# Patient Record
Sex: Female | Born: 1950 | Race: White | Hispanic: No | Smoking: Former smoker
Health system: Southern US, Community
[De-identification: ages and names within clinical notes are randomized; demographics above are authoritative.]

## PROBLEM LIST (undated history)

## (undated) DIAGNOSIS — E785 Hyperlipidemia, unspecified: Secondary | ICD-10-CM

## (undated) DIAGNOSIS — R569 Unspecified convulsions: Secondary | ICD-10-CM

## (undated) DIAGNOSIS — I619 Nontraumatic intracerebral hemorrhage, unspecified: Secondary | ICD-10-CM

## (undated) DIAGNOSIS — J45909 Unspecified asthma, uncomplicated: Secondary | ICD-10-CM

## (undated) DIAGNOSIS — E119 Type 2 diabetes mellitus without complications: Secondary | ICD-10-CM

## (undated) DIAGNOSIS — I639 Cerebral infarction, unspecified: Secondary | ICD-10-CM

## (undated) DIAGNOSIS — I10 Essential (primary) hypertension: Secondary | ICD-10-CM

## (undated) HISTORY — PX: BRAIN SURGERY: SHX531

## (undated) HISTORY — PX: CERVICAL FUSION: SHX112

---

## 2003-11-25 ENCOUNTER — Emergency Department: Payer: Self-pay | Admitting: Emergency Medicine

## 2003-11-25 ENCOUNTER — Ambulatory Visit: Payer: Self-pay | Admitting: Physician Assistant

## 2003-12-18 ENCOUNTER — Ambulatory Visit: Payer: Self-pay | Admitting: Pain Medicine

## 2003-12-26 ENCOUNTER — Ambulatory Visit: Payer: Self-pay | Admitting: Physician Assistant

## 2003-12-30 ENCOUNTER — Ambulatory Visit: Payer: Self-pay | Admitting: Pain Medicine

## 2004-02-18 ENCOUNTER — Ambulatory Visit: Payer: Self-pay | Admitting: Physician Assistant

## 2004-03-19 ENCOUNTER — Ambulatory Visit: Payer: Self-pay | Admitting: Pain Medicine

## 2004-04-02 ENCOUNTER — Ambulatory Visit: Payer: Self-pay | Admitting: Pain Medicine

## 2004-04-14 ENCOUNTER — Ambulatory Visit: Payer: Self-pay | Admitting: Pain Medicine

## 2004-04-27 ENCOUNTER — Ambulatory Visit: Payer: Self-pay | Admitting: Pain Medicine

## 2004-04-30 ENCOUNTER — Ambulatory Visit: Payer: Self-pay | Admitting: Pain Medicine

## 2004-05-04 ENCOUNTER — Ambulatory Visit: Payer: Self-pay | Admitting: Pain Medicine

## 2004-05-07 ENCOUNTER — Ambulatory Visit: Payer: Self-pay | Admitting: Pain Medicine

## 2004-05-25 ENCOUNTER — Ambulatory Visit: Payer: Self-pay | Admitting: Pain Medicine

## 2004-06-29 ENCOUNTER — Ambulatory Visit: Payer: Self-pay | Admitting: Pain Medicine

## 2004-08-05 ENCOUNTER — Ambulatory Visit: Payer: Self-pay | Admitting: Physician Assistant

## 2004-08-28 ENCOUNTER — Ambulatory Visit: Payer: Self-pay | Admitting: Physician Assistant

## 2004-10-01 ENCOUNTER — Ambulatory Visit: Payer: Self-pay | Admitting: Physician Assistant

## 2004-11-02 ENCOUNTER — Ambulatory Visit: Payer: Self-pay | Admitting: Physician Assistant

## 2004-11-30 ENCOUNTER — Ambulatory Visit: Payer: Self-pay | Admitting: Physician Assistant

## 2004-12-29 ENCOUNTER — Ambulatory Visit: Payer: Self-pay | Admitting: Physician Assistant

## 2005-01-27 ENCOUNTER — Ambulatory Visit: Payer: Self-pay | Admitting: Physician Assistant

## 2005-03-02 ENCOUNTER — Ambulatory Visit: Payer: Self-pay | Admitting: Physician Assistant

## 2005-04-06 ENCOUNTER — Ambulatory Visit: Payer: Self-pay | Admitting: Physician Assistant

## 2005-05-04 ENCOUNTER — Ambulatory Visit: Payer: Self-pay | Admitting: Physician Assistant

## 2005-06-02 ENCOUNTER — Ambulatory Visit: Payer: Self-pay | Admitting: Physician Assistant

## 2005-06-10 ENCOUNTER — Ambulatory Visit: Payer: Self-pay | Admitting: Pain Medicine

## 2005-06-17 ENCOUNTER — Ambulatory Visit: Payer: Self-pay | Admitting: Pain Medicine

## 2005-06-24 ENCOUNTER — Ambulatory Visit: Payer: Self-pay | Admitting: Pain Medicine

## 2005-07-27 ENCOUNTER — Ambulatory Visit: Payer: Self-pay | Admitting: Physician Assistant

## 2005-08-10 ENCOUNTER — Other Ambulatory Visit: Payer: Self-pay

## 2005-08-10 ENCOUNTER — Ambulatory Visit: Payer: Self-pay | Admitting: General Surgery

## 2005-08-13 ENCOUNTER — Ambulatory Visit: Payer: Self-pay | Admitting: General Surgery

## 2005-08-19 ENCOUNTER — Ambulatory Visit: Payer: Self-pay | Admitting: Pain Medicine

## 2005-08-30 ENCOUNTER — Ambulatory Visit: Payer: Self-pay | Admitting: Physician Assistant

## 2005-09-21 ENCOUNTER — Ambulatory Visit: Payer: Self-pay | Admitting: Pain Medicine

## 2005-10-07 ENCOUNTER — Ambulatory Visit: Payer: Self-pay | Admitting: Pain Medicine

## 2005-10-21 ENCOUNTER — Ambulatory Visit: Payer: Self-pay | Admitting: Pain Medicine

## 2005-11-16 ENCOUNTER — Ambulatory Visit: Payer: Self-pay | Admitting: Pain Medicine

## 2005-11-23 ENCOUNTER — Ambulatory Visit: Payer: Self-pay | Admitting: Pain Medicine

## 2005-12-02 ENCOUNTER — Ambulatory Visit: Payer: Self-pay | Admitting: Pain Medicine

## 2005-12-14 ENCOUNTER — Ambulatory Visit: Payer: Self-pay | Admitting: Pain Medicine

## 2005-12-23 ENCOUNTER — Ambulatory Visit: Payer: Self-pay | Admitting: Pain Medicine

## 2006-01-12 ENCOUNTER — Ambulatory Visit: Payer: Self-pay | Admitting: Physician Assistant

## 2006-01-18 ENCOUNTER — Ambulatory Visit: Payer: Self-pay | Admitting: Pain Medicine

## 2006-02-28 ENCOUNTER — Ambulatory Visit: Payer: Self-pay | Admitting: Pain Medicine

## 2006-03-28 ENCOUNTER — Ambulatory Visit: Payer: Self-pay | Admitting: Physician Assistant

## 2006-03-29 ENCOUNTER — Ambulatory Visit: Payer: Self-pay | Admitting: Physician Assistant

## 2006-04-07 ENCOUNTER — Ambulatory Visit: Payer: Self-pay | Admitting: Pain Medicine

## 2006-04-27 ENCOUNTER — Ambulatory Visit: Payer: Self-pay | Admitting: Physician Assistant

## 2006-05-26 ENCOUNTER — Ambulatory Visit: Payer: Self-pay | Admitting: Physician Assistant

## 2006-06-02 ENCOUNTER — Ambulatory Visit: Payer: Self-pay | Admitting: Pain Medicine

## 2006-06-09 ENCOUNTER — Ambulatory Visit: Payer: Self-pay | Admitting: Pain Medicine

## 2006-06-23 ENCOUNTER — Ambulatory Visit: Payer: Self-pay | Admitting: Pain Medicine

## 2006-06-30 ENCOUNTER — Ambulatory Visit: Payer: Self-pay | Admitting: Pain Medicine

## 2006-07-07 ENCOUNTER — Ambulatory Visit: Payer: Self-pay | Admitting: Pain Medicine

## 2006-07-26 ENCOUNTER — Ambulatory Visit: Payer: Self-pay | Admitting: Physician Assistant

## 2006-08-17 ENCOUNTER — Ambulatory Visit: Payer: Self-pay | Admitting: Physician Assistant

## 2006-08-17 ENCOUNTER — Ambulatory Visit: Payer: Self-pay | Admitting: Orthopedic Surgery

## 2006-09-26 ENCOUNTER — Ambulatory Visit: Payer: Self-pay | Admitting: Physician Assistant

## 2006-10-27 ENCOUNTER — Ambulatory Visit: Payer: Self-pay | Admitting: Physician Assistant

## 2006-11-10 ENCOUNTER — Ambulatory Visit: Payer: Self-pay | Admitting: Pain Medicine

## 2006-11-28 ENCOUNTER — Ambulatory Visit: Payer: Self-pay | Admitting: Physician Assistant

## 2006-12-01 ENCOUNTER — Ambulatory Visit: Payer: Self-pay | Admitting: Pain Medicine

## 2006-12-20 ENCOUNTER — Ambulatory Visit: Payer: Self-pay | Admitting: Physician Assistant

## 2007-01-03 ENCOUNTER — Ambulatory Visit: Payer: Self-pay | Admitting: Pain Medicine

## 2007-01-19 ENCOUNTER — Ambulatory Visit: Payer: Self-pay | Admitting: Pain Medicine

## 2007-01-26 ENCOUNTER — Ambulatory Visit: Payer: Self-pay | Admitting: Pain Medicine

## 2007-02-16 ENCOUNTER — Ambulatory Visit: Payer: Self-pay | Admitting: Pain Medicine

## 2007-03-16 ENCOUNTER — Ambulatory Visit: Payer: Self-pay | Admitting: Pain Medicine

## 2007-04-13 ENCOUNTER — Ambulatory Visit: Payer: Self-pay | Admitting: Physician Assistant

## 2007-05-16 ENCOUNTER — Ambulatory Visit: Payer: Self-pay | Admitting: Physician Assistant

## 2007-06-14 ENCOUNTER — Ambulatory Visit: Payer: Self-pay | Admitting: Physician Assistant

## 2007-07-12 ENCOUNTER — Ambulatory Visit: Payer: Self-pay | Admitting: Physician Assistant

## 2007-08-09 ENCOUNTER — Ambulatory Visit: Payer: Self-pay | Admitting: Physician Assistant

## 2007-08-17 ENCOUNTER — Ambulatory Visit: Payer: Self-pay | Admitting: Pain Medicine

## 2007-08-29 ENCOUNTER — Ambulatory Visit: Payer: Self-pay | Admitting: Pain Medicine

## 2007-09-05 ENCOUNTER — Ambulatory Visit: Payer: Self-pay | Admitting: Pain Medicine

## 2007-09-14 ENCOUNTER — Ambulatory Visit: Payer: Self-pay | Admitting: Pain Medicine

## 2007-09-28 ENCOUNTER — Ambulatory Visit: Payer: Self-pay | Admitting: Pain Medicine

## 2007-10-18 ENCOUNTER — Ambulatory Visit: Payer: Self-pay | Admitting: Pain Medicine

## 2007-11-06 ENCOUNTER — Ambulatory Visit: Payer: Self-pay | Admitting: Pain Medicine

## 2007-11-08 ENCOUNTER — Ambulatory Visit: Payer: Self-pay | Admitting: Pain Medicine

## 2007-11-16 ENCOUNTER — Ambulatory Visit: Payer: Self-pay | Admitting: Pain Medicine

## 2007-11-28 ENCOUNTER — Ambulatory Visit: Payer: Self-pay | Admitting: Pain Medicine

## 2007-12-13 ENCOUNTER — Ambulatory Visit: Payer: Self-pay | Admitting: Pain Medicine

## 2008-01-11 ENCOUNTER — Ambulatory Visit: Payer: Self-pay | Admitting: Physician Assistant

## 2008-01-15 ENCOUNTER — Ambulatory Visit: Payer: Self-pay | Admitting: Pain Medicine

## 2008-03-06 ENCOUNTER — Ambulatory Visit: Payer: Self-pay | Admitting: Pain Medicine

## 2008-04-02 ENCOUNTER — Ambulatory Visit: Payer: Self-pay | Admitting: Physician Assistant

## 2008-04-25 ENCOUNTER — Ambulatory Visit: Payer: Self-pay | Admitting: Physician Assistant

## 2008-06-03 ENCOUNTER — Ambulatory Visit: Payer: Self-pay | Admitting: Physician Assistant

## 2008-07-30 ENCOUNTER — Ambulatory Visit: Payer: Self-pay | Admitting: Pain Medicine

## 2008-08-13 ENCOUNTER — Ambulatory Visit: Payer: Self-pay | Admitting: Pain Medicine

## 2008-08-21 ENCOUNTER — Ambulatory Visit: Payer: Self-pay | Admitting: Pain Medicine

## 2008-08-27 ENCOUNTER — Ambulatory Visit: Payer: Self-pay | Admitting: Pain Medicine

## 2008-09-03 ENCOUNTER — Ambulatory Visit: Payer: Self-pay | Admitting: Pain Medicine

## 2008-09-10 ENCOUNTER — Ambulatory Visit: Payer: Self-pay | Admitting: Pain Medicine

## 2008-10-02 ENCOUNTER — Ambulatory Visit: Payer: Self-pay | Admitting: Physician Assistant

## 2008-11-04 ENCOUNTER — Ambulatory Visit: Payer: Self-pay | Admitting: Pain Medicine

## 2008-11-12 ENCOUNTER — Ambulatory Visit: Payer: Self-pay | Admitting: Pain Medicine

## 2008-12-04 ENCOUNTER — Ambulatory Visit: Payer: Self-pay | Admitting: Physician Assistant

## 2008-12-17 ENCOUNTER — Ambulatory Visit: Payer: Self-pay | Admitting: Pain Medicine

## 2009-01-07 ENCOUNTER — Ambulatory Visit: Payer: Self-pay | Admitting: Physician Assistant

## 2009-03-10 ENCOUNTER — Ambulatory Visit: Payer: Self-pay | Admitting: Pain Medicine

## 2009-04-01 ENCOUNTER — Ambulatory Visit: Payer: Self-pay | Admitting: Pain Medicine

## 2009-04-28 ENCOUNTER — Ambulatory Visit: Payer: Self-pay | Admitting: Pain Medicine

## 2009-08-07 ENCOUNTER — Ambulatory Visit: Payer: Self-pay | Admitting: Pain Medicine

## 2009-08-14 ENCOUNTER — Ambulatory Visit: Payer: Self-pay | Admitting: Pain Medicine

## 2010-04-08 ENCOUNTER — Emergency Department: Payer: Self-pay | Admitting: Emergency Medicine

## 2010-04-09 ENCOUNTER — Emergency Department: Payer: Self-pay | Admitting: Emergency Medicine

## 2010-04-10 ENCOUNTER — Emergency Department: Payer: Self-pay | Admitting: Emergency Medicine

## 2010-04-17 ENCOUNTER — Inpatient Hospital Stay: Payer: Self-pay | Admitting: Internal Medicine

## 2010-04-18 DIAGNOSIS — R4182 Altered mental status, unspecified: Secondary | ICD-10-CM

## 2010-04-18 DIAGNOSIS — R7989 Other specified abnormal findings of blood chemistry: Secondary | ICD-10-CM

## 2010-04-18 DIAGNOSIS — I10 Essential (primary) hypertension: Secondary | ICD-10-CM

## 2010-04-21 DIAGNOSIS — J9600 Acute respiratory failure, unspecified whether with hypoxia or hypercapnia: Secondary | ICD-10-CM

## 2010-11-09 ENCOUNTER — Ambulatory Visit: Payer: Self-pay | Admitting: Pain Medicine

## 2011-05-30 ENCOUNTER — Ambulatory Visit: Payer: Self-pay

## 2011-08-03 ENCOUNTER — Emergency Department: Payer: Self-pay | Admitting: *Deleted

## 2011-08-03 LAB — BASIC METABOLIC PANEL
Creatinine: 0.97 mg/dL (ref 0.60–1.30)
EGFR (African American): >60
Osmolality: 291 (ref 275–301)
Sodium: 143 mmol/L (ref 136–145)

## 2011-08-03 LAB — CBC
MCH: 32.8 pg (ref 26.0–34.0)
MCHC: 33.7 g/dL (ref 32.0–36.0)
RBC: 4.43 10*6/uL (ref 3.80–5.20)
RDW: 13 % (ref 11.5–14.5)
WBC: 9.2 10*3/uL (ref 3.6–11.0)

## 2011-08-03 LAB — TROPONIN I: Troponin-I: <0.02 ng/mL

## 2011-08-03 LAB — CK TOTAL AND CKMB (NOT AT ARMC): CK, Total: 49 U/L (ref 21–215)

## 2011-08-04 LAB — CK TOTAL AND CKMB (NOT AT ARMC): CK, Total: 53 U/L (ref 21–215)

## 2011-08-04 LAB — ETHANOL
Ethanol %: <0.003 % (ref 0.000–0.080)
Ethanol: <3 mg/dL

## 2011-08-04 LAB — TROPONIN I: Troponin-I: <0.02 ng/mL

## 2011-08-05 LAB — ACETAMINOPHEN LEVEL: Acetaminophen: <2 ug/mL

## 2011-08-05 LAB — DRUG SCREEN, URINE
Barbiturates, Ur Screen: NEGATIVE (ref ?–200)
Benzodiazepine, Ur Scrn: NEGATIVE (ref ?–200)
Cannabinoid 50 Ng, Ur ~~LOC~~: NEGATIVE (ref ?–50)
Methadone, Ur Screen: NEGATIVE (ref ?–300)
Opiate, Ur Screen: NEGATIVE (ref ?–300)
Phencyclidine (PCP) Ur S: NEGATIVE (ref ?–25)
Tricyclic, Ur Screen: NEGATIVE (ref ?–1000)

## 2011-08-05 LAB — URINALYSIS, COMPLETE
Ketone: NEGATIVE
Protein: NEGATIVE
Specific Gravity: 1.008 (ref 1.003–1.030)
Squamous Epithelial: 7
WBC UR: 15 /HPF (ref 0–5)

## 2011-08-05 LAB — COMPREHENSIVE METABOLIC PANEL
Albumin: 4 g/dL (ref 3.4–5.0)
Alkaline Phosphatase: 116 U/L (ref 50–136)
Anion Gap: 9 (ref 7–16)
BUN: 11 mg/dL (ref 7–18)
Bilirubin,Total: 0.3 mg/dL (ref 0.2–1.0)
Chloride: 108 mmol/L — ABNORMAL HIGH (ref 98–107)
Co2: 25 mmol/L (ref 21–32)
EGFR (African American): >60
EGFR (Non-African Amer.): 59 — ABNORMAL LOW
Glucose: 111 mg/dL — ABNORMAL HIGH (ref 65–99)
Sodium: 142 mmol/L (ref 136–145)
Total Protein: 7.6 g/dL (ref 6.4–8.2)

## 2011-08-05 LAB — CBC
MCH: 32.2 pg (ref 26.0–34.0)
MCHC: 33 g/dL (ref 32.0–36.0)
MCV: 98 fL (ref 80–100)
Platelet: 222 10*3/uL (ref 150–440)
RBC: 4.34 10*6/uL (ref 3.80–5.20)
WBC: 8.3 10*3/uL (ref 3.6–11.0)

## 2011-08-05 LAB — ETHANOL: Ethanol: <3 mg/dL

## 2011-08-05 LAB — TSH: Thyroid Stimulating Horm: 6.26 u[IU]/mL — ABNORMAL HIGH

## 2011-08-06 ENCOUNTER — Inpatient Hospital Stay: Payer: Self-pay | Admitting: Psychiatry

## 2011-08-07 LAB — BASIC METABOLIC PANEL
Anion Gap: 8 (ref 7–16)
Calcium, Total: 7.8 mg/dL — ABNORMAL LOW (ref 8.5–10.1)
Co2: 24 mmol/L (ref 21–32)
Creatinine: 1.67 mg/dL — ABNORMAL HIGH (ref 0.60–1.30)
EGFR (African American): 38 — ABNORMAL LOW
EGFR (Non-African Amer.): 33 — ABNORMAL LOW
Osmolality: 282 (ref 275–301)
Potassium: 3.7 mmol/L (ref 3.5–5.1)
Sodium: 139 mmol/L (ref 136–145)

## 2011-08-08 LAB — CBC WITH DIFFERENTIAL/PLATELET
Basophil #: 0 10*3/uL (ref 0.0–0.1)
Eosinophil %: 3.9 %
HCT: 38.6 % (ref 35.0–47.0)
Lymphocyte #: 2.5 10*3/uL (ref 1.0–3.6)
MCH: 32.6 pg (ref 26.0–34.0)
MCHC: 33.4 g/dL (ref 32.0–36.0)
MCV: 98 fL (ref 80–100)
Monocyte #: 0.7 x10 3/mm (ref 0.2–0.9)
Monocyte %: 9.8 %
Neutrophil #: 3.3 10*3/uL (ref 1.4–6.5)
Platelet: 194 10*3/uL (ref 150–440)
RDW: 12.7 % (ref 11.5–14.5)
WBC: 6.7 10*3/uL (ref 3.6–11.0)

## 2011-08-08 LAB — URINALYSIS, COMPLETE
Blood: NEGATIVE
Hyaline Cast: 1
Ketone: NEGATIVE
Nitrite: NEGATIVE
RBC,UR: 4 /HPF (ref 0–5)
WBC UR: 11 /HPF (ref 0–5)

## 2011-08-08 LAB — BASIC METABOLIC PANEL
Anion Gap: 9 (ref 7–16)
BUN: 15 mg/dL (ref 7–18)
Calcium, Total: 8.1 mg/dL — ABNORMAL LOW (ref 8.5–10.1)
Co2: 23 mmol/L (ref 21–32)
Creatinine: 0.97 mg/dL (ref 0.60–1.30)
EGFR (African American): >60
Glucose: 111 mg/dL — ABNORMAL HIGH (ref 65–99)
Sodium: 142 mmol/L (ref 136–145)

## 2011-08-08 LAB — HEMOGLOBIN A1C: Hemoglobin A1C: 6.1 % (ref 4.2–6.3)

## 2011-08-09 LAB — LIPID PANEL
Cholesterol: 102 mg/dL (ref 0–200)
Ldl Cholesterol, Calc: 49 mg/dL (ref 0–100)

## 2011-08-09 LAB — FOLATE: Folic Acid: 16 ng/mL (ref 3.1–100.0)

## 2011-08-18 LAB — URINALYSIS, COMPLETE
Bacteria: NONE SEEN
Bilirubin,UR: NEGATIVE
Blood: NEGATIVE
Glucose,UR: NEGATIVE mg/dL (ref 0–75)
Ketone: NEGATIVE
Ph: 5 (ref 4.5–8.0)
RBC,UR: 2 /HPF (ref 0–5)
Specific Gravity: 1.017 (ref 1.003–1.030)
Squamous Epithelial: 3

## 2012-11-28 DIAGNOSIS — E039 Hypothyroidism, unspecified: Secondary | ICD-10-CM | POA: Diagnosis present
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
## 2014-06-02 NOTE — Consult Note (Signed)
Psychological Assessment  Tammy Leighton60of Evaluation: 7-3-13Administered: Hessie DienerBender Gestalt  [employer] for Referral: Tammy Shepard was referred for a psychological assessment by her physician, Caryn SectionAarti Kapur, MD.  She was admitted to Behavioral Medicine for the treatment increasing depression and suicidal ideation. She experienced a stroke about a year ago and has little use of her left extremities.  Please see the history and physical and psychosocial history for further background information. A neuropsychological assessment with a focus on memory was requested. A discussion with the physician regarding the available testing instruments resulted in a decision to attempt a global neuropsychological screening.  During the testing process Tammy Shepard reported her difficulties with vision, having "cuts" in her left visual field in both eyes. As the testing materials require adequate vision for valid results testing was discontinued.    Electronic Signatures: Carola Frostoush, Lee Ann (PsyD, HSP-P)  (Signed on 03-Jul-13 09:46)  Authored  Last Updated: 03-Jul-13 09:46 by Carola Frostoush, Lee Ann (PsyD, HSP-P)

## 2014-06-02 NOTE — Consult Note (Signed)
PATIENT NAME:  Tammy Shepard, Tammy Shepard MR#:  [id number] DATE OF BIRTH:  1950-05-24  DATE OF CONSULTATION:  08/07/2011  REFERRING PHYSICIAN:  Antonietta Breach, MD CONSULTING PHYSICIAN:  Shaune Pollack, MD  PRIMARY CARE PHYSICIAN: None local.  REASON FOR CONSULTATION: Hypotension.  HISTORY OF PRESENT ILLNESS: The patient is a 64 year old Caucasian female with a history of hypertension, diabetes, depression, CVA last year, hypothyroidism, and seizure disorder who was admitted for major depression. The patient has been on four hypertension medications. She feels dizzy, weak, and has a headache today, and was noted to have a low blood pressure, 84/60, after the patient took hypertension medication, at 9:00 a.m. today. However, the patient denies any chest pain, palpitations, orthopnea, or nocturnal dyspnea. No abdominal pain, nausea, vomiting, or diarrhea. No melena or bloody stool. The patient has urinary frequency, but no dysuria. She was found to have a urinary tract infection yesterday and was treated with Cipro, however, she developed a rash and itching so Cipro was stopped due to allergic reaction.   SOCIAL HISTORY: The patient quit smoking two years ago. No alcohol drinking or illicit drugs.   PAST SURGICAL HISTORY: Back, neck, and knee surgery.   FAMILY HISTORY: Hypertension, diabetes, and stroke.   REVIEW OF SYSTEMS: CONSTITUTIONAL: The patient denies any fever or chills, but has headache, dizziness, and weakness. EYES: No double vision or blurred vision. ENT: No epistaxis, dysphagia, or slurred speech. No postnasal drip. CARDIOVASCULAR: No chest pain, palpitations, orthopnea, or nocturnal dyspnea. GI: No abdominal pain, nausea, vomiting, or diarrhea. No melena or bloody stool. PULMONARY: No cough, sputum, shortness of breath, or hemoptysis. MUSCULOSKELETAL: No musculoskeletal pain or joint pain. HEMATOLOGY: No easy bruising or bleeding. SKIN: Rash mostly in the abdomen. No jaundice. NEUROLOGY: No syncope,  loss of consciousness, or seizure. GU: Urinary frequency, but no dysuria or hematuria or incontinence.   PHYSICAL EXAMINATION:   VITAL SIGNS: Temperature 98.2, blood pressure 84/60, and heart rate 112.   GENERAL: The patient is alert, awake and oriented, in no acute distress.   HEENT: Pupils are equal, round and reactive to light and accommodation. Moist oral mucosa. Clear oropharynx.   NECK: Supple. No JVD or carotid bruit. No lymphadenopathy. No thyromegaly.   CARDIOVASCULAR: S1 and S2 regular rate and rhythm. No murmurs or gallops.   PULMONARY: Bilateral air entry. No wheezing or rales.   ABDOMEN: Obese. Diffuse rash on the abdomen. No tenderness. No distention. Bowel sounds present. No organomegaly.   EXTREMITIES: No edema, clubbing, or cyanosis. No calf tenderness.   SKIN: There is a rash over the abdomen mostly, but there is a scattered rash on the legs.   NEURO: Alert and oriented x3. No focal deficits. Power - left side 3/5, right side 5/5. Sensation intact.   LABORATORY DATA: Labs two days ago with WBC 8.3, hemoglobin 14.0, and platelet 222. Glucose 111, BUN 11, creatinine 1.04, sodium 142, potassium 3.6, chloride 108, and bicarbonate 25.   Urinalysis two days ago showed WBC 15 and RBC 2.  IMPRESSION:  1. Hypotension, possibly due to hypertension medication.  2. Tachycardia, possibly due to hypotension.  3. Allergic reaction to Cipro.  4. Urinary tract infection.  5. Diabetes. 6.   CVA   RECOMMENDATIONS:  1. Hold hypertension medication including Norvasc, Cozaar, Altace, and clonidine.  In addition to holding hypertension medication, pain medication should be given cautiously. 2. Give normal saline 500 mL bolus then change to 100 mL/h, if blood pressure is stable, and followup BMP in the morning.  3. Hold Cipro and give Benadryl p.r.n. for itch.  4. We will repeat urinalysis, but hold antibiotics at this time due to multiple allergic reaction to antibiotics.    I  discussed situation and the recommendations with the patient and the nurse.   TIME SPENT: About 55 minutes.  ____________________________ Shaune PollackQing Andrus Sharp, MD qc:slb D: 08/07/2011 13:59:08 ET T: 08/07/2011 14:51:24 ET JOB#: [id number]  cc: Shaune PollackQing Ambri Miltner, MD, <Dictator> Adelene AmasJames S. Williford, MD Shaune PollackQING Donella Pascarella MD ELECTRONICALLY SIGNED 08/08/2011 15:41

## 2014-06-02 NOTE — Consult Note (Signed)
PATIENT NAME:  Tammy Shepard, Serenity S MR#:  [id number] DATE OF BIRTH:  04-Oct-1950  DATE OF CONSULTATION:  08/07/2011  REFERRING PHYSICIAN:   CONSULTING PHYSICIAN:  Shaune PollackQing Koriana Stepien, MD  ADDENDUM   ALLERGIES: Baclofen, Betadine, Cipro, clindamycin, codeine, contrast iodine, erythromycin, penicillin, sulfa drugs, latex, tape.   CURRENT MEDICATIONS: 1. Acetaminophen 650 mg p.o. q.4 hours p.r.n.  2. Antacid double strength suspension 30 mL p.o. q.4 hours p.r.n. for indigestion.  3. Norvasc 10 mg p.o. daily.  4. Zocor 40 mg p.o. daily.  5. Clonidine 0.1 mg p.o. b.i.d.  6. Depakote 250 mg p.o. at bedtime.  7. Lexapro 10 mg p.o. daily.  8. Synthroid 0.025 mg p.o. at 6 a.m.  9. Ativan 0.5 mg p.o. at bedtime.  10. Cozaar 100 mg p.o. daily.  11. Milk of Magnesia 30 mL p.o. at bedtime p.r.n. for constipation.  12. Metformin 500 mg p.o. b.i.d.  13. OxyContin ER 10 mg every 12 hours.  14. Oxycodone 5 mg p.o. q.6h. p.r.n.  15. Altace 10 mg p.o. daily.  16. Trazodone 150 mg p.o. at bedtime.  17. Hydrocortisone 2.5% cream apply to affected area t.i.d.  18. Seroquel 25 to 50 milligrams p.o. b.i.d. p.r.n. for anxiety.  ____________________________ Shaune PollackQing Adib Wahba, MD qc:ap D: 08/07/2011 14:05:34 ET                 T: 08/07/2011 14:47:29 ET JOB#: [id number] cc: Shaune PollackQing Dao Mearns, MD, <Dictator> Shaune PollackQING Salima Rumer MD ELECTRONICALLY SIGNED 08/08/2011 15:35

## 2014-06-02 NOTE — Consult Note (Signed)
Brief Consult Note: Diagnosis: major depression.   Patient was seen by consultant.   Recommend further assessment or treatment.   Orders entered.   Comments: Psychiatry: Patient seen. Admit to psychiatry. See full H&P. Orders done.  Electronic Signatures: Audery Amellapacs, Katrin Grabel T (MD)  (Signed 28-Jun-13 16:44)  Authored: Brief Consult Note   Last Updated: 28-Jun-13 16:44 by Audery Amellapacs, Gregg Holster T (MD)

## 2014-06-29 DIAGNOSIS — L98429 Non-pressure chronic ulcer of back with unspecified severity: Secondary | ICD-10-CM | POA: Diagnosis present

## 2015-08-31 ENCOUNTER — Encounter: Payer: Self-pay | Admitting: *Deleted

## 2015-08-31 ENCOUNTER — Emergency Department
Admission: EM | Admit: 2015-08-31 | Discharge: 2015-08-31 | Disposition: A | Discharge status: Home / Self Care | Payer: Medicare Other | Attending: Emergency Medicine | Admitting: Emergency Medicine

## 2015-08-31 DIAGNOSIS — J45909 Unspecified asthma, uncomplicated: Secondary | ICD-10-CM | POA: Insufficient documentation

## 2015-08-31 DIAGNOSIS — N390 Urinary tract infection, site not specified: Secondary | ICD-10-CM | POA: Insufficient documentation

## 2015-08-31 DIAGNOSIS — I509 Heart failure, unspecified: Secondary | ICD-10-CM | POA: Insufficient documentation

## 2015-08-31 DIAGNOSIS — E119 Type 2 diabetes mellitus without complications: Secondary | ICD-10-CM | POA: Insufficient documentation

## 2015-08-31 DIAGNOSIS — I110 Hypertensive heart disease with heart failure: Secondary | ICD-10-CM | POA: Insufficient documentation

## 2015-08-31 DIAGNOSIS — Z87891 Personal history of nicotine dependence: Secondary | ICD-10-CM | POA: Diagnosis not present

## 2015-08-31 DIAGNOSIS — R109 Unspecified abdominal pain: Secondary | ICD-10-CM | POA: Diagnosis present

## 2015-08-31 HISTORY — DX: Hyperlipidemia, unspecified: E78.5

## 2015-08-31 HISTORY — DX: Type 2 diabetes mellitus without complications: E11.9

## 2015-08-31 HISTORY — DX: Essential (primary) hypertension: I10

## 2015-08-31 HISTORY — DX: Unspecified asthma, uncomplicated: J45.909

## 2015-08-31 HISTORY — DX: Cerebral infarction, unspecified: I63.9

## 2015-08-31 HISTORY — DX: Nontraumatic intracerebral hemorrhage, unspecified: I61.9

## 2015-08-31 LAB — COMPREHENSIVE METABOLIC PANEL
ALBUMIN: 4.3 g/dL (ref 3.5–5.0)
ALT: 19 U/L (ref 14–54)
ANION GAP: 7 (ref 5–15)
AST: 16 U/L (ref 15–41)
Alkaline Phosphatase: 109 U/L (ref 38–126)
BUN: 21 mg/dL — ABNORMAL HIGH (ref 6–20)
CO2: 28 mmol/L (ref 22–32)
Calcium: 9.3 mg/dL (ref 8.9–10.3)
Chloride: 104 mmol/L (ref 101–111)
Creatinine, Ser: 1.22 mg/dL — ABNORMAL HIGH (ref 0.44–1.00)
GFR calc Af Amer: 53 mL/min — ABNORMAL LOW (ref >60)
GFR calc non Af Amer: 46 mL/min — ABNORMAL LOW (ref >60)
GLUCOSE: 137 mg/dL — AB (ref 65–99)
POTASSIUM: 4 mmol/L (ref 3.5–5.1)
SODIUM: 139 mmol/L (ref 135–145)
Total Bilirubin: 0.5 mg/dL (ref 0.3–1.2)
Total Protein: 8.1 g/dL (ref 6.5–8.1)

## 2015-08-31 LAB — URINALYSIS COMPLETE WITH MICROSCOPIC (ARMC ONLY)
Bilirubin Urine: NEGATIVE
Glucose, UA: 150 mg/dL — AB
Ketones, ur: NEGATIVE mg/dL
Nitrite: POSITIVE — AB
PH: 6 (ref 5.0–8.0)
Protein, ur: 100 mg/dL — AB
Specific Gravity, Urine: 1.018 (ref 1.005–1.030)

## 2015-08-31 LAB — CBC
HEMATOCRIT: 44 % (ref 35.0–47.0)
HEMOGLOBIN: 14.7 g/dL (ref 12.0–16.0)
MCH: 31.7 pg (ref 26.0–34.0)
MCHC: 33.5 g/dL (ref 32.0–36.0)
MCV: 94.5 fL (ref 80.0–100.0)
Platelets: 238 10*3/uL (ref 150–440)
RBC: 4.66 MIL/uL (ref 3.80–5.20)
RDW: 13.5 % (ref 11.5–14.5)
WBC: 11.3 10*3/uL — ABNORMAL HIGH (ref 3.6–11.0)

## 2015-08-31 MED ORDER — OXYCODONE-ACETAMINOPHEN 5-325 MG PO TABS
1.0000 | ORAL_TABLET | ORAL | Status: AC
Start: 2015-08-31 — End: 2015-08-31
  Administered 2015-08-31: 1 via ORAL
  Filled 2015-08-31: qty 1

## 2015-08-31 MED ORDER — CIPROFLOXACIN HCL 500 MG PO TABS: 500.0000 mg | ORAL_TABLET | Freq: Two times a day (BID) | ORAL | 0 refills | Status: AC

## 2015-08-31 MED ORDER — DIPHENHYDRAMINE HCL 25 MG PO CAPS
50.0000 mg | ORAL_CAPSULE | Freq: Once | ORAL | Status: AC
  Administered 2015-08-31: 50 mg via ORAL

## 2015-08-31 MED ORDER — NITROFURANTOIN MONOHYD MACRO 100 MG PO CAPS
100.0000 mg | ORAL_CAPSULE | Freq: Two times a day (BID) | ORAL | 0 refills | Status: AC
Start: 2015-08-31 — End: 2015-09-05

## 2015-08-31 MED ORDER — CIPROFLOXACIN IN D5W 400 MG/200ML IV SOLN
400.0000 mg | Freq: Once | INTRAVENOUS | Status: AC
  Administered 2015-08-31: 400 mg via INTRAVENOUS
  Filled 2015-08-31: qty 200

## 2015-08-31 MED ORDER — DIPHENHYDRAMINE HCL 25 MG PO CAPS
ORAL_CAPSULE | ORAL | Status: AC
  Administered 2015-08-31: 50 mg via ORAL
  Filled 2015-08-31: qty 2

## 2015-08-31 NOTE — ED Notes (Signed)
Removed previous 82Fr foley cathter and leg bag. New 82F/78mL Foley placed. Hazy yellow urine returned. Pt states she feels better after cath change.

## 2015-08-31 NOTE — ED Notes (Addendum)
Called to room by family who states pt has urticaria and a rash after starting Cipro. Pt noted to have slight pink rash to abdomen. Pt states she is allergic to multiple antibiotics and this is how the reactions usually start out. MD notified. IV stopped and disconnected. No difficulty breathing, no swelling noted.

## 2015-08-31 NOTE — ED Provider Notes (Signed)
[facility] Emergency Department Provider Npresents to the emergency department today because of concerns for flank pain.ote    ____________________________________________   I have reviewed the triage vital signs and the nursing notes.   HISTORY  Chief Complaint Flank Pain   History limited by: Not Limited   HPI Tammy Shepard is a 65 y.o. female presents to the emergency department today because of concerns for flank pain. It is worse in the right flank. It started this morning. It has been constant since then. She describes it as sharp. She has noticed that her urine has gotten darker. She does have a catheter in place secondary to urinary retention. This was placed 11 days ago at [facility]. Patient has had frequent UTIs in the past. She denies any fevers. Denies any associated nausea or vomiting.     Past Medical History:  Diagnosis Date  . Asthma   . CHF (congestive heart failure) (HCC)   . CVA (cerebrovascular accident due to intracerebral hemorrhage) (HCC)   . Diabetes mellitus without complication (HCC)   . Hyperlipemia   . Hypertension   . Stroke [facility])     There are no active problems to display for this patient.   Past Surgical History:  Procedure Laterality Date  . CERVICAL FUSION        Allergies Penicillins and Codeine  No family history on file.  Social History Social History  Substance Use Topics  . Smoking status: Former Games developer  . Smokeless tobacco: Never Used  . Alcohol use No    Review of Systems  Constitutional: Negative for fever. Cardiovascular: Negative for chest pain. Respiratory: Negative for shortness of breath. Gastrointestinal: Positive for bilateral flank pain, right greater than left Genitourinary: Positive for darkening urine Neurological: Negative for headaches, focal weakness or numbness.  10-point ROS otherwise negative.  ____________________________________________   PHYSICAL  EXAM:  VITAL SIGNS: ED Triage Vitals  Enc Vitals Group     BP 08/31/15 1303 (!) 100/54     Pulse Rate 08/31/15 1303 (!) 56     Resp 08/31/15 1303 18     Temp 08/31/15 1303 98.2 F (36.8 C)     Temp Source 08/31/15 1303 Axillary     SpO2 08/31/15 1303 97 %     Weight 08/31/15 1303 175 lb (79.4 kg)     Height 08/31/15 1303 5\' 4"  (1.626 m)     Head Circumference --      Peak Flow --      Pain Score 08/31/15 1307 8   Constitutional: Alert and oriented. Well appearing and in no distress. Eyes: Conjunctivae are normal. PERRL. Normal extraocular movements. ENT   Head: Normocephalic and atraumatic.   Nose: No congestion/rhinnorhea.   Mouth/Throat: Mucous membranes are moist.   Neck: No stridor. Hematological/Lymphatic/Immunilogical: No cervical lymphadenopathy. Cardiovascular: Normal rate, regular rhythm.  No murmurs, rubs, or gallops. Respiratory: Normal respiratory effort without tachypnea nor retractions. Breath sounds are clear and equal bilaterally. No wheezes/rales/rhonchi. Gastrointestinal: Soft and nontender. No distention. There is no CVA tenderness. Genitourinary: Catheter in place. Musculoskeletal: Normal range of motion in all extremities. No joint effusions.  No lower extremity tenderness nor edema. Neurologic:  Normal speech and language. No gross focal neurologic deficits are appreciated.  Skin:  Skin is warm, dry and intact. No rash noted. Psychiatric: Mood and affect are normal. Speech and behavior are normal. Patient exhibits appropriate insight and judgment.  ____________________________________________    LABS (pertinent positives/negatives)  Labs Reviewed  COMPREHENSIVE  METABOLIC PANEL - Abnormal; Notable for the following:       Result Value   Glucose, Bld 137 (*)    BUN 21 (*)    Creatinine, Ser 1.22 (*)    GFR calc non Af Amer 46 (*)    GFR calc Af Amer 53 (*)    All other components within normal limits  CBC - Abnormal; Notable for the  following:    WBC 11.3 (*)    All other components within normal limits  URINALYSIS COMPLETEWITH MICROSCOPIC ([facility] ONLY) - Abnormal; Notable for the following:    Color, Urine AMBER (*)    APPearance CLOUDY (*)    Glucose, UA 150 (*)    Hgb urine dipstick 2+ (*)    Protein, ur 100 (*)    Nitrite POSITIVE (*)    Leukocytes, UA 2+ (*)    Bacteria, UA RARE (*)    Squamous Epithelial / LPF 0-5 (*)    All other components within normal limits  URINE CULTURE     ____________________________________________   EKG  None  ____________________________________________    RADIOLOGY  None  ____________________________________________   PROCEDURES  Procedures  ____________________________________________   INITIAL IMPRESSION / ASSESSMENT AND PLAN / ED COURSE  Pertinent labs & imaging results that were available during my care of the patient were reviewed by me and considered in my medical decision making (see chart for details).  Patient presented to the emergency department today because of concerns for flank pain in the setting of a catheter placed 11 days ago. Due to concern for infection. Will switch catheters and send urine for urinalysis and culture.  Clinical Course  Comment By Time  Patient's urine is consistent with urinary tract infection. Phineas Semen, MD 07/23 1715   Will give dose of abx here and prescription for antibiotics. Will have patient follow up with her urologist. ____________________________________________   FINAL CLINICAL IMPRESSION(S) / ED DIAGNOSES  Final diagnoses:  UTI (lower urinary tract infection)     Note: This dictation was prepared with Dragon dictation. Any transcriptional errors that result from this process are unintentional    Phineas Semen, MD 08/31/15 1737

## 2015-08-31 NOTE — ED Triage Notes (Signed)
Per patient's daughter, patient c/o right flank pain since last night. Patient has an indwelling catheter that placed approximately 10 days ago for retention. Patient is with one daughter during the week and the other daughter on the weekends.

## 2015-08-31 NOTE — Discharge Instructions (Signed)
Please seek medical attention for any high fevers, chest pain, shortness of breath, change in behavior, persistent vomiting, bloody stool or any other new or concerning symptoms.  

## 2015-08-31 NOTE — ED Notes (Signed)

## 2015-09-03 LAB — URINE CULTURE

## 2015-09-04 NOTE — Progress Notes (Signed)
Patient visited ED 08/31/15 with a final diagnosis of UTI. Patient was discharged on nitrofurantoin 100 mg capsule bid x 5 days. Urine culture report on 07/26 reveals >100,000 colonies Ecoli sensitive to nitrofurantoin. Spoke with patient's daughter and she is feeling much better. No medication changes needed at this time.  Horris Latino, PharmD Pharmacy Resident 09/04/2015 3:31 PM

## 2015-10-31 ENCOUNTER — Emergency Department (HOSPITAL_COMMUNITY): Payer: Medicare Other

## 2015-10-31 ENCOUNTER — Inpatient Hospital Stay (HOSPITAL_COMMUNITY)
Admission: EM | Admit: 2015-10-31 | Discharge: 2015-11-07 | DRG: 690 | Disposition: A | Discharge status: Skilled Nursing Facility | Payer: Medicare Other | Attending: Internal Medicine | Admitting: Internal Medicine

## 2015-10-31 ENCOUNTER — Encounter (HOSPITAL_COMMUNITY): Payer: Self-pay | Admitting: *Deleted

## 2015-10-31 DIAGNOSIS — I959 Hypotension, unspecified: Secondary | ICD-10-CM | POA: Diagnosis present

## 2015-10-31 DIAGNOSIS — N390 Urinary tract infection, site not specified: Secondary | ICD-10-CM | POA: Diagnosis not present

## 2015-10-31 DIAGNOSIS — Z993 Dependence on wheelchair: Secondary | ICD-10-CM

## 2015-10-31 DIAGNOSIS — I10 Essential (primary) hypertension: Secondary | ICD-10-CM | POA: Diagnosis present

## 2015-10-31 DIAGNOSIS — R509 Fever, unspecified: Secondary | ICD-10-CM

## 2015-10-31 DIAGNOSIS — Z791 Long term (current) use of non-steroidal anti-inflammatories (NSAID): Secondary | ICD-10-CM

## 2015-10-31 DIAGNOSIS — Z7984 Long term (current) use of oral hypoglycemic drugs: Secondary | ICD-10-CM

## 2015-10-31 DIAGNOSIS — E039 Hypothyroidism, unspecified: Secondary | ICD-10-CM | POA: Diagnosis present

## 2015-10-31 DIAGNOSIS — Z981 Arthrodesis status: Secondary | ICD-10-CM

## 2015-10-31 DIAGNOSIS — R531 Weakness: Secondary | ICD-10-CM

## 2015-10-31 DIAGNOSIS — Z87440 Personal history of urinary (tract) infections: Secondary | ICD-10-CM

## 2015-10-31 DIAGNOSIS — E119 Type 2 diabetes mellitus without complications: Secondary | ICD-10-CM | POA: Diagnosis present

## 2015-10-31 DIAGNOSIS — Z79891 Long term (current) use of opiate analgesic: Secondary | ICD-10-CM

## 2015-10-31 DIAGNOSIS — Z79899 Other long term (current) drug therapy: Secondary | ICD-10-CM

## 2015-10-31 DIAGNOSIS — Z880 Allergy status to penicillin: Secondary | ICD-10-CM

## 2015-10-31 DIAGNOSIS — I69354 Hemiplegia and hemiparesis following cerebral infarction affecting left non-dominant side: Secondary | ICD-10-CM

## 2015-10-31 DIAGNOSIS — E785 Hyperlipidemia, unspecified: Secondary | ICD-10-CM | POA: Diagnosis present

## 2015-10-31 DIAGNOSIS — Z87891 Personal history of nicotine dependence: Secondary | ICD-10-CM

## 2015-10-31 DIAGNOSIS — Z8673 Personal history of transient ischemic attack (TIA), and cerebral infarction without residual deficits: Secondary | ICD-10-CM

## 2015-10-31 DIAGNOSIS — Z7982 Long term (current) use of aspirin: Secondary | ICD-10-CM

## 2015-10-31 DIAGNOSIS — B9620 Unspecified Escherichia coli [E. coli] as the cause of diseases classified elsewhere: Secondary | ICD-10-CM | POA: Diagnosis present

## 2015-10-31 LAB — URINALYSIS, ROUTINE W REFLEX MICROSCOPIC
Bilirubin Urine: NEGATIVE
Glucose, UA: NEGATIVE mg/dL
Ketones, ur: 15 mg/dL — AB
Nitrite: POSITIVE — AB
PROTEIN: 30 mg/dL — AB
Specific Gravity, Urine: 1.021 (ref 1.005–1.030)
pH: 7 (ref 5.0–8.0)

## 2015-10-31 LAB — COMPREHENSIVE METABOLIC PANEL
ALBUMIN: 3 g/dL — AB (ref 3.5–5.0)
ALK PHOS: 70 U/L (ref 38–126)
ALT: 17 U/L (ref 14–54)
AST: 14 U/L — AB (ref 15–41)
Anion gap: 5 (ref 5–15)
BILIRUBIN TOTAL: 0.7 mg/dL (ref 0.3–1.2)
BUN: 21 mg/dL — AB (ref 6–20)
CO2: 25 mmol/L (ref 22–32)
CREATININE: 1.19 mg/dL — AB (ref 0.44–1.00)
Calcium: 8.7 mg/dL — ABNORMAL LOW (ref 8.9–10.3)
Chloride: 106 mmol/L (ref 101–111)
GFR calc Af Amer: 55 mL/min — ABNORMAL LOW (ref >60)
GFR, EST NON AFRICAN AMERICAN: 48 mL/min — AB (ref >60)
GLUCOSE: 159 mg/dL — AB (ref 65–99)
POTASSIUM: 4.2 mmol/L (ref 3.5–5.1)
Sodium: 136 mmol/L (ref 135–145)
TOTAL PROTEIN: 6.1 g/dL — AB (ref 6.5–8.1)

## 2015-10-31 LAB — CBC WITH DIFFERENTIAL/PLATELET
Basophils Absolute: 0 10*3/uL (ref 0.0–0.1)
Basophils Relative: 0 %
EOS PCT: 0 %
Eosinophils Absolute: 0 10*3/uL (ref 0.0–0.7)
HCT: 37.6 % (ref 36.0–46.0)
HEMOGLOBIN: 12.2 g/dL (ref 12.0–15.0)
LYMPHS PCT: 10 %
Lymphs Abs: 1.2 10*3/uL (ref 0.7–4.0)
MCH: 31.4 pg (ref 26.0–34.0)
MCHC: 32.4 g/dL (ref 30.0–36.0)
MCV: 96.7 fL (ref 78.0–100.0)
MONOS PCT: 12 %
Monocytes Absolute: 1.4 10*3/uL — ABNORMAL HIGH (ref 0.1–1.0)
NEUTROS PCT: 78 %
Neutro Abs: 8.9 10*3/uL — ABNORMAL HIGH (ref 1.7–7.7)
Platelets: 165 10*3/uL (ref 150–400)
RBC: 3.89 MIL/uL (ref 3.87–5.11)
RDW: 12.7 % (ref 11.5–15.5)
WBC: 11.5 10*3/uL — AB (ref 4.0–10.5)

## 2015-10-31 LAB — I-STAT BETA HCG BLOOD, ED (MC, WL, AP ONLY): I-stat hCG, quantitative: <5 m[IU]/mL (ref <5)

## 2015-10-31 LAB — URINE MICROSCOPIC-ADD ON

## 2015-10-31 LAB — I-STAT CG4 LACTIC ACID, ED: Lactic Acid, Venous: 1.2 mmol/L (ref 0.5–1.9)

## 2015-10-31 LAB — CBG MONITORING, ED: Glucose-Capillary: 158 mg/dL — ABNORMAL HIGH (ref 65–99)

## 2015-10-31 MED ORDER — CLONAZEPAM 0.5 MG PO TABS
0.5000 mg | ORAL_TABLET | Freq: Two times a day (BID) | ORAL | Status: DC | PRN
  Administered 2015-11-01 – 2015-11-02 (×2): 0.5 mg via ORAL
  Filled 2015-10-31 (×2): qty 1

## 2015-10-31 MED ORDER — TRAZODONE HCL 100 MG PO TABS
200.0000 mg | ORAL_TABLET | Freq: Every day | ORAL | Status: DC
  Administered 2015-11-01 – 2015-11-06 (×7): 200 mg via ORAL
  Filled 2015-10-31 (×7): qty 2

## 2015-10-31 MED ORDER — MELOXICAM 7.5 MG PO TABS
15.0000 mg | ORAL_TABLET | Freq: Every day | ORAL | Status: DC
  Administered 2015-11-01 – 2015-11-07 (×7): 15 mg via ORAL
  Filled 2015-10-31 (×7): qty 2

## 2015-10-31 MED ORDER — METFORMIN HCL 500 MG PO TABS
500.0000 mg | ORAL_TABLET | Freq: Two times a day (BID) | ORAL | Status: DC
  Administered 2015-11-01 – 2015-11-07 (×13): 500 mg via ORAL
  Filled 2015-10-31 (×13): qty 1

## 2015-10-31 MED ORDER — ATORVASTATIN CALCIUM 40 MG PO TABS
40.0000 mg | ORAL_TABLET | Freq: Every day | ORAL | Status: DC
  Administered 2015-11-01 – 2015-11-07 (×7): 40 mg via ORAL
  Filled 2015-10-31 (×7): qty 1

## 2015-10-31 MED ORDER — OXYBUTYNIN CHLORIDE 5 MG PO TABS
10.0000 mg | ORAL_TABLET | Freq: Two times a day (BID) | ORAL | Status: DC
Start: 2015-11-01 — End: 2015-11-07
  Administered 2015-11-01 – 2015-11-07 (×14): 10 mg via ORAL
  Filled 2015-10-31 (×14): qty 2

## 2015-10-31 MED ORDER — SODIUM CHLORIDE 0.9 % IV SOLN
INTRAVENOUS | Status: DC
  Administered 2015-11-01 – 2015-11-02 (×5): via INTRAVENOUS

## 2015-10-31 MED ORDER — DEXTROSE 5 % IV SOLN
1.0000 g | INTRAVENOUS | Status: DC
  Administered 2015-11-01 – 2015-11-04 (×4): 1 g via INTRAVENOUS
  Filled 2015-10-31 (×5): qty 10

## 2015-10-31 MED ORDER — LEVOTHYROXINE SODIUM 75 MCG PO TABS
75.0000 ug | ORAL_TABLET | Freq: Every day | ORAL | Status: DC
  Administered 2015-11-01 – 2015-11-07 (×7): 75 ug via ORAL
  Filled 2015-10-31 (×7): qty 1

## 2015-10-31 MED ORDER — OXYCODONE HCL 5 MG PO TABS
5.0000 mg | ORAL_TABLET | Freq: Three times a day (TID) | ORAL | Status: DC | PRN
  Administered 2015-11-01 – 2015-11-04 (×3): 5 mg via ORAL
  Filled 2015-10-31 (×3): qty 1

## 2015-10-31 MED ORDER — AMLODIPINE BESYLATE 5 MG PO TABS: 10.0000 mg | ORAL_TABLET | Freq: Every day | ORAL | Status: DC

## 2015-10-31 MED ORDER — FLUOXETINE HCL 20 MG PO CAPS
60.0000 mg | ORAL_CAPSULE | Freq: Every day | ORAL | Status: DC
  Administered 2015-11-01 – 2015-11-07 (×7): 60 mg via ORAL
  Filled 2015-10-31 (×7): qty 3

## 2015-10-31 MED ORDER — ENOXAPARIN SODIUM 40 MG/0.4ML ~~LOC~~ SOLN
40.0000 mg | SUBCUTANEOUS | Status: DC
  Administered 2015-11-01 – 2015-11-06 (×6): 40 mg via SUBCUTANEOUS
  Filled 2015-10-31 (×6): qty 0.4

## 2015-10-31 MED ORDER — OXYCODONE HCL ER 10 MG PO T12A
10.0000 mg | EXTENDED_RELEASE_TABLET | Freq: Two times a day (BID) | ORAL | Status: DC
  Administered 2015-11-01 – 2015-11-07 (×13): 10 mg via ORAL
  Filled 2015-10-31 (×13): qty 1

## 2015-10-31 MED ORDER — DEXTROSE 5 % IV SOLN: 1.0000 g | Freq: Once | INTRAVENOUS | Status: DC

## 2015-10-31 MED ORDER — DEXTROSE 5 % IV SOLN
1.0000 g | Freq: Once | INTRAVENOUS | Status: AC
  Administered 2015-10-31: 1 g via INTRAVENOUS
  Filled 2015-10-31: qty 10

## 2015-10-31 MED ORDER — ASPIRIN EC 81 MG PO TBEC
81.0000 mg | DELAYED_RELEASE_TABLET | Freq: Every day | ORAL | Status: DC
  Administered 2015-11-01 – 2015-11-07 (×7): 81 mg via ORAL
  Filled 2015-10-31 (×7): qty 1

## 2015-10-31 MED ORDER — SODIUM CHLORIDE 0.9 % IV BOLUS (SEPSIS)
500.0000 mL | Freq: Once | INTRAVENOUS | Status: AC
  Administered 2015-10-31: 500 mL via INTRAVENOUS

## 2015-10-31 MED ORDER — LOSARTAN POTASSIUM 50 MG PO TABS: 50.0000 mg | ORAL_TABLET | Freq: Two times a day (BID) | ORAL | Status: DC

## 2015-10-31 MED ORDER — ALBUTEROL SULFATE (2.5 MG/3ML) 0.083% IN NEBU: 2.5000 mg | INHALATION_SOLUTION | Freq: Three times a day (TID) | RESPIRATORY_TRACT | Status: DC | PRN

## 2015-10-31 MED ORDER — ACETAMINOPHEN 325 MG PO TABS
650.0000 mg | ORAL_TABLET | Freq: Four times a day (QID) | ORAL | Status: DC | PRN
  Administered 2015-11-01 (×2): 650 mg via ORAL
  Filled 2015-10-31 (×3): qty 2

## 2015-10-31 MED ORDER — ALBUTEROL SULFATE HFA 108 (90 BASE) MCG/ACT IN AERS: 2.0000 | INHALATION_SPRAY | Freq: Every day | RESPIRATORY_TRACT | Status: DC | PRN

## 2015-10-31 MED ORDER — SENNA 8.6 MG PO TABS
2.0000 | ORAL_TABLET | Freq: Two times a day (BID) | ORAL | Status: DC | PRN
  Administered 2015-11-02: 17.2 mg via ORAL
  Filled 2015-10-31: qty 2

## 2015-10-31 MED ORDER — POLYETHYLENE GLYCOL 3350 17 G PO PACK
17.0000 g | PACK | Freq: Every day | ORAL | Status: DC
  Administered 2015-11-01 – 2015-11-07 (×7): 17 g via ORAL
  Filled 2015-10-31 (×7): qty 1

## 2015-10-31 MED ORDER — PANTOPRAZOLE SODIUM 40 MG PO TBEC
40.0000 mg | DELAYED_RELEASE_TABLET | Freq: Every day | ORAL | Status: DC
  Administered 2015-11-01 – 2015-11-07 (×7): 40 mg via ORAL
  Filled 2015-10-31 (×7): qty 1

## 2015-10-31 MED ORDER — CYCLOBENZAPRINE HCL 5 MG PO TABS
5.0000 mg | ORAL_TABLET | Freq: Two times a day (BID) | ORAL | Status: DC | PRN
  Administered 2015-11-01 – 2015-11-06 (×2): 5 mg via ORAL
  Filled 2015-10-31 (×3): qty 1

## 2015-10-31 MED ORDER — GABAPENTIN 300 MG PO CAPS
600.0000 mg | ORAL_CAPSULE | Freq: Two times a day (BID) | ORAL | Status: DC
  Administered 2015-11-01 – 2015-11-07 (×14): 600 mg via ORAL
  Filled 2015-10-31 (×14): qty 2

## 2015-10-31 NOTE — ED Triage Notes (Signed)
EMS reports patient has not been feeling well for a couple of days but last night started feeling weak and with fever.  Patient has freq  UTI's   His of stroke on the left side but states she is feeling weak all over.  EMS adm Tylenol 1000mg   Patient states she is confused - cannot remember the day.

## 2015-10-31 NOTE — ED Notes (Signed)
Tammy PollenAudrey Williams, MD (resident) stated we were not doing the fluids for the sepsis protocol.  Only wanted 500ml bolus

## 2015-10-31 NOTE — ED Provider Notes (Signed)
[facility] DEPT Provider Note   CSN: [id number] Arrival date & time: 10/31/15  2059     History   Chief Complaint Chief Complaint  Patient presents with  . Fever  . Weakness    HPI Tammy Shepard is a 65 y.o. female.  Tammy Shepard is a 65 y.o. 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 Pt c/o dysuria and suprapubic abdominal pain, malodorous urine, concern for recurrent UTI. Pt also c/o headache, denies new weakness/numbness. Pt reports cough and rhinorrhea, URI sx, states family is ill with similar.       Past Medical History:  Diagnosis Date  . Asthma   . CHF (congestive heart failure) ([place])   . CVA (cerebrovascular accident due to intracerebral hemorrhage) ([place])   . Diabetes mellitus without complication ([place])   . Hyperlipemia   . Hypertension   . Stroke [facility])     Patient Active Problem List   Diagnosis Date Noted  . HTN (hypertension) 10/31/2015  . 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 Fever 10/31/2015  . UTI (lower urinary tract infection) 10/31/2015    Past Surgical History:  Procedure Laterality Date  . CERVICAL FUSION      OB History    No data available       Home Medications    Prior to Admission medications   Medication Sig Start Date End Date Taking? Authorizing Provider  albuterol (PROVENTIL HFA;VENTOLIN HFA) 108 (90 Base) MCG/ACT inhaler Inhale 2 puffs into the lungs daily as needed for shortness of breath. 11/04/12  Yes Historical Provider, MD  albuterol (PROVENTIL) (2.5 MG/3ML) 0.083% nebulizer solution Take 2.5 mg by nebulization every 8 (eight) hours as needed for shortness of breath. 11/09/13  Yes Historical Provider, MD  amLODipine (NORVASC) 10 MG tablet Take 10 mg by mouth daily. 07/06/13  Yes Historical Provider, MD  aspirin EC 81 MG tablet  Take 81 mg by mouth daily.   Yes Historical Provider, MD  atorvastatin (LIPITOR) 40 MG tablet Take 40 mg by mouth every morning.   Yes Historical Provider, MD  clonazePAM (KLONOPIN) 0.5 MG tablet Take 0.5 mg by mouth 2 (two) times daily as needed for anxiety.   Yes Historical Provider, MD  cloNIDine (CATAPRES) 0.3 MG tablet Take 0.3 mg by mouth 2 (two) times daily.   Yes Historical Provider, MD  Cyanocobalamin (RA VITAMIN B-12 TR) 1000 MCG TBCR Take 1,000 mcg by mouth daily.   Yes Historical Provider, MD  cyclobenzaprine (FLEXERIL) 5 MG tablet Take 5 mg by mouth 2 (two) times daily as needed for muscle spasms. 10/02/15  Yes Historical Provider, MD  FLUoxetine (PROZAC) 20 MG capsule Take 60 mg by mouth daily. 01/10/15 01/10/16 Yes Historical Provider, MD  gabapentin (NEURONTIN) 300 MG capsule Take 600 mg by mouth 2 (two) times daily. 10/27/15  Yes Historical Provider, MD  levothyroxine (SYNTHROID, LEVOTHROID) 75 MCG tablet Take 75 mcg by mouth daily before breakfast. 09/03/13  Yes Historical Provider, MD  lidocaine (XYLOCAINE) 5 % ointment Apply 1 application topically 3 (three) times daily as needed for pain. 10/02/15 10/01/16 Yes Historical Provider, MD  losartan (COZAAR) 50 MG tablet Take 50 mg by mouth 2 (two) times daily. 10/27/15  Yes Historical Provider, MD  meloxicam (MOBIC) 15 MG tablet Take 15 mg by mouth daily. 08/28/15  Yes Historical Provider, MD  metFORMIN (GLUCOPHAGE) 500 MG tablet Take 500 mg by mouth 2 (two) times daily. 10/16/15  Yes Historical Provider, MD  omeprazole (PRILOSEC) 20 MG capsule Take 20 mg by mouth daily. 09/18/15  Yes Historical Provider, MD  oxybutynin (DITROPAN) 5 MG tablet Take 10 mg by mouth 2 (two) times daily. 01/10/14  Yes Historical Provider, MD  oxyCODONE (OXY IR/ROXICODONE) 5 MG immediate release tablet Take 5 mg by mouth daily as needed. 10/02/15  Yes Historical Provider, MD  oxyCODONE (OXYCONTIN) 10 mg 12 hr tablet Take 10 mg by mouth every 12 (twelve) hours. 10/02/15   Yes Historical Provider, MD  polyethylene glycol (MIRALAX / GLYCOLAX) packet Take 17 g by mouth daily.   Yes Historical Provider, MD  senna (SENOKOT) 8.6 MG tablet Take 2 tablets by mouth 2 (two) times daily as needed for constipation. 06/05/13  Yes Historical Provider, MD  traZODone (DESYREL) 100 MG tablet Take 200 mg by mouth at bedtime. 08/27/14  Yes Historical Provider, MD  trimethoprim (TRIMPEX) 100 MG tablet Take 100 mg by mouth daily. 08/01/15  Yes Historical Provider, MD    Family History No family history on file.  Social History Social History  Substance Use Topics  . Smoking status: Former Research scientist (life sciences)  . Smokeless tobacco: Never Used  . Alcohol use No     Allergies   Amoxicillin; Ciprofloxacin; Clindamycin hcl; Penicillins; Codeine; Baclofen; Contrast media [iodinated diagnostic agents]; Erythromycin; Iodides; Latex; Nickel; Soap; Sulfa antibiotics; Tape; Tolterodine; and Vancomycin   Review of Systems Review of Systems  Constitutional: Positive for chills, fatigue and fever.  HENT: Positive for congestion, postnasal drip and rhinorrhea. Negative for trouble swallowing and voice change.   Eyes: Negative for visual disturbance.  Respiratory: Negative for shortness of breath.   Cardiovascular: Negative for chest pain and leg swelling.  Gastrointestinal: Positive for abdominal pain. Negative for diarrhea, nausea and vomiting.       Suprapubic abdominal discomfort  Genitourinary: Positive for dysuria. Negative for flank pain.  Musculoskeletal: Negative for arthralgias, back pain, myalgias and neck pain.  Skin: Negative for rash.  Neurological: Positive for headaches. Negative for speech difficulty.     Physical Exam Updated Vital Signs BP (!) 108/50   Pulse 79   Temp 102.7 F (39.3 C) (Rectal)   Resp 10   Ht 5' 4" (1.626 m)   Wt 78 kg   SpO2 96%   BMI 29.52 kg/m   Physical Exam  Constitutional: She is oriented to person, place, and time. She appears well-developed  and well-nourished. No distress.  Pleasant, cooperative, chronically ill-appearing  HENT:  Head: Normocephalic and atraumatic.  Right Ear: External ear normal.  Left Ear: External ear normal.  Nose: Nose normal.  Mildly dry mucous membranes. Clear oropharynx  Eyes: Conjunctivae and EOM are normal. Pupils are equal, round, and reactive to light. No scleral icterus.  Neck: Normal range of motion. Neck supple. No JVD present.  No C-spine TTP  Cardiovascular: Normal rate, regular rhythm and intact distal pulses.   No murmur heard. Pulmonary/Chest: Effort normal and breath sounds normal. No respiratory distress.  Abdominal: Soft. She exhibits no distension. There is no tenderness.  Musculoskeletal: She exhibits no edema, tenderness or deformity.  Contractures of LUE and LLE c/w h/o remote CVA with residual left-sided weakness  Neurological: She is alert and oriented to person, place, and time.  1/5 strength LUE and LLE. 5/5 strength RUE and RLE. Normal speech, normal mentation.  Skin: Skin is warm and dry. Capillary refill takes less  than 2 seconds. No rash noted. She is not diaphoretic.  Psychiatric: She has a normal mood and affect.  Nursing note and vitals reviewed.    ED Treatments / Results  Labs (all labs ordered are listed, but only abnormal results are displayed) Labs Reviewed  COMPREHENSIVE METABOLIC PANEL - Abnormal; Notable for the following:       Result Value   Glucose, Bld 159 (*)    BUN 21 (*)    Creatinine, Ser 1.19 (*)    Calcium 8.7 (*)    Total Protein 6.1 (*)    Albumin 3.0 (*)    AST 14 (*)    GFR calc non Af Amer 48 (*)    GFR calc Af Amer 55 (*)    All other components within normal limits  CBC WITH DIFFERENTIAL/PLATELET - Abnormal; Notable for the following:    WBC 11.5 (*)    Neutro Abs 8.9 (*)    Monocytes Absolute 1.4 (*)    All other components within normal limits  URINALYSIS, ROUTINE W REFLEX MICROSCOPIC (NOT AT [facility]) - Abnormal; Notable for the  following:    APPearance TURBID (*)    Hgb urine dipstick MODERATE (*)    Ketones, ur 15 (*)    Protein, ur 30 (*)    Nitrite POSITIVE (*)    Leukocytes, UA LARGE (*)    All other components within normal limits  URINE MICROSCOPIC-ADD ON - Abnormal; Notable for the following:    Squamous Epithelial / LPF 0-5 (*)    Bacteria, UA MANY (*)    All other components within normal limits  CBG MONITORING, ED - Abnormal; Notable for the following:    Glucose-Capillary 158 (*)    All other components within normal limits  URINE CULTURE  CULTURE, BLOOD (ROUTINE X 2)  CULTURE, BLOOD (ROUTINE X 2)  BASIC METABOLIC PANEL  CBC  I-STAT BETA HCG BLOOD, ED ([facility], [facility], [facility] ONLY)  I-STAT CG4 LACTIC ACID, ED  I-STAT CG4 LACTIC ACID, ED    EKG  EKG Interpretation  Date/Time:  Friday October 31 2015 21:12:26 EDT Ventricular Rate:  96 PR Interval:    QRS Duration: 80 QT Interval:  344 QTC Calculation: 435 R Axis:   42 Text Interpretation:  Sinus rhythm Minimal ST depression, diffuse leads , new since last tracing Confirmed by KNAPP  MD-J, JON ([id number]) on 10/31/2015 9:21:49 PM       Radiology Ct Head Wo Contrast  Result Date: 10/31/2015 CLINICAL DATA:  Acute onset of right-sided weakness and headache. Blurred vision and fever. Initial encounter. EXAM: CT HEAD WITHOUT CONTRAST TECHNIQUE: Contiguous axial images were obtained from the base of the skull through the vertex without intravenous contrast. COMPARISON:  None. FINDINGS: Brain: No evidence of acute infarction, hemorrhage, hydrocephalus, extra-axial collection or mass lesion/mass effect. A large chronic infarct is noted at the right occipital lobe. Additional chronic infarcts are seen at the high parietal lobes bilaterally, with associated encephalomalacia. Prominence of the ventricles and sulci reflects mild cortical volume loss. The brainstem and fourth ventricle are within normal limits. The basal ganglia are unremarkable in appearance. No mass  effect or midline shift is seen. Vascular: No hyperdense vessel or unexpected calcification. Skull: There is no evidence of fracture; visualized osseous structures are unremarkable in appearance. Sinuses/Orbits: The orbits are within normal limits. The paranasal sinuses and mastoid air cells are well-aerated. Other: No significant soft tissue abnormalities are seen. IMPRESSION: 1. No acute intracranial pathology seen on CT. 2. Large  chronic infarct at the right occipital lobe. Additional chronic infarct at the high parietal lobes bilaterally, with associated encephalomalacia. 3. Mild cortical volume loss noted. Electronically Signed   By: Garald Balding M.D.   On: 10/31/2015 23:44   Dg Chest Port 1 View  Result Date: 10/31/2015 CLINICAL DATA:  Acute onset of fever and cough.  Initial encounter. EXAM: PORTABLE CHEST 1 VIEW COMPARISON:  None. FINDINGS: The lungs are well-aerated. Minimal bibasilar atelectasis is noted. There is no evidence of pleural effusion or pneumothorax. The cardiomediastinal silhouette is borderline enlarged. No acute osseous abnormalities are seen. IMPRESSION: Minimal bibasilar atelectasis noted. Lungs otherwise clear. Borderline cardiomegaly. Electronically Signed   By: Garald Balding M.D.   On: 10/31/2015 22:31    Procedures Procedures (including critical care time)  Medications Ordered in ED Medications  oxyCODONE (OXYCONTIN) 12 hr tablet 10 mg (not administered)  oxyCODONE (Oxy IR/ROXICODONE) immediate release tablet 5 mg (not administered)  albuterol (PROVENTIL) (2.5 MG/3ML) 0.083% nebulizer solution 2.5 mg (not administered)  aspirin EC tablet 81 mg (not administered)  atorvastatin (LIPITOR) tablet 40 mg (not administered)  clonazePAM (KLONOPIN) tablet 0.5 mg (not administered)  gabapentin (NEURONTIN) capsule 600 mg (not administered)  FLUoxetine (PROZAC) capsule 60 mg (not administered)  cyclobenzaprine (FLEXERIL) tablet 5 mg (not administered)  levothyroxine  (SYNTHROID, LEVOTHROID) tablet 75 mcg (not administered)  meloxicam (MOBIC) tablet 15 mg (not administered)  pantoprazole (PROTONIX) EC tablet 40 mg (not administered)  oxybutynin (DITROPAN) tablet 10 mg (not administered)  traZODone (DESYREL) tablet 200 mg (not administered)  polyethylene glycol (MIRALAX / GLYCOLAX) packet 17 g (not administered)  senna (SENOKOT) tablet 17.2 mg (not administered)  metFORMIN (GLUCOPHAGE) tablet 500 mg (not administered)  acetaminophen (TYLENOL) tablet 650 mg (not administered)  0.9 %  sodium chloride infusion (not administered)  enoxaparin (LOVENOX) injection 40 mg (not administered)  cefTRIAXone (ROCEPHIN) 1 g in dextrose 5 % 50 mL IVPB (not administered)  cefTRIAXone (ROCEPHIN) 1 g in dextrose 5 % 50 mL IVPB (0 g Intravenous Stopped 10/31/15 2312)  sodium chloride 0.9 % bolus 500 mL (0 mLs Intravenous Stopped 10/31/15 2314)     Initial Impression / Assessment and Plan / ED Course  I have reviewed the triage vital signs and the nursing notes.  Pertinent labs & imaging results that were available during my care of the patient were reviewed by me and considered in my medical decision making (see chart for details).  Clinical Course  Comment By Time  Pt presented to the ED with fever and history of utis.  Lactic acid level not elevated.  Discussed allergies with patient.  She has tolerated rocephin in the past.  Will start with IV rocephin empiric abx.  Plan on admission Dorie Rank, MD 09/22 2216    DAINELLE HUN is a 65 y.o. female with h/o recurrent UTIs and chronic left-sided weakness 2/2 remote hemorrhagic CVA who presents to ED via EMS from home, where daughter found her to be covered in malodorous urine, mild confusion, though alert and oriented here. Pt c/o dysuria and suprapubic pain, concern for recurrent UTI. Noted at time of arrival to have rectal temp 102.7, given empiric tylenol and 500cc fluids. No additional fluids given 2/2 normal LA and h/o  HF. No tachycardia, normal Bp. Gathered blood cx, as met SIRS criteria with HR in 90's. UA with UTI, tx with Rocephin in Ed. CT head ordered 2/2 pt complaint of headache and daughter concern for stroke though pt has no new deficits. Doubt  acute stroke as etiology of sx. Mild confusion likely 2/2 UTI with fever. Will admit for further management, as may need a couple days of IV abx to prevent sepsis.  Pt condition, course, and admission were discussed with attending physician Dr. Dorie Rank.  Final Clinical Impressions(s) / ED Diagnoses   Final diagnoses:  UTI (lower urinary tract infection)  Generalized weakness  Fever in adult    New Prescriptions New Prescriptions   No medications on file     Paralee Cancel, MD 11/01/15 0006    Dorie Rank, MD 11/01/15 [phone]

## 2015-11-01 DIAGNOSIS — R509 Fever, unspecified: Secondary | ICD-10-CM

## 2015-11-01 DIAGNOSIS — I10 Essential (primary) hypertension: Secondary | ICD-10-CM

## 2015-11-01 DIAGNOSIS — N390 Urinary tract infection, site not specified: Principal | ICD-10-CM

## 2015-11-01 LAB — BASIC METABOLIC PANEL
ANION GAP: 8 (ref 5–15)
BUN: 20 mg/dL (ref 6–20)
CO2: 21 mmol/L — ABNORMAL LOW (ref 22–32)
Calcium: 8.2 mg/dL — ABNORMAL LOW (ref 8.9–10.3)
Chloride: 109 mmol/L (ref 101–111)
Creatinine, Ser: 1.07 mg/dL — ABNORMAL HIGH (ref 0.44–1.00)
GFR calc Af Amer: >60 mL/min (ref >60)
GFR, EST NON AFRICAN AMERICAN: 54 mL/min — AB (ref >60)
Glucose, Bld: 115 mg/dL — ABNORMAL HIGH (ref 65–99)
POTASSIUM: 3.7 mmol/L (ref 3.5–5.1)
SODIUM: 138 mmol/L (ref 135–145)

## 2015-11-01 LAB — CBC
HEMATOCRIT: 35.9 % — AB (ref 36.0–46.0)
HEMOGLOBIN: 11.3 g/dL — AB (ref 12.0–15.0)
MCH: 30.9 pg (ref 26.0–34.0)
MCHC: 31.5 g/dL (ref 30.0–36.0)
MCV: 98.1 fL (ref 78.0–100.0)
Platelets: 137 10*3/uL — ABNORMAL LOW (ref 150–400)
RBC: 3.66 MIL/uL — ABNORMAL LOW (ref 3.87–5.11)
RDW: 12.8 % (ref 11.5–15.5)
WBC: 9.5 10*3/uL (ref 4.0–10.5)

## 2015-11-01 NOTE — Progress Notes (Signed)
Received patient from ED accompanied by [facility] [employer] [employer] [facility]  Patient AOx4, VS stable, left sided weakness d/t hx of stroke 5 years ago, pain at 10/10 due to headache.  CT of head showed no acute intracranial abnormality and showed large chronic infarct at right occipital lobe.  Administered PRN medications as ordered.  Patient hooked to telemetry box#7, CCMD made aware and had sandwich.  Patient oriented to room, bed controls and call light.  Patient resting on bed comfortably watching TV.

## 2015-11-01 NOTE — H&P (Signed)
History and Physical    Tammy Shepard:[id number] DOB: [birth year] DOA: 10/31/2015   PCP: Laurence Aly, MD, MD Chief Complaint:  Chief Complaint  Patient presents with  . Fever  . Weakness    HPI: Tammy Shepard is a 65 y.o. female with medical history significant of recurrent UTIs, HTN, CVA.  Patient presents to the ED with c/o fever, new generalized weakness that is worse than her baseline LUE / LLE weakness that she has at baseline and renders her wheelchair bound at baseline.  Has associated foul smelling urine, dysuria, suprapubic pain, headache.  Concerned she may have a UTI.  Symptoms ongoing for last couple of days but fever onset last night.  Also has URI symptoms, family member is ill with similar.  ED Course: Has UTI, Tm 102.7.  BPs running 100 systolic.  Review of Systems: As per HPI otherwise 10 point review of systems negative.    Past Medical History:  Diagnosis Date  . Asthma   . CHF (congestive heart failure) (HCC)   . CVA (cerebrovascular accident due to intracerebral hemorrhage) (HCC)   . Diabetes mellitus without complication (HCC)   . Hyperlipemia   . Hypertension   . Stroke [facility])     Past Surgical History:  Procedure Laterality Date  . CERVICAL FUSION       reports that she has quit smoking. She has never used smokeless tobacco. She reports that she does not drink alcohol or use drugs.  Allergies  Allergen Reactions  . Amoxicillin Rash  . Ciprofloxacin Anaphylaxis  . Clindamycin Hcl Swelling, Rash and Other (See Comments)    Other Reaction: BAD RASH AND SWELLING, SKIN BR Severe rash and swelling. Skin break down.  Marland Kitchen Penicillins Anaphylaxis  . Codeine Nausea And Vomiting  . Baclofen Itching and Rash  . Contrast Media [Iodinated Diagnostic Agents] Itching and Rash  . Erythromycin Rash  . Iodides Rash    Povidone iodine; betadine  . Latex Itching and Rash  . Nickel Rash and Other (See Comments)    OTHER REACTION  . Soap Itching and  Rash  . Sulfa Antibiotics Rash  . Tape Rash    (silk tape only) skin break down  . Tolterodine Rash  . Vancomycin Rash    No family history on file. Does have family members sick with URI currently.   Prior to Admission medications   Medication Sig Start Date End Date Taking? Authorizing Provider  albuterol (PROVENTIL HFA;VENTOLIN HFA) 108 (90 Base) MCG/ACT inhaler Inhale 2 puffs into the lungs daily as needed for shortness of breath. 11/04/12  Yes Historical Provider, MD  albuterol (PROVENTIL) (2.5 MG/3ML) 0.083% nebulizer solution Take 2.5 mg by nebulization every 8 (eight) hours as needed for shortness of breath. 11/09/13  Yes Historical Provider, MD  amLODipine (NORVASC) 10 MG tablet Take 10 mg by mouth daily. 07/06/13  Yes Historical Provider, MD  aspirin EC 81 MG tablet Take 81 mg by mouth daily.   Yes Historical Provider, MD  atorvastatin (LIPITOR) 40 MG tablet Take 40 mg by mouth every morning.   Yes Historical Provider, MD  clonazePAM (KLONOPIN) 0.5 MG tablet Take 0.5 mg by mouth 2 (two) times daily as needed for anxiety.   Yes Historical Provider, MD  cloNIDine (CATAPRES) 0.3 MG tablet Take 0.3 mg by mouth 2 (two) times daily.   Yes Historical Provider, MD  Cyanocobalamin (RA VITAMIN B-12 TR) 1000 MCG TBCR Take 1,000 mcg by mouth daily.   Yes Historical Provider,  MD  cyclobenzaprine (FLEXERIL) 5 MG tablet Take 5 mg by mouth 2 (two) times daily as needed for muscle spasms. 10/02/15  Yes Historical Provider, MD  FLUoxetine (PROZAC) 20 MG capsule Take 60 mg by mouth daily. 01/10/15 01/10/16 Yes Historical Provider, MD  gabapentin (NEURONTIN) 300 MG capsule Take 600 mg by mouth 2 (two) times daily. 10/27/15  Yes Historical Provider, MD  levothyroxine (SYNTHROID, LEVOTHROID) 75 MCG tablet Take 75 mcg by mouth daily before breakfast. 09/03/13  Yes Historical Provider, MD  lidocaine (XYLOCAINE) 5 % ointment Apply 1 application topically 3 (three) times daily as needed for pain. 10/02/15 10/01/16  Yes Historical Provider, MD  losartan (COZAAR) 50 MG tablet Take 50 mg by mouth 2 (two) times daily. 10/27/15  Yes Historical Provider, MD  meloxicam (MOBIC) 15 MG tablet Take 15 mg by mouth daily. 08/28/15  Yes Historical Provider, MD  metFORMIN (GLUCOPHAGE) 500 MG tablet Take 500 mg by mouth 2 (two) times daily. 10/16/15  Yes Historical Provider, MD  omeprazole (PRILOSEC) 20 MG capsule Take 20 mg by mouth daily. 09/18/15  Yes Historical Provider, MD  oxybutynin (DITROPAN) 5 MG tablet Take 10 mg by mouth 2 (two) times daily. 01/10/14  Yes Historical Provider, MD  oxyCODONE (OXY IR/ROXICODONE) 5 MG immediate release tablet Take 5 mg by mouth daily as needed. 10/02/15  Yes Historical Provider, MD  oxyCODONE (OXYCONTIN) 10 mg 12 hr tablet Take 10 mg by mouth every 12 (twelve) hours. 10/02/15  Yes Historical Provider, MD  polyethylene glycol (MIRALAX / GLYCOLAX) packet Take 17 g by mouth daily.   Yes Historical Provider, MD  senna (SENOKOT) 8.6 MG tablet Take 2 tablets by mouth 2 (two) times daily as needed for constipation. 06/05/13  Yes Historical Provider, MD  traZODone (DESYREL) 100 MG tablet Take 200 mg by mouth at bedtime. 08/27/14  Yes Historical Provider, MD  trimethoprim (TRIMPEX) 100 MG tablet Take 100 mg by mouth daily. 08/01/15  Yes Historical Provider, MD    Physical Exam: Vitals:   10/31/15 2334 10/31/15 2335 10/31/15 2345 11/01/15 0000  BP: 100/61  100/72 (!) 108/50  Pulse:  81 81 79  Resp:  11 (!) 8 10  Temp:      TempSrc:      SpO2:  95% 96% 96%  Weight:      Height:          Constitutional: NAD, calm, comfortable Eyes: PERRL, lids and conjunctivae normal ENMT: Mucous membranes are moist. Posterior pharynx clear of any exudate or lesions.Normal dentition.  Neck: normal, supple, no masses, no thyromegaly Respiratory: clear to auscultation bilaterally, no wheezing, no crackles. Normal respiratory effort. No accessory muscle use.  Cardiovascular: Regular rate and rhythm, no murmurs  / rubs / gallops. No extremity edema. 2+ pedal pulses. No carotid bruits.  Abdomen: no tenderness, no masses palpated. No hepatosplenomegaly. Bowel sounds positive.  Musculoskeletal: no clubbing / cyanosis. No joint deformity upper and lower extremities. Good ROM, no contractures. Normal muscle tone.  Skin: no rashes, lesions, ulcers. No induration Neurologic: 1/5 strength LLE, LUE with contractures (chronic), 5/5 on R side, speech is clear. Psychiatric: Normal judgment and insight. Alert and oriented x 3. Normal mood.    Labs on Admission: I have personally reviewed following labs and imaging studies  CBC:  Recent Labs Lab 10/31/15 2110  WBC 11.5*  NEUTROABS 8.9*  HGB 12.2  HCT 37.6  MCV 96.7  PLT 165   Basic Metabolic Panel:  Recent Labs Lab 10/31/15 2110  NA  136  K 4.2  CL 106  CO2 25  GLUCOSE 159*  BUN 21*  CREATININE 1.19*  CALCIUM 8.7*   GFR: Estimated Creatinine Clearance: 48.9 mL/min (by C-G formula based on SCr of 1.19 mg/dL (H)). Liver Function Tests:  Recent Labs Lab 10/31/15 2110  AST 14*  ALT 17  ALKPHOS 70  BILITOT 0.7  PROT 6.1*  ALBUMIN 3.0*   No results for input(s): LIPASE, AMYLASE in the last 168 hours. No results for input(s): AMMONIA in the last 168 hours. Coagulation Profile: No results for input(s): INR, PROTIME in the last 168 hours. Cardiac Enzymes: No results for input(s): CKTOTAL, CKMB, CKMBINDEX, TROPONINI in the last 168 hours. BNP (last 3 results) No results for input(s): PROBNP in the last 8760 hours. HbA1C: No results for input(s): HGBA1C in the last 72 hours. CBG:  Recent Labs Lab 10/31/15 2105  GLUCAP 158*   Lipid Profile: No results for input(s): CHOL, HDL, LDLCALC, TRIG, CHOLHDL, LDLDIRECT in the last 72 hours. Thyroid Function Tests: No results for input(s): TSH, T4TOTAL, FREET4, T3FREE, THYROIDAB in the last 72 hours. Anemia Panel: No results for input(s): VITAMINB12, FOLATE, FERRITIN, TIBC, IRON, RETICCTPCT  in the last 72 hours. Urine analysis:    Component Value Date/Time   COLORURINE YELLOW 10/31/2015 2114   APPEARANCEUR TURBID (A) 10/31/2015 2114   APPEARANCEUR Cloudy 08/18/2011 1018   LABSPEC 1.021 10/31/2015 2114   LABSPEC 1.017 08/18/2011 1018   PHURINE 7.0 10/31/2015 2114   GLUCOSEU NEGATIVE 10/31/2015 2114   GLUCOSEU Negative 08/18/2011 1018   HGBUR MODERATE (A) 10/31/2015 2114   BILIRUBINUR NEGATIVE 10/31/2015 2114   BILIRUBINUR Negative 08/18/2011 1018   KETONESUR 15 (A) 10/31/2015 2114   PROTEINUR 30 (A) 10/31/2015 2114   NITRITE POSITIVE (A) 10/31/2015 2114   LEUKOCYTESUR LARGE (A) 10/31/2015 2114   LEUKOCYTESUR 3+ 08/18/2011 1018   Sepsis Labs: @LABRCNTIP (procalcitonin:4,lacticidven:4) )No results found for this or any previous visit (from the past 240 hour(s)).   Radiological Exams on Admission: Ct Head Wo Contrast  Result Date: 10/31/2015 CLINICAL DATA:  Acute onset of right-sided weakness and headache. Blurred vision and fever. Initial encounter. EXAM: CT HEAD WITHOUT CONTRAST TECHNIQUE: Contiguous axial images were obtained from the base of the skull through the vertex without intravenous contrast. COMPARISON:  None. FINDINGS: Brain: No evidence of acute infarction, hemorrhage, hydrocephalus, extra-axial collection or mass lesion/mass effect. A large chronic infarct is noted at the right occipital lobe. Additional chronic infarcts are seen at the high parietal lobes bilaterally, with associated encephalomalacia. Prominence of the ventricles and sulci reflects mild cortical volume loss. The brainstem and fourth ventricle are within normal limits. The basal ganglia are unremarkable in appearance. No mass effect or midline shift is seen. Vascular: No hyperdense vessel or unexpected calcification. Skull: There is no evidence of fracture; visualized osseous structures are unremarkable in appearance. Sinuses/Orbits: The orbits are within normal limits. The paranasal sinuses and  mastoid air cells are well-aerated. Other: No significant soft tissue abnormalities are seen. IMPRESSION: 1. No acute intracranial pathology seen on CT. 2. Large chronic infarct at the right occipital lobe. Additional chronic infarct at the high parietal lobes bilaterally, with associated encephalomalacia. 3. Mild cortical volume loss noted. Electronically Signed   By: Roanna Raider M.D.   On: 10/31/2015 23:44   Dg Chest Port 1 View  Result Date: 10/31/2015 CLINICAL DATA:  Acute onset of fever and cough.  Initial encounter. EXAM: PORTABLE CHEST 1 VIEW COMPARISON:  None. FINDINGS: The lungs are well-aerated. Minimal  bibasilar atelectasis is noted. There is no evidence of pleural effusion or pneumothorax. The cardiomediastinal silhouette is borderline enlarged. No acute osseous abnormalities are seen. IMPRESSION: Minimal bibasilar atelectasis noted. Lungs otherwise clear. Borderline cardiomegaly. Electronically Signed   By: Roanna Raider M.D.   On: 10/31/2015 22:31    EKG: Independently reviewed.  Assessment/Plan Principal Problem:   Recurrent UTI Active Problems:   HTN (hypertension)   Fever   UTI (lower urinary tract infection)    1. UTI with h/o recurrent UTIs, fever - 1. Rocephin - allergy to PCN but tolerates cephalosporins 2. Cultures pending 3. Tylenol for fever 4. IVF 2. HTN - holding BP meds for now   DVT prophylaxis: Lovenox Code Status: Full Family Communication: No family in room Consults called: None Admission status: Admit to obs   Jannatul Wojdyla, Heywood Iles DO [facility] Pager [phone] from 7PM-7AM  If 7AM-7PM, please contact the day physician for the patient [email or web address] Password [facility]  11/01/2015, 12:06 AM

## 2015-11-01 NOTE — Progress Notes (Signed)
PROGRESS NOTE                                                                                                                                                                                                             Patient Demographics:    Tammy Shepard, is a 65 y.o. female, DOB - May 29, 1950, WUJ:[id number]  Admit date - 10/31/2015   Admitting Physician Hillary BowJared M Gardner, DO  Outpatient Primary MD for the patient is Laurence AlyLaura C Gay, MD, MD  LOS - 0  Outpatient Specialists:none  Chief Complaint  Patient presents with  . Fever  . Weakness       Brief Narrative  [age] F with recurrent UTIs, HTN, CVA with fever, dysuria, weakness, suprapubic pain and dizziness. Found to have UTI and placed on observation.   Subjective:   Has some dysuria and dizziness   Assessment  & Plan :    Principal Problem:   Recurrent UTI empiric rocephin. Follow cx. Supportive care with IVF and tylenol  Active Problems:   HTN (hypertension) Holding meds for soft BP  CVA with residual left sided weakness ASA and statin   hypothyroidism Synthroid   Diabetes mellitus Continue metformin and SSI    Code Status : full code  Family Communication  : none at bedside  Disposition Plan  : home tomorrow if improved  Barriers For Discharge : active symptoms  Consults  :  none  Procedures  : none  DVT Prophylaxis  :  Lovenox -   Lab Results  Component Value Date   PLT 137 (L) 11/01/2015    Antibiotics  :    Anti-infectives    Start     Dose/Rate Route Frequency Ordered Stop   11/01/15 2200  cefTRIAXone (ROCEPHIN) 1 g in dextrose 5 % 50 mL IVPB     1 g 100 mL/hr over 30 Minutes Intravenous Every 24 hours 10/31/15 2356     10/31/15 2145  cefTRIAXone (ROCEPHIN) 1 g in dextrose 5 % 50 mL IVPB  Status:  Discontinued     1 g 100 mL/hr over 30 Minutes Intravenous  Once 10/31/15 2133 10/31/15 2134   10/31/15 2145  cefTRIAXone  (ROCEPHIN) 1 g in dextrose 5 % 50 mL IVPB     1 g 100 mL/hr over 30 Minutes Intravenous  Once 10/31/15 2141 10/31/15 2312  Objective:   Vitals:   11/01/15 0107 11/01/15 0453 11/01/15 0643 11/01/15 1317  BP: 123/62 124/63  122/64  Pulse: 79 82  94  Resp:    16  Temp: 98.4 F (36.9 C) 98.6 F (37 C)  98.7 F (37.1 C)  TempSrc: Oral Oral  Oral  SpO2: 95% 93% 96% 98%  Weight: 78 kg (172 lb)     Height: 5\' 4"  (1.626 m)       Wt Readings from Last 3 Encounters:  11/01/15 78 kg (172 lb)  08/31/15 79.4 kg (175 lb)     Intake/Output Summary (Last 24 hours) at 11/01/15 1545 Last data filed at 11/01/15 1328  Gross per 24 hour  Intake             1900 ml  Output              250 ml  Net             1650 ml     Physical Exam  Gen: not in distress HEENT:  moist mucosa, supple neck Chest: clear b/l, no added sounds CVS: N S1&S2, no murmurs,  GI: soft, NT, ND, BS+ Musculoskeletal: warm, no edema     Data Review:    CBC  Recent Labs Lab 10/31/15 2110 11/01/15 0518  WBC 11.5* 9.5  HGB 12.2 11.3*  HCT 37.6 35.9*  PLT 165 137*  MCV 96.7 98.1  MCH 31.4 30.9  MCHC 32.4 31.5  RDW 12.7 12.8  LYMPHSABS 1.2  --   MONOABS 1.4*  --   EOSABS 0.0  --   BASOSABS 0.0  --     Chemistries   Recent Labs Lab 10/31/15 2110 11/01/15 0518  NA 136 138  K 4.2 3.7  CL 106 109  CO2 25 21*  GLUCOSE 159* 115*  BUN 21* 20  CREATININE 1.19* 1.07*  CALCIUM 8.7* 8.2*  AST 14*  --   ALT 17  --   ALKPHOS 70  --   BILITOT 0.7  --    ------------------------------------------------------------------------------------------------------------------ No results for input(s): CHOL, HDL, LDLCALC, TRIG, CHOLHDL, LDLDIRECT in the last 72 hours.  Lab Results  Component Value Date   HGBA1C 6.1 08/08/2011   ------------------------------------------------------------------------------------------------------------------ No results for input(s): TSH, T4TOTAL, T3FREE, THYROIDAB  in the last 72 hours.  Invalid input(s): FREET3 ------------------------------------------------------------------------------------------------------------------ No results for input(s): VITAMINB12, FOLATE, FERRITIN, TIBC, IRON, RETICCTPCT in the last 72 hours.  Coagulation profile No results for input(s): INR, PROTIME in the last 168 hours.  No results for input(s): DDIMER in the last 72 hours.  Cardiac Enzymes No results for input(s): CKMB, TROPONINI, MYOGLOBIN in the last 168 hours.  Invalid input(s): CK ------------------------------------------------------------------------------------------------------------------ No results found for: BNP  Inpatient Medications  Scheduled Meds: . aspirin EC  81 mg Oral Daily  . atorvastatin  40 mg Oral Daily  . cefTRIAXone (ROCEPHIN)  IV  1 g Intravenous Q24H  . enoxaparin (LOVENOX) injection  40 mg Subcutaneous Q24H  . FLUoxetine  60 mg Oral Daily  . gabapentin  600 mg Oral BID  . levothyroxine  75 mcg Oral QAC breakfast  . meloxicam  15 mg Oral Daily  . metFORMIN  500 mg Oral BID WC  . oxybutynin  10 mg Oral BID  . oxyCODONE  10 mg Oral Q12H  . pantoprazole  40 mg Oral Daily  . polyethylene glycol  17 g Oral Daily  . traZODone  200 mg Oral QHS   Continuous Infusions: . sodium  chloride 125 mL/hr at 11/01/15 0039   PRN Meds:.acetaminophen, albuterol, clonazePAM, cyclobenzaprine, oxyCODONE, senna  Micro Results No results found for this or any previous visit (from the past 240 hour(s)).  Radiology Reports Ct Head Wo Contrast  Result Date: 10/31/2015 CLINICAL DATA:  Acute onset of right-sided weakness and headache. Blurred vision and fever. Initial encounter. EXAM: CT HEAD WITHOUT CONTRAST TECHNIQUE: Contiguous axial images were obtained from the base of the skull through the vertex without intravenous contrast. COMPARISON:  None. FINDINGS: Brain: No evidence of acute infarction, hemorrhage, hydrocephalus, extra-axial collection  or mass lesion/mass effect. A large chronic infarct is noted at the right occipital lobe. Additional chronic infarcts are seen at the high parietal lobes bilaterally, with associated encephalomalacia. Prominence of the ventricles and sulci reflects mild cortical volume loss. The brainstem and fourth ventricle are within normal limits. The basal ganglia are unremarkable in appearance. No mass effect or midline shift is seen. Vascular: No hyperdense vessel or unexpected calcification. Skull: There is no evidence of fracture; visualized osseous structures are unremarkable in appearance. Sinuses/Orbits: The orbits are within normal limits. The paranasal sinuses and mastoid air cells are well-aerated. Other: No significant soft tissue abnormalities are seen. IMPRESSION: 1. No acute intracranial pathology seen on CT. 2. Large chronic infarct at the right occipital lobe. Additional chronic infarct at the high parietal lobes bilaterally, with associated encephalomalacia. 3. Mild cortical volume loss noted. Electronically Signed   By: Roanna Raider M.D.   On: 10/31/2015 23:44   Dg Chest Port 1 View  Result Date: 10/31/2015 CLINICAL DATA:  Acute onset of fever and cough.  Initial encounter. EXAM: PORTABLE CHEST 1 VIEW COMPARISON:  None. FINDINGS: The lungs are well-aerated. Minimal bibasilar atelectasis is noted. There is no evidence of pleural effusion or pneumothorax. The cardiomediastinal silhouette is borderline enlarged. No acute osseous abnormalities are seen. IMPRESSION: Minimal bibasilar atelectasis noted. Lungs otherwise clear. Borderline cardiomegaly. Electronically Signed   By: Roanna Raider M.D.   On: 10/31/2015 22:31    Time Spent in minutes  15   Eddie North M.D on 11/01/2015 at 3:45 PM  Between 7am to 7pm - Pager - [phone]  After 7pm go to [email or web address] - password [place]  [facility] -  Office  [phone]

## 2015-11-01 NOTE — ED Notes (Signed)
Pt used call bell asking staff to send family into her room, this RN walked into lobby to find them, no answer; informed patient that family was not currently in lobby; pt verbalized understanding

## 2015-11-02 DIAGNOSIS — I959 Hypotension, unspecified: Secondary | ICD-10-CM | POA: Diagnosis present

## 2015-11-02 DIAGNOSIS — E119 Type 2 diabetes mellitus without complications: Secondary | ICD-10-CM | POA: Diagnosis present

## 2015-11-02 DIAGNOSIS — Z993 Dependence on wheelchair: Secondary | ICD-10-CM | POA: Diagnosis not present

## 2015-11-02 DIAGNOSIS — Z880 Allergy status to penicillin: Secondary | ICD-10-CM | POA: Diagnosis not present

## 2015-11-02 DIAGNOSIS — Z7982 Long term (current) use of aspirin: Secondary | ICD-10-CM | POA: Diagnosis not present

## 2015-11-02 DIAGNOSIS — E039 Hypothyroidism, unspecified: Secondary | ICD-10-CM | POA: Diagnosis present

## 2015-11-02 DIAGNOSIS — I10 Essential (primary) hypertension: Secondary | ICD-10-CM | POA: Diagnosis not present

## 2015-11-02 DIAGNOSIS — I69354 Hemiplegia and hemiparesis following cerebral infarction affecting left non-dominant side: Secondary | ICD-10-CM | POA: Diagnosis not present

## 2015-11-02 DIAGNOSIS — Z79899 Other long term (current) drug therapy: Secondary | ICD-10-CM | POA: Diagnosis not present

## 2015-11-02 DIAGNOSIS — R509 Fever, unspecified: Secondary | ICD-10-CM | POA: Diagnosis not present

## 2015-11-02 DIAGNOSIS — Z791 Long term (current) use of non-steroidal anti-inflammatories (NSAID): Secondary | ICD-10-CM | POA: Diagnosis not present

## 2015-11-02 DIAGNOSIS — N390 Urinary tract infection, site not specified: Secondary | ICD-10-CM | POA: Diagnosis not present

## 2015-11-02 DIAGNOSIS — R531 Weakness: Secondary | ICD-10-CM | POA: Diagnosis not present

## 2015-11-02 DIAGNOSIS — Z87891 Personal history of nicotine dependence: Secondary | ICD-10-CM | POA: Diagnosis not present

## 2015-11-02 DIAGNOSIS — Z79891 Long term (current) use of opiate analgesic: Secondary | ICD-10-CM | POA: Diagnosis not present

## 2015-11-02 DIAGNOSIS — B9620 Unspecified Escherichia coli [E. coli] as the cause of diseases classified elsewhere: Secondary | ICD-10-CM | POA: Diagnosis present

## 2015-11-02 DIAGNOSIS — Z87440 Personal history of urinary (tract) infections: Secondary | ICD-10-CM | POA: Diagnosis not present

## 2015-11-02 DIAGNOSIS — Z981 Arthrodesis status: Secondary | ICD-10-CM | POA: Diagnosis not present

## 2015-11-02 DIAGNOSIS — Z7984 Long term (current) use of oral hypoglycemic drugs: Secondary | ICD-10-CM | POA: Diagnosis not present

## 2015-11-02 DIAGNOSIS — E785 Hyperlipidemia, unspecified: Secondary | ICD-10-CM | POA: Diagnosis present

## 2015-11-02 NOTE — Progress Notes (Signed)
PROGRESS NOTE                                                                                                                                                                                                             Patient Demographics:    Tammy SiasCindy Shepard, is a [age] y.o. female, DOB - 1950-04-17, ZOX:[id number]  Admit date - 10/31/2015   Admitting Physician Hillary BowJared M Gardner, DO  Outpatient Primary MD for the patient is Laurence AlyLaura C Gay, MD, MD  LOS - 0  Outpatient Specialists:none  Chief Complaint  Patient presents with  . Fever  . Weakness       Brief Narrative  [age] F with recurrent UTIs, HTN, CVA with fever, dysuria, weakness, suprapubic pain and dizziness. Found to have UTI and placed on observation.   Subjective:   Tammy Shepard   Assessment  & Plan :    Principal Problem:   Recurrent UTI empiric rocephin. Pending cx. Supportive care with  tylenol. DC fluids  Active Problems:   HTN (hypertension) Blood pressure meds held on admission due to soft BP. Now improved. Resume today.  CVA with residual left sided weakness ASA and statin   hypothyroidism Synthroid   Diabetes mellitus Continue metformin and SSI    Code Status : full code  Family Communication  : none at bedside  Disposition Plan  : PT evaluation. Patient wanting to go to a rest home. Will consult social work.  Barriers For Discharge : active symptoms  Consults  :  none  Procedures  : none  DVT Prophylaxis  :  Lovenox -   Lab Results  Component Value Date   PLT 137 (L) 11/01/2015    Antibiotics  :    Anti-infectives    Start     Dose/Rate Route Frequency Ordered Stop   11/01/15 2200  cefTRIAXone (ROCEPHIN) 1 g in dextrose 5 % 50 mL IVPB     1 g 100 mL/hr over 30 Minutes Intravenous Every 24 hours 10/31/15 2356     10/31/15 2145  cefTRIAXone (ROCEPHIN) 1 g in dextrose 5 % 50 mL IVPB   Status:  Discontinued     1 g 100 mL/hr over 30 Minutes Intravenous  Once 10/31/15 2133 10/31/15 2134   10/31/15 2145  cefTRIAXone (ROCEPHIN) 1 g in dextrose  5 % 50 mL IVPB     1 g 100 mL/hr over 30 Minutes Intravenous  Once 10/31/15 2141 10/31/15 2312        Objective:   Vitals:   11/01/15 0643 11/01/15 1317 11/01/15 2057 11/02/15 0629  BP:  122/64 (!) 149/68 (!) 167/79  Pulse:  94 94 100  Resp:  16 16 17   Temp:  98.7 F (37.1 C) (!) 100.4 F (38 C) 99.5 F (37.5 C)  TempSrc:  Oral Oral Oral  SpO2: 96% 98% 95% 90%  Weight:      Height:        Wt Readings from Last 3 Encounters:  11/01/15 78 kg (172 lb)  08/31/15 79.4 kg (175 lb)     Intake/Output Summary (Last 24 hours) at 11/02/15 1257 Last data filed at 11/02/15 0941  Gross per 24 hour  Intake          2882.08 ml  Output                0 ml  Net          2882.08 ml     Physical Exam  Gen: not in distress HEENT:  moist mucosa, supple neck Chest: clear b/l, no added sounds CVS: N S1&S2, no murmurs,  GI: soft, NT, ND Musculoskeletal: warm, no edema     Data Review:    CBC  Recent Labs Lab 10/31/15 2110 11/01/15 0518  WBC 11.5* 9.5  HGB 12.2 11.3*  HCT 37.6 35.9*  PLT 165 137*  MCV 96.7 98.1  MCH 31.4 30.9  MCHC 32.4 31.5  RDW 12.7 12.8  LYMPHSABS 1.2  --   MONOABS 1.4*  --   EOSABS 0.0  --   BASOSABS 0.0  --     Chemistries   Recent Labs Lab 10/31/15 2110 11/01/15 0518  NA 136 138  K 4.2 3.7  CL 106 109  CO2 25 21*  GLUCOSE 159* 115*  BUN 21* 20  CREATININE 1.19* 1.07*  CALCIUM 8.7* 8.2*  AST 14*  --   ALT 17  --   ALKPHOS 70  --   BILITOT 0.7  --    ------------------------------------------------------------------------------------------------------------------ No results for input(s): CHOL, HDL, LDLCALC, TRIG, CHOLHDL, LDLDIRECT in the last 72 hours.  Lab Results  Component Value Date   HGBA1C 6.1 08/08/2011    ------------------------------------------------------------------------------------------------------------------ No results for input(s): TSH, T4TOTAL, T3FREE, THYROIDAB in the last 72 hours.  Invalid input(s): FREET3 ------------------------------------------------------------------------------------------------------------------ No results for input(s): VITAMINB12, FOLATE, FERRITIN, TIBC, IRON, RETICCTPCT in the last 72 hours.  Coagulation profile No results for input(s): INR, PROTIME in the last 168 hours.  No results for input(s): DDIMER in the last 72 hours.  Cardiac Enzymes No results for input(s): CKMB, TROPONINI, MYOGLOBIN in the last 168 hours.  Invalid input(s): CK ------------------------------------------------------------------------------------------------------------------ No results found for: BNP  Inpatient Medications  Scheduled Meds: . aspirin EC  81 mg Oral Daily  . atorvastatin  40 mg Oral Daily  . cefTRIAXone (ROCEPHIN)  IV  1 g Intravenous Q24H  . enoxaparin (LOVENOX) injection  40 mg Subcutaneous Q24H  . FLUoxetine  60 mg Oral Daily  . gabapentin  600 mg Oral BID  . levothyroxine  75 mcg Oral QAC breakfast  . meloxicam  15 mg Oral Daily  . metFORMIN  500 mg Oral BID WC  . oxybutynin  10 mg Oral BID  . oxyCODONE  10 mg Oral Q12H  . pantoprazole  40 mg Oral Daily  .  polyethylene glycol  17 g Oral Daily  . traZODone  200 mg Oral QHS   Continuous Infusions:   PRN Meds:.acetaminophen, albuterol, clonazePAM, cyclobenzaprine, oxyCODONE, senna  Micro Results Recent Results (from the past 240 hour(s))  Blood Culture (routine x 2)     Status: None (Preliminary result)   Collection Time: 10/31/15  9:10 PM  Result Value Ref Range Status   Specimen Description BLOOD RIGHT HAND  Final   Special Requests BOTTLES DRAWN AEROBIC AND ANAEROBIC 5CC  Final   Culture NO GROWTH < 24 HOURS  Final   Report Status PENDING  Incomplete  Urine culture     Status:  Abnormal (Preliminary result)   Collection Time: 10/31/15  9:14 PM  Result Value Ref Range Status   Specimen Description URINE, RANDOM  Final   Special Requests NONE  Final   Culture >=100,000 COLONIES/mL ESCHERICHIA COLI (A)  Final   Report Status PENDING  Incomplete  Blood Culture (routine x 2)     Status: None (Preliminary result)   Collection Time: 10/31/15 10:06 PM  Result Value Ref Range Status   Specimen Description BLOOD RIGHT ANTECUBITAL  Final   Special Requests BOTTLES DRAWN AEROBIC AND ANAEROBIC 5CC  Final   Culture NO GROWTH < 24 HOURS  Final   Report Status PENDING  Incomplete    Radiology Reports Ct Head Wo Contrast  Result Date: 10/31/2015 CLINICAL DATA:  Acute onset of right-sided weakness and headache. Blurred vision and fever. Initial encounter. EXAM: CT HEAD WITHOUT CONTRAST TECHNIQUE: Contiguous axial images were obtained from the base of the skull through the vertex without intravenous contrast. COMPARISON:  None. FINDINGS: Brain: No evidence of acute infarction, hemorrhage, hydrocephalus, extra-axial collection or mass lesion/mass effect. A large chronic infarct is noted at the right occipital lobe. Additional chronic infarcts are seen at the high parietal lobes bilaterally, with associated encephalomalacia. Prominence of the ventricles and sulci reflects mild cortical volume loss. The brainstem and fourth ventricle are within normal limits. The basal ganglia are unremarkable in appearance. No mass effect or midline shift is seen. Vascular: No hyperdense vessel or unexpected calcification. Skull: There is no evidence of fracture; visualized osseous structures are unremarkable in appearance. Sinuses/Orbits: The orbits are within normal limits. The paranasal sinuses and mastoid air cells are well-aerated. Other: No significant soft tissue abnormalities are seen. IMPRESSION: 1. No acute intracranial pathology seen on CT. 2. Large chronic infarct at the right occipital lobe.  Additional chronic infarct at the high parietal lobes bilaterally, with associated encephalomalacia. 3. Mild cortical volume loss noted. Electronically Signed   By: Roanna Raider M.D.   On: 10/31/2015 23:44   Dg Chest Port 1 View  Result Date: 10/31/2015 CLINICAL DATA:  Acute onset of fever and cough.  Initial encounter. EXAM: PORTABLE CHEST 1 VIEW COMPARISON:  None. FINDINGS: The lungs are well-aerated. Minimal bibasilar atelectasis is noted. There is no evidence of pleural effusion or pneumothorax. The cardiomediastinal silhouette is borderline enlarged. No acute osseous abnormalities are seen. IMPRESSION: Minimal bibasilar atelectasis noted. Lungs otherwise clear. Borderline cardiomegaly. Electronically Signed   By: Roanna Raider M.D.   On: 10/31/2015 22:31    Time Spent in minutes  25   Eddie North M.D on 11/02/2015 at 12:57 PM  Between 7am to 7pm - Pager - [phone]  After 7pm go to [email or web address] - password [facility]  [facility] -  Office  [phone]

## 2015-11-03 LAB — URINE CULTURE: Culture: >=100000 — AB

## 2015-11-03 MED ORDER — AMLODIPINE BESYLATE 10 MG PO TABS
10.0000 mg | ORAL_TABLET | Freq: Every day | ORAL | Status: DC
Start: 2015-11-03 — End: 2015-11-07
  Administered 2015-11-03 – 2015-11-07 (×5): 10 mg via ORAL
  Filled 2015-11-03 (×5): qty 1

## 2015-11-03 MED ORDER — ONDANSETRON HCL 4 MG/2ML IJ SOLN
4.0000 mg | Freq: Four times a day (QID) | INTRAMUSCULAR | Status: DC | PRN
  Administered 2015-11-03 (×2): 4 mg via INTRAVENOUS
  Filled 2015-11-03 (×2): qty 2

## 2015-11-03 MED ORDER — VITAMIN B-12 1000 MCG PO TABS
1000.0000 ug | ORAL_TABLET | Freq: Every day | ORAL | Status: DC
  Administered 2015-11-03 – 2015-11-07 (×5): 1000 ug via ORAL
  Filled 2015-11-03 (×5): qty 1

## 2015-11-03 MED ORDER — LOSARTAN POTASSIUM 50 MG PO TABS
50.0000 mg | ORAL_TABLET | Freq: Two times a day (BID) | ORAL | Status: DC
  Administered 2015-11-03 – 2015-11-07 (×9): 50 mg via ORAL
  Filled 2015-11-03 (×9): qty 1

## 2015-11-03 NOTE — Progress Notes (Signed)
PROGRESS NOTE                                                                                                                                                                                                             Patient Demographics:    Tammy Shepard, is a 65 y.o. female, DOB - 1950/11/18, WUJ:[id number]  Admit date - 10/31/2015   Admitting Physician Hillary Bow, DO  Outpatient Primary MD for the patient is Laurence Aly, MD, MD  LOS - 1  Outpatient Specialists:none  Chief Complaint  Patient presents with  . Fever  . Weakness       Brief Narrative  9 F with recurrent UTIs, HTN, CVA with fever, dysuria, weakness, suprapubic pain and dizziness. Found to have UTI .   Subjective:   Dizziness much improved.   Assessment  & Plan :    Principal Problem:   Recurrent UTI empiric rocephin. Cultures growing Escherichia coli. Pending sensitivity.. Supportive care with  tylenol.   Active Problems:   HTN (hypertension) Blood pressure meds held on admission due to soft BP. Now stable and resumed  CVA with residual left sided weakness ASA and statin   hypothyroidism Synthroid   Diabetes mellitus Continue metformin and SSI    Code Status : full code  Family Communication  : none at bedside  Disposition Plan :  PT and recommends SNF. Social work consulted  Barriers For Discharge : Improving symptoms, possible discharge tomorrow  Consults  :  none  Procedures  : none  DVT Prophylaxis  :  Lovenox -   Lab Results  Component Value Date   PLT 137 (L) 11/01/2015    Antibiotics  :    Anti-infectives    Start     Dose/Rate Route Frequency Ordered Stop   11/01/15 2200  cefTRIAXone (ROCEPHIN) 1 g in dextrose 5 % 50 mL IVPB     1 g 100 mL/hr over 30 Minutes Intravenous Every 24 hours 10/31/15 2356     10/31/15 2145  cefTRIAXone (ROCEPHIN) 1 g in dextrose 5 % 50 mL IVPB  Status:  Discontinued       1 g 100 mL/hr over 30 Minutes Intravenous  Once 10/31/15 2133 10/31/15 2134   10/31/15 2145  cefTRIAXone (ROCEPHIN) 1 g in dextrose 5 % 50 mL IVPB  1 g 100 mL/hr over 30 Minutes Intravenous  Once 10/31/15 2141 10/31/15 2312        Objective:   Vitals:   11/02/15 0629 11/02/15 1343 11/02/15 2131 11/03/15 1426  BP: (!) 167/79 (!) 167/68 (!) 197/81 (!) 155/79  Pulse: 100 94 89 99  Resp: 17 16 16 17   Temp: 99.5 F (37.5 C) 98.9 F (37.2 C) 98.3 F (36.8 C) 98.6 F (37 C)  TempSrc: Oral Oral Oral Oral  SpO2: 90% 93% 95% 95%  Weight:      Height:        Wt Readings from Last 3 Encounters:  11/01/15 78 kg (172 lb)  08/31/15 79.4 kg (175 lb)     Intake/Output Summary (Last 24 hours) at 11/03/15 1441 Last data filed at 11/03/15 1429  Gross per 24 hour  Intake              650 ml  Output                0 ml  Net              650 ml     Physical Exam  Gen: not in distress HEENT:  moist mucosa, supple neck Chest: clear b/l, no added sounds CVS: N S1&S2, no murmurs,  GI: soft, NT, ND Musculoskeletal: warm, no edema     Data Review:    CBC  Recent Labs Lab 10/31/15 2110 11/01/15 0518  WBC 11.5* 9.5  HGB 12.2 11.3*  HCT 37.6 35.9*  PLT 165 137*  MCV 96.7 98.1  MCH 31.4 30.9  MCHC 32.4 31.5  RDW 12.7 12.8  LYMPHSABS 1.2  --   MONOABS 1.4*  --   EOSABS 0.0  --   BASOSABS 0.0  --     Chemistries   Recent Labs Lab 10/31/15 2110 11/01/15 0518  NA 136 138  K 4.2 3.7  CL 106 109  CO2 25 21*  GLUCOSE 159* 115*  BUN 21* 20  CREATININE 1.19* 1.07*  CALCIUM 8.7* 8.2*  AST 14*  --   ALT 17  --   ALKPHOS 70  --   BILITOT 0.7  --    ------------------------------------------------------------------------------------------------------------------ No results for input(s): CHOL, HDL, LDLCALC, TRIG, CHOLHDL, LDLDIRECT in the last 72 hours.  Lab Results  Component Value Date   HGBA1C 6.1 08/08/2011    ------------------------------------------------------------------------------------------------------------------ No results for input(s): TSH, T4TOTAL, T3FREE, THYROIDAB in the last 72 hours.  Invalid input(s): FREET3 ------------------------------------------------------------------------------------------------------------------ No results for input(s): VITAMINB12, FOLATE, FERRITIN, TIBC, IRON, RETICCTPCT in the last 72 hours.  Coagulation profile No results for input(s): INR, PROTIME in the last 168 hours.  No results for input(s): DDIMER in the last 72 hours.  Cardiac Enzymes No results for input(s): CKMB, TROPONINI, MYOGLOBIN in the last 168 hours.  Invalid input(s): CK ------------------------------------------------------------------------------------------------------------------ No results found for: BNP  Inpatient Medications  Scheduled Meds: . amLODipine  10 mg Oral Daily  . aspirin EC  81 mg Oral Daily  . atorvastatin  40 mg Oral Daily  . cefTRIAXone (ROCEPHIN)  IV  1 g Intravenous Q24H  . enoxaparin (LOVENOX) injection  40 mg Subcutaneous Q24H  . FLUoxetine  60 mg Oral Daily  . gabapentin  600 mg Oral BID  . levothyroxine  75 mcg Oral QAC breakfast  . losartan  50 mg Oral BID  . meloxicam  15 mg Oral Daily  . metFORMIN  500 mg Oral BID WC  . oxybutynin  10 mg  Oral BID  . oxyCODONE  10 mg Oral Q12H  . pantoprazole  40 mg Oral Daily  . polyethylene glycol  17 g Oral Daily  . traZODone  200 mg Oral QHS  . vitamin B-12  1,000 mcg Oral Daily   Continuous Infusions:   PRN Meds:.acetaminophen, albuterol, clonazePAM, cyclobenzaprine, ondansetron (ZOFRAN) IV, oxyCODONE, senna  Micro Results Recent Results (from the past 240 hour(s))  Blood Culture (routine x 2)     Status: None (Preliminary result)   Collection Time: 10/31/15  9:10 PM  Result Value Ref Range Status   Specimen Description BLOOD RIGHT HAND  Final   Special Requests BOTTLES DRAWN AEROBIC AND  ANAEROBIC 5CC  Final   Culture NO GROWTH 2 DAYS  Final   Report Status PENDING  Incomplete  Urine culture     Status: Abnormal   Collection Time: 10/31/15  9:14 PM  Result Value Ref Range Status   Specimen Description URINE, RANDOM  Final   Special Requests NONE  Final   Culture >=100,000 COLONIES/mL ESCHERICHIA COLI (A)  Final   Report Status 11/03/2015 FINAL  Final   Organism ID, Bacteria ESCHERICHIA COLI (A)  Final      Susceptibility   Escherichia coli - MIC*    AMPICILLIN >=32 RESISTANT Resistant     CEFAZOLIN 8 SENSITIVE Sensitive     CEFTRIAXONE <=1 SENSITIVE Sensitive     CIPROFLOXACIN 1 SENSITIVE Sensitive     GENTAMICIN <=1 SENSITIVE Sensitive     IMIPENEM <=0.25 SENSITIVE Sensitive     NITROFURANTOIN <=16 SENSITIVE Sensitive     TRIMETH/SULFA >=320 RESISTANT Resistant     AMPICILLIN/SULBACTAM >=32 RESISTANT Resistant     PIP/TAZO 8 SENSITIVE Sensitive     Extended ESBL NEGATIVE Sensitive     * >=100,000 COLONIES/mL ESCHERICHIA COLI  Blood Culture (routine x 2)     Status: None (Preliminary result)   Collection Time: 10/31/15 10:06 PM  Result Value Ref Range Status   Specimen Description BLOOD RIGHT ANTECUBITAL  Final   Special Requests BOTTLES DRAWN AEROBIC AND ANAEROBIC 5CC  Final   Culture NO GROWTH 2 DAYS  Final   Report Status PENDING  Incomplete    Radiology Reports Ct Head Wo Contrast  Result Date: 10/31/2015 CLINICAL DATA:  Acute onset of right-sided weakness and headache. Blurred vision and fever. Initial encounter. EXAM: CT HEAD WITHOUT CONTRAST TECHNIQUE: Contiguous axial images were obtained from the base of the skull through the vertex without intravenous contrast. COMPARISON:  None. FINDINGS: Brain: No evidence of acute infarction, hemorrhage, hydrocephalus, extra-axial collection or mass lesion/mass effect. A large chronic infarct is noted at the right occipital lobe. Additional chronic infarcts are seen at the high parietal lobes bilaterally, with  associated encephalomalacia. Prominence of the ventricles and sulci reflects mild cortical volume loss. The brainstem and fourth ventricle are within normal limits. The basal ganglia are unremarkable in appearance. No mass effect or midline shift is seen. Vascular: No hyperdense vessel or unexpected calcification. Skull: There is no evidence of fracture; visualized osseous structures are unremarkable in appearance. Sinuses/Orbits: The orbits are within normal limits. The paranasal sinuses and mastoid air cells are well-aerated. Other: No significant soft tissue abnormalities are seen. IMPRESSION: 1. No acute intracranial pathology seen on CT. 2. Large chronic infarct at the right occipital lobe. Additional chronic infarct at the high parietal lobes bilaterally, with associated encephalomalacia. 3. Mild cortical volume loss noted. Electronically Signed   By: Roanna RaiderJeffery  Chang M.D.   On: 10/31/2015 23:44  Dg Chest Port 1 View  Result Date: 10/31/2015 CLINICAL DATA:  Acute onset of fever and cough.  Initial encounter. EXAM: PORTABLE CHEST 1 VIEW COMPARISON:  None. FINDINGS: The lungs are well-aerated. Minimal bibasilar atelectasis is noted. There is no evidence of pleural effusion or pneumothorax. The cardiomediastinal silhouette is borderline enlarged. No acute osseous abnormalities are seen. IMPRESSION: Minimal bibasilar atelectasis noted. Lungs otherwise clear. Borderline cardiomegaly. Electronically Signed   By: Roanna Raider M.D.   On: 10/31/2015 22:31    Time Spent in minutes  25   Eddie North M.D on 11/03/2015 at 2:41 PM  Between 7am to 7pm - Pager - [phone]  After 7pm go to [email or web address] - password [facility]  [facility] -  Office  [phone]

## 2015-11-03 NOTE — Evaluation (Signed)
Physical Therapy Evaluation Patient Details Name: Tammy ModeCindy S Jurgensen MRN: [id number] DOB: [birth year] Today's Date: 11/03/2015   History of Present Illness  65 y.o. female admitted with UTI. PMH: HTN, CVA with Lt hemiparesis, diabetes, CHF, cervical fusion.   Clinical Impression  Pt mobilizing slowly with significant assistance during initial PT evaluation/treatment. Pt able to perform sit/stand transfers with +1 assist and +2 assist with stand pivot transfers. The pt states that she was awaiting bed availability at a SNF prior to admission. Based upon the patient's current mobility, recommend SNF following acute stay.    Follow Up Recommendations SNF;Supervision - Intermittent    Equipment Recommendations   (to be addressed at next venue)    Recommendations for Other Services       Precautions / Restrictions Precautions Precautions: Fall Required Braces or Orthoses: Other Brace/Splint Other Brace/Splint: Lt AFO Restrictions Weight Bearing Restrictions: No      Mobility  Bed Mobility Overal bed mobility: Needs Assistance;+2 for physical assistance Bed Mobility: Rolling;Sidelying to Sit Rolling: Max assist Sidelying to sit: +2 for physical assistance;Mod assist;Min assist       General bed mobility comments: HOB elevated, pt assisting with Rt UE and rail. Assist needed at trunk and to pivot fully to sitting EOB.   Transfers Overall transfer level: Needs assistance Equipment used: None Transfers: Sit to/from UGI CorporationStand;Stand Pivot Transfers Sit to Stand: Mod assist Stand pivot transfers: +2 physical assistance;Mod assist;Min assist (mod X1 + minX1)       General transfer comment: Difficulty with pt moving Lt LE during stand pivot transfer, physical assist needed at times for LLE placement. Verbal and tactile cues needed for upright standing and increased posterior lean with fatigue. Transfers performed from bed to chair. Standing [facility] behind  due to incontinence. Pt wearing Lt AFO for transfers.   Ambulation/Gait                Stairs            Wheelchair Mobility    Modified Rankin (Stroke Patients Only)       Balance Overall balance assessment: Needs assistance Sitting-balance support: Single extremity supported Sitting balance-Leahy Scale: Fair     Standing balance support: Single extremity supported Standing balance-Leahy Scale: Poor Standing balance comment: physical assist required                              Pertinent Vitals/Pain Pain Assessment: No/denies pain    Home Living Family/patient expects to be discharged to:: Skilled nursing facility                 Additional Comments: Pt previously living with her daughter but awaiting SNF bed availability prior to admission.     Prior Function Level of Independence: Needs assistance   Gait / Transfers Assistance Needed: Stand-pivot transfers with daughter's assist. Pt states that she was not ambulating and using a w/c for all mobility.   ADL's / Homemaking Assistance Needed: dependent on daughter's assistance, reports using BSC for toileting.         Hand Dominance        Extremity/Trunk Assessment   Upper Extremity Assessment: LUE deficits/detail       LUE Deficits / Details: no active motion noted. Flexion contractures noted through Lt UE.    Lower Extremity Assessment: LLE deficits/detail   LLE Deficits / Details: pt able to assist with Lt LE adduction  with bed mobility. Very limited active use.       Communication   Communication: No difficulties  Cognition Arousal/Alertness: Awake/alert Behavior During Therapy: Flat affect Overall Cognitive Status: Within Functional Limits for tasks assessed                      General Comments General comments (skin integrity, edema, etc.): Pt with incontinence of bowels X2 during session.     Exercises Other Exercises Other Exercises: Lt  dorsiflexion stretch   Assessment/Plan    PT Assessment Patient needs continued PT services  PT Problem List Decreased strength;Decreased range of motion;Decreased activity tolerance;Decreased balance;Decreased mobility          PT Treatment Interventions DME instruction;Functional mobility training;Therapeutic activities;Therapeutic exercise;Balance training;Neuromuscular re-education;Patient/family education    PT Goals (Current goals can be found in the Care Plan section)  Acute Rehab PT Goals Patient Stated Goal: go to nursing facility after the hospital PT Goal Formulation: With patient Time For Goal Achievement: 11/17/15 Potential to Achieve Goals: Fair    Frequency Min 2X/week   Barriers to discharge        Co-evaluation               End of Session Equipment Utilized During Treatment: Gait belt Activity Tolerance: Patient limited by fatigue Patient left: with call bell/phone within reach;with nursing/sitter in room (on [facility]) Nurse Communication: Mobility status         Time: 1610-9604 PT Time Calculation (min) (ACUTE ONLY): 38 min   Charges:   PT Evaluation $PT Eval Moderate Complexity: 1 Procedure PT Treatments $Therapeutic Activity: 23-37 mins   PT G Codes:        Christiane Ha, PT, CSCS Pager [phone] Office [phone]  11/03/2015, 2:10 PM

## 2015-11-04 DIAGNOSIS — R509 Fever, unspecified: Secondary | ICD-10-CM

## 2015-11-04 DIAGNOSIS — R531 Weakness: Secondary | ICD-10-CM

## 2015-11-04 DIAGNOSIS — I10 Essential (primary) hypertension: Secondary | ICD-10-CM | POA: Diagnosis present

## 2015-11-04 DIAGNOSIS — I959 Hypotension, unspecified: Secondary | ICD-10-CM | POA: Diagnosis present

## 2015-11-04 DIAGNOSIS — Z8673 Personal history of transient ischemic attack (TIA), and cerebral infarction without residual deficits: Secondary | ICD-10-CM

## 2015-11-04 MED ORDER — CEPHALEXIN 500 MG PO CAPS: 500.0000 mg | ORAL_CAPSULE | Freq: Two times a day (BID) | ORAL | 0 refills | Status: DC

## 2015-11-04 MED ORDER — OXYCODONE HCL 5 MG PO TABS: 5.0000 mg | ORAL_TABLET | Freq: Every day | ORAL | 0 refills | Status: DC | PRN

## 2015-11-04 MED ORDER — OXYCODONE HCL ER 10 MG PO T12A: 10.0000 mg | EXTENDED_RELEASE_TABLET | Freq: Two times a day (BID) | ORAL | 0 refills | Status: DC

## 2015-11-04 MED ORDER — CLONAZEPAM 0.5 MG PO TABS: 0.5000 mg | ORAL_TABLET | Freq: Two times a day (BID) | ORAL | 0 refills | Status: DC | PRN

## 2015-11-04 NOTE — Clinical Social Work Note (Signed)
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  No Need for family participation in patient care:  Yes (Comment) (patient choice)  Care giving concerns:  Patient is in need of SNF   Social Worker assessment / plan:  CSW spoke with patient who states PT is recommending SNF for STR.  Patient states she is from home with her daughter Tammy Shepard([place]).  Of note, patient's daughter, Tammy Shepard is POA though patient is alert and oriented at this time and able to make her own decisions.  Employment status:  Retired Health and safety inspectornsurance information:  Medicare PT Recommendations:  Skilled Nursing Facility Information / Referral to community resources:  Skilled Nursing Facility  Patient/Family's Response to care:  Patient is agreeable to SNF for [employer].  Patient/Family's Understanding of and Emotional Response to Diagnosis, Current Treatment, and Prognosis:  Patient and daughter relieved that Medicare will assist in paying for SNF at time of discharge.  Emotional Assessment Appearance:  Appears stated age Attitude/Demeanor/Rapport:    Affect (typically observed):   Accepting Orientation:  Oriented to Self, Oriented to Place, Oriented to  Time, Oriented to Situation Alcohol / Substance use:  Not Applicable Psych involvement (Current and /or in the community):  No (Comment)  Discharge Needs  Concerns to be addressed:  No discharge needs identified Readmission within the last 30 days:  No Current discharge risk:  None Barriers to Discharge:  Continued Medical Work up   [employer]le, Lathaniel Legate C, LCSW 11/04/2015, 9:53 AM

## 2015-11-04 NOTE — Clinical Social Work Note (Signed)
Patient has expired level II PASARR.  PASARR was initiated by [facility] on 10/23/2015.  CSW cannot open a new PASARR until this one has been cancelled by SNF.  CSW contacted and informed SNF of need for cancellation.  At this time, PASARR has not been received.  Patient cannot discharge to SNF until PASARR received.  CSW will continue working on PASARR for placement for patient at requested: [facility].  Vickii PennaGina Leaha Cuervo, MSW, LCSW  [phone]  Licensed Clinical Social Worker

## 2015-11-04 NOTE — Discharge Summary (Signed)
Physician Discharge Summary  Tammy Shepard NWG:[id number] DOB: 1950/12/30 DOA: 10/31/2015  PCP: Laurence Aly, MD, MD  Admit date: 10/31/2015 Discharge date: 11/04/2015  Admitted From: Home Disposition:  SNF  Recommendations for Outpatient Follow-up:  Follow up with M.D. at SNF in 1 week. Completes 7 days of antibiotics on 11/09/2015   Home Health: None Equipment/Devices: As per therapy at SNF  Discharge Condition: stable CODE STATUS: Full code Diet recommendation: Heart healthy   Discharge Diagnoses:  Principal Problem:   Recurrent UTI  Active Problems:   Fever  Escherichia coli UTI (lower urinary tract infection)   H/O: CVA (cerebrovascular accident)   Hypotension   Essential hypertension, benign  Brief narrative/  history of present illness Please refer to admission H&P for details, in brief, 44 F with recurrent UTIs, HTN, CVA with residual left-sided weakness presented with with fever, dysuria, weakness, suprapubic pain and dizziness. Found to have UTI with low blood pressure.  Hospital course  Principal Problem:   Recurrent UTI Placed on empiric rocephin. Cultures growing Escherichia coli sensitive to cephalosporins. We discharged on oral Keflex completed 7 day course of antibiotic.  Active Problems:   HTN (hypertension) Blood pressure meds held on admission due to soft BP. Now improved and IV hydration. Medications resumed.  CVA with residual left sided weakness ASA and statin   hypothyroidism Synthroid   Diabetes mellitus Continue metformin.  Generalized weakness Seen by PT and recommended SNF.  Code Status : full code  Family Communication  :  discussed with daughter on the phone on 9/25.  Disposition Plan  : SNF    Consults  :  none  Procedures  : none   Discharge Instructions     Medication List    TAKE these medications   albuterol 108 (90 Base) MCG/ACT inhaler Commonly known as:  PROVENTIL HFA;VENTOLIN  HFA Inhale 2 puffs into the lungs daily as needed for shortness of breath.   albuterol (2.5 MG/3ML) 0.083% nebulizer solution Commonly known as:  PROVENTIL Take 2.5 mg by nebulization every 8 (eight) hours as needed for shortness of breath.   amLODipine 10 MG tablet Commonly known as:  NORVASC Take 10 mg by mouth daily.   aspirin EC 81 MG tablet Take 81 mg by mouth daily.   atorvastatin 40 MG tablet Commonly known as:  LIPITOR Take 40 mg by mouth every morning.   cephALEXin 500 MG capsule Commonly known as:  KEFLEX Take 1 capsule (500 mg total) by mouth 2 (two) times daily.   clonazePAM 0.5 MG tablet Commonly known as:  KLONOPIN Take 1 tablet (0.5 mg total) by mouth 2 (two) times daily as needed for anxiety.   cloNIDine 0.3 MG tablet Commonly known as:  CATAPRES Take 0.3 mg by mouth 2 (two) times daily.   cyclobenzaprine 5 MG tablet Commonly known as:  FLEXERIL Take 5 mg by mouth 2 (two) times daily as needed for muscle spasms.   FLUoxetine 20 MG capsule Commonly known as:  PROZAC Take 60 mg by mouth daily.   gabapentin 300 MG capsule Commonly known as:  NEURONTIN Take 600 mg by mouth 2 (two) times daily.   levothyroxine 75 MCG tablet Commonly known as:  SYNTHROID, LEVOTHROID Take 75 mcg by mouth daily before breakfast.   lidocaine 5 % ointment Commonly known as:  XYLOCAINE Apply 1 application topically 3 (three) times daily as needed for pain.   losartan 50 MG tablet Commonly known as:  COZAAR Take 50 mg by mouth 2 (  two) times daily.   meloxicam 15 MG tablet Commonly known as:  MOBIC Take 15 mg by mouth daily.   metFORMIN 500 MG tablet Commonly known as:  GLUCOPHAGE Take 500 mg by mouth 2 (two) times daily.   omeprazole 20 MG capsule Commonly known as:  PRILOSEC Take 20 mg by mouth daily.   oxybutynin 5 MG tablet Commonly known as:  DITROPAN Take 10 mg by mouth 2 (two) times daily.   oxyCODONE 5 MG immediate release tablet Commonly known as:   Oxy IR/ROXICODONE Take 1 tablet (5 mg total) by mouth daily as needed.   oxyCODONE 10 mg 12 hr tablet Commonly known as:  OXYCONTIN Take 1 tablet (10 mg total) by mouth every 12 (twelve) hours.   polyethylene glycol packet Commonly known as:  MIRALAX / GLYCOLAX Take 17 g by mouth daily.   RA VITAMIN B-12 TR 1000 MCG Tbcr Generic drug:  Cyanocobalamin Take 1,000 mcg by mouth daily.   senna 8.6 MG tablet Commonly known as:  SENOKOT Take 2 tablets by mouth 2 (two) times daily as needed for constipation.   traZODone 100 MG tablet Commonly known as:  DESYREL Take 200 mg by mouth at bedtime.   trimethoprim 100 MG tablet Commonly known as:  TRIMPEX Take 100 mg by mouth daily.      Follow-up Information    MD at SNF in 1 week .          Allergies  Allergen Reactions  . Amoxicillin Rash    Tolerating rocephin 10/29/15  . Ciprofloxacin Anaphylaxis  . Clindamycin Hcl Swelling, Rash and Other (See Comments)    Other Reaction: BAD RASH AND SWELLING, SKIN BR Severe rash and swelling. Skin break down.  Marland Kitchen Penicillins Anaphylaxis    Tolerating rocephin 11/01/15  . Codeine Nausea And Vomiting  . Baclofen Itching and Rash  . Contrast Media [Iodinated Diagnostic Agents] Itching and Rash  . Erythromycin Rash  . Iodides Rash    Povidone iodine; betadine  . Latex Itching and Rash  . Nickel Rash and Other (See Comments)    OTHER REACTION  . Soap Itching and Rash  . Sulfa Antibiotics Rash  . Tape Rash    (silk tape only) skin break down  . Tolterodine Rash  . Vancomycin Rash      Procedures/Studies: Ct Head Wo Contrast  Result Date: 10/31/2015 CLINICAL DATA:  Acute onset of right-sided weakness and headache. Blurred vision and fever. Initial encounter. EXAM: CT HEAD WITHOUT CONTRAST TECHNIQUE: Contiguous axial images were obtained from the base of the skull through the vertex without intravenous contrast. COMPARISON:  None. FINDINGS: Brain: No evidence of acute infarction,  hemorrhage, hydrocephalus, extra-axial collection or mass lesion/mass effect. A large chronic infarct is noted at the right occipital lobe. Additional chronic infarcts are seen at the high parietal lobes bilaterally, with associated encephalomalacia. Prominence of the ventricles and sulci reflects mild cortical volume loss. The brainstem and fourth ventricle are within normal limits. The basal ganglia are unremarkable in appearance. No mass effect or midline shift is seen. Vascular: No hyperdense vessel or unexpected calcification. Skull: There is no evidence of fracture; visualized osseous structures are unremarkable in appearance. Sinuses/Orbits: The orbits are within normal limits. The paranasal sinuses and mastoid air cells are well-aerated. Other: No significant soft tissue abnormalities are seen. IMPRESSION: 1. No acute intracranial pathology seen on CT. 2. Large chronic infarct at the right occipital lobe. Additional chronic infarct at the high parietal lobes bilaterally, with associated encephalomalacia.  3. Mild cortical volume loss noted. Electronically Signed   By: Roanna Raider M.D.   On: 10/31/2015 23:44   Dg Chest Port 1 View  Result Date: 10/31/2015 CLINICAL DATA:  Acute onset of fever and cough.  Initial encounter. EXAM: PORTABLE CHEST 1 VIEW COMPARISON:  None. FINDINGS: The lungs are well-aerated. Minimal bibasilar atelectasis is noted. There is no evidence of pleural effusion or pneumothorax. The cardiomediastinal silhouette is borderline enlarged. No acute osseous abnormalities are seen. IMPRESSION: Minimal bibasilar atelectasis noted. Lungs otherwise clear. Borderline cardiomegaly. Electronically Signed   By: Roanna Raider M.D.   On: 10/31/2015 22:31       Subjective: Feels better, no overnight issues  Discharge Exam: Vitals:   11/03/15 2119 11/04/15 0500  BP: (!) 145/79 (!) 169/79  Pulse: 87 84  Resp: 16 17  Temp: 98.2 F (36.8 C) 98.9 F (37.2 C)   Vitals:   11/02/15  2131 11/03/15 1426 11/03/15 2119 11/04/15 0500  BP: (!) 197/81 (!) 155/79 (!) 145/79 (!) 169/79  Pulse: 89 99 87 84  Resp: 16 17 16 17   Temp: 98.3 F (36.8 C) 98.6 F (37 C) 98.2 F (36.8 C) 98.9 F (37.2 C)  TempSrc: Oral Oral Oral Oral  SpO2: 95% 95% 94% 96%  Weight:      Height:         Gen: not in distress HEENT:  moist mucosa, supple neck Chest: clear b/l, no added sounds CVS: N S1&S2, no murmurs,  GI: soft, NT, ND Musculoskeletal: warm, no edema CNS: Alert and oriented, left hemiparesis    The results of significant diagnostics from this hospitalization (including imaging, microbiology, ancillary and laboratory) are listed below for reference.     Microbiology: Recent Results (from the past 240 hour(s))  Blood Culture (routine x 2)     Status: None (Preliminary result)   Collection Time: 10/31/15  9:10 PM  Result Value Ref Range Status   Specimen Description BLOOD RIGHT HAND  Final   Special Requests BOTTLES DRAWN AEROBIC AND ANAEROBIC 5CC  Final   Culture NO GROWTH 3 DAYS  Final   Report Status PENDING  Incomplete  Urine culture     Status: Abnormal   Collection Time: 10/31/15  9:14 PM  Result Value Ref Range Status   Specimen Description URINE, RANDOM  Final   Special Requests NONE  Final   Culture >=100,000 COLONIES/mL ESCHERICHIA COLI (A)  Final   Report Status 11/03/2015 FINAL  Final   Organism ID, Bacteria ESCHERICHIA COLI (A)  Final      Susceptibility   Escherichia coli - MIC*    AMPICILLIN >=32 RESISTANT Resistant     CEFAZOLIN 8 SENSITIVE Sensitive     CEFTRIAXONE <=1 SENSITIVE Sensitive     CIPROFLOXACIN 1 SENSITIVE Sensitive     GENTAMICIN <=1 SENSITIVE Sensitive     IMIPENEM <=0.25 SENSITIVE Sensitive     NITROFURANTOIN <=16 SENSITIVE Sensitive     TRIMETH/SULFA >=320 RESISTANT Resistant     AMPICILLIN/SULBACTAM >=32 RESISTANT Resistant     PIP/TAZO 8 SENSITIVE Sensitive     Extended ESBL NEGATIVE Sensitive     * >=100,000 COLONIES/mL  ESCHERICHIA COLI  Blood Culture (routine x 2)     Status: None (Preliminary result)   Collection Time: 10/31/15 10:06 PM  Result Value Ref Range Status   Specimen Description BLOOD RIGHT ANTECUBITAL  Final   Special Requests BOTTLES DRAWN AEROBIC AND ANAEROBIC 5CC  Final   Culture NO GROWTH 3 DAYS  Final   Report Status PENDING  Incomplete     Labs: BNP (last 3 results) No results for input(s): BNP in the last 8760 hours. Basic Metabolic Panel:  Recent Labs Lab 10/31/15 2110 11/01/15 0518  NA 136 138  K 4.2 3.7  CL 106 109  CO2 25 21*  GLUCOSE 159* 115*  BUN 21* 20  CREATININE 1.19* 1.07*  CALCIUM 8.7* 8.2*   Liver Function Tests:  Recent Labs Lab 10/31/15 2110  AST 14*  ALT 17  ALKPHOS 70  BILITOT 0.7  PROT 6.1*  ALBUMIN 3.0*   No results for input(s): LIPASE, AMYLASE in the last 168 hours. No results for input(s): AMMONIA in the last 168 hours. CBC:  Recent Labs Lab 10/31/15 2110 11/01/15 0518  WBC 11.5* 9.5  NEUTROABS 8.9*  --   HGB 12.2 11.3*  HCT 37.6 35.9*  MCV 96.7 98.1  PLT 165 137*   Cardiac Enzymes: No results for input(s): CKTOTAL, CKMB, CKMBINDEX, TROPONINI in the last 168 hours. BNP: Invalid input(s): POCBNP CBG:  Recent Labs Lab 10/31/15 2105  GLUCAP 158*   D-Dimer No results for input(s): DDIMER in the last 72 hours. Hgb A1c No results for input(s): HGBA1C in the last 72 hours. Lipid Profile No results for input(s): CHOL, HDL, LDLCALC, TRIG, CHOLHDL, LDLDIRECT in the last 72 hours. Thyroid function studies No results for input(s): TSH, T4TOTAL, T3FREE, THYROIDAB in the last 72 hours.  Invalid input(s): FREET3 Anemia work up No results for input(s): VITAMINB12, FOLATE, FERRITIN, TIBC, IRON, RETICCTPCT in the last 72 hours. Urinalysis    Component Value Date/Time   COLORURINE YELLOW 10/31/2015 2114   APPEARANCEUR TURBID (A) 10/31/2015 2114   APPEARANCEUR Cloudy 08/18/2011 1018   LABSPEC 1.021 10/31/2015 2114   LABSPEC  1.017 08/18/2011 1018   PHURINE 7.0 10/31/2015 2114   GLUCOSEU NEGATIVE 10/31/2015 2114   GLUCOSEU Negative 08/18/2011 1018   HGBUR MODERATE (A) 10/31/2015 2114   BILIRUBINUR NEGATIVE 10/31/2015 2114   BILIRUBINUR Negative 08/18/2011 1018   KETONESUR 15 (A) 10/31/2015 2114   PROTEINUR 30 (A) 10/31/2015 2114   NITRITE POSITIVE (A) 10/31/2015 2114   LEUKOCYTESUR LARGE (A) 10/31/2015 2114   LEUKOCYTESUR 3+ 08/18/2011 1018   Sepsis Labs Invalid input(s): PROCALCITONIN,  WBC,  LACTICIDVEN Microbiology Recent Results (from the past 240 hour(s))  Blood Culture (routine x 2)     Status: None (Preliminary result)   Collection Time: 10/31/15  9:10 PM  Result Value Ref Range Status   Specimen Description BLOOD RIGHT HAND  Final   Special Requests BOTTLES DRAWN AEROBIC AND ANAEROBIC 5CC  Final   Culture NO GROWTH 3 DAYS  Final   Report Status PENDING  Incomplete  Urine culture     Status: Abnormal   Collection Time: 10/31/15  9:14 PM  Result Value Ref Range Status   Specimen Description URINE, RANDOM  Final   Special Requests NONE  Final   Culture >=100,000 COLONIES/mL ESCHERICHIA COLI (A)  Final   Report Status 11/03/2015 FINAL  Final   Organism ID, Bacteria ESCHERICHIA COLI (A)  Final      Susceptibility   Escherichia coli - MIC*    AMPICILLIN >=32 RESISTANT Resistant     CEFAZOLIN 8 SENSITIVE Sensitive     CEFTRIAXONE <=1 SENSITIVE Sensitive     CIPROFLOXACIN 1 SENSITIVE Sensitive     GENTAMICIN <=1 SENSITIVE Sensitive     IMIPENEM <=0.25 SENSITIVE Sensitive     NITROFURANTOIN <=16 SENSITIVE Sensitive     TRIMETH/SULFA >=  320 RESISTANT Resistant     AMPICILLIN/SULBACTAM >=32 RESISTANT Resistant     PIP/TAZO 8 SENSITIVE Sensitive     Extended ESBL NEGATIVE Sensitive     * >=100,000 COLONIES/mL ESCHERICHIA COLI  Blood Culture (routine x 2)     Status: None (Preliminary result)   Collection Time: 10/31/15 10:06 PM  Result Value Ref Range Status   Specimen Description BLOOD RIGHT  ANTECUBITAL  Final   Special Requests BOTTLES DRAWN AEROBIC AND ANAEROBIC 5CC  Final   Culture NO GROWTH 3 DAYS  Final   Report Status PENDING  Incomplete     Time coordinating discharge: <30 minutes  SIGNED:   Eddie North, MD  [facility] 11/04/2015, 10:11 AM Pager   If 7PM-7AM, please contact night-coverage [email or web address] Password [email or web address]

## 2015-11-04 NOTE — NC FL2 (Signed)
MEDICAID FL2 LEVEL OF CARE SCREENING TOOL     IDENTIFICATION  Patient Name: Tammy Shepard Birthdate: 1950/06/29 Sex: female Admission Date (Current Location): 10/31/2015  [facility] and [place] Number:  [employer] and Address:  [facility], [address], [place], [place] [address]      Provider Number: [id number]  Attending Physician Name and Address:  Eddie North, MD  Relative Name and Phone Number:       Current Level of Care: Hospital Recommended Level of Care: Skilled Nursing Facility Prior Approval Number:    Date Approved/Denied:   PASRR Number:    Discharge Plan:      Current Diagnoses: Patient Active Problem List   Diagnosis Date Noted  . H/O: CVA (cerebrovascular accident) 11/04/2015  . Hypotension 11/04/2015  . Essential hypertension, benign 11/04/2015  . Fever in adult   . Generalized weakness   . 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 Fever 10/31/2015  . UTI (lower urinary tract infection) 10/31/2015    Orientation RESPIRATION BLADDER Height & Weight     Self, Time, Situation  Normal Continent Weight: 172 lb (78 kg) Height:  5\' 4"  (162.6 cm)  BEHAVIORAL SYMPTOMS/MOOD NEUROLOGICAL BOWEL NUTRITION STATUS      Continent    AMBULATORY STATUS COMMUNICATION OF NEEDS Skin   Limited Assist Verbally Normal                       Personal Care Assistance Level of Assistance  Bathing, Dressing Bathing Assistance: Limited assistance   Dressing Assistance: Limited assistance     Functional Limitations Info             SPECIAL CARE FACTORS FREQUENCY  PT (By licensed PT)     PT Frequency: daily OT Frequency: daily            Contractures Contractures Info: Not present    Additional Factors Info  Allergies   Allergies Info: Amoxicillin, Ciprofloxacin, Clindamycin Hcl, Penicillins, Codeine, Baclofen, Contrast Media Iodinated Diagnostic Agents, Erythromycin, Iodides, Latex, Nickel, Soap, Sulfa  Antibiotics, Tape, Tolterodine, Vancomycin           Current Medications (11/04/2015):  This is the current hospital active medication list Current Facility-Administered Medications  Medication Dose Route Frequency Provider Last Rate Last Dose  . acetaminophen (TYLENOL) tablet 650 mg  650 mg Oral Q6H PRN Hillary Bow, DO   650 mg at 11/01/15 1807  . albuterol (PROVENTIL) (2.5 MG/3ML) 0.083% nebulizer solution 2.5 mg  2.5 mg Nebulization Q8H PRN Hillary Bow, DO      . amLODipine (NORVASC) tablet 10 mg  10 mg Oral Daily Nishant Dhungel, MD   10 mg at 11/04/15 1029  . aspirin EC tablet 81 mg  81 mg Oral Daily Hillary Bow, DO   81 mg at 11/04/15 1029  . atorvastatin (LIPITOR) tablet 40 mg  40 mg Oral Daily Hillary Bow, DO   40 mg at 11/04/15 1029  . cefTRIAXone (ROCEPHIN) 1 g in dextrose 5 % 50 mL IVPB  1 g Intravenous Q24H Hillary Bow, DO   1 g at 11/03/15 2134  . clonazePAM (KLONOPIN) tablet 0.5 mg  0.5 mg Oral BID PRN Hillary Bow, DO   0.5 mg at 11/02/15 2125  . cyclobenzaprine (FLEXERIL) tablet 5 mg  5 mg Oral BID PRN Hillary Bow, DO   5 mg at 11/01/15 2204  . enoxaparin (LOVENOX) injection 40 mg  40 mg Subcutaneous Q24H Hillary BowJared M Gardner, DO   40 mg at 11/03/15 1413  . FLUoxetine (PROZAC) capsule 60 mg  60 mg Oral Daily Hillary BowJared M Gardner, DO   60 mg at 11/04/15 1029  . gabapentin (NEURONTIN) capsule 600 mg  600 mg Oral BID Hillary BowJared M Gardner, DO   600 mg at 11/04/15 1029  . levothyroxine (SYNTHROID, LEVOTHROID) tablet 75 mcg  75 mcg Oral QAC breakfast Hillary BowJared M Gardner, DO   75 mcg at 11/04/15 1032  . losartan (COZAAR) tablet 50 mg  50 mg Oral BID Nishant Dhungel, MD   50 mg at 11/04/15 1029  . meloxicam (MOBIC) tablet 15 mg  15 mg Oral Daily Hillary BowJared M Gardner, DO   15 mg at 11/04/15 1032  . metFORMIN (GLUCOPHAGE) tablet 500 mg  500 mg Oral BID WC Hillary BowJared M Gardner, DO   500 mg at 11/04/15 1029  . ondansetron (ZOFRAN) injection 4 mg  4 mg Intravenous Q6H PRN Nishant Dhungel, MD    4 mg at 11/03/15 2107  . oxybutynin (DITROPAN) tablet 10 mg  10 mg Oral BID Hillary BowJared M Gardner, DO   10 mg at 11/04/15 1029  . oxyCODONE (Oxy IR/ROXICODONE) immediate release tablet 5 mg  5 mg Oral Q8H PRN Hillary BowJared M Gardner, DO   5 mg at 11/01/15 1603  . oxyCODONE (OXYCONTIN) 12 hr tablet 10 mg  10 mg Oral Q12H Hillary BowJared M Gardner, DO   10 mg at 11/04/15 1029  . pantoprazole (PROTONIX) EC tablet 40 mg  40 mg Oral Daily Hillary BowJared M Gardner, DO   40 mg at 11/04/15 1029  . polyethylene glycol (MIRALAX / GLYCOLAX) packet 17 g  17 g Oral Daily Hillary BowJared M Gardner, DO   17 g at 11/04/15 1028  . senna (SENOKOT) tablet 17.2 mg  2 tablet Oral BID PRN Hillary BowJared M Gardner, DO   17.2 mg at 11/02/15 2124  . traZODone (DESYREL) tablet 200 mg  200 mg Oral QHS Hillary BowJared M Gardner, DO   200 mg at 11/03/15 2135  . vitamin B-12 (CYANOCOBALAMIN) tablet 1,000 mcg  1,000 mcg Oral Daily Nishant Dhungel, MD   1,000 mcg at 11/04/15 1029     Discharge Medications: Please see discharge summary for a list of discharge medications.  Relevant Imaging Results:  Relevant Lab Results:   Additional Information SSN: [id number]  Rondel Batonngle, Bertrum Helmstetter C, LCSW

## 2015-11-04 NOTE — Clinical Social Work Placement (Signed)
   CLINICAL SOCIAL WORK PLACEMENT  NOTE  Date:  11/04/2015  Patient Details  Name: Tammy Shepard MRN: [id number] Date of Birth: 1950/08/09  Clinical Social Work is seeking post-discharge placement for this patient at the Skilled  Nursing Facility level of care (*CSW will initial, date and re-position this form in  chart as items are completed):  Yes   Patient/family provided with [facility] Clinical Social Work Department's list of facilities offering this level of care within the geographic area requested by the patient (or if unable, by the patient's family).  Yes   Patient/family informed of their freedom to choose among providers that offer the needed level of care, that participate in Medicare, Medicaid or managed care program needed by the patient, have an available bed and are willing to accept the patient.  Yes   Patient/family informed of [facility]'s ownership interest in [facility] and [facility], as well as of the fact that they are under no obligation to receive care at these facilities.  PASRR submitted to EDS on 11/04/15     PASRR number received on       Existing PASRR number confirmed on       FL2 transmitted to all facilities in geographic area requested by pt/family on 11/04/15     FL2 transmitted to all facilities within larger geographic area on       Patient informed that his/her managed care company has contracts with or will negotiate with certain facilities, including the following:            Patient/family informed of bed offers received.  Patient chooses bed at       Physician recommends and patient chooses bed at      Patient to be transferred to   on  .  Patient to be transferred to facility by       Patient family notified on   of transfer.  Name of family member notified:        PHYSICIAN       Additional Comment:    _______________________________________________ Rondel BatonIngle, Telvin Reinders C, LCSW 11/04/2015, 10:38 AM

## 2015-11-05 LAB — CULTURE, BLOOD (ROUTINE X 2)
CULTURE: NO GROWTH
Culture: NO GROWTH

## 2015-11-05 MED ORDER — CEPHALEXIN 500 MG PO CAPS
500.0000 mg | ORAL_CAPSULE | Freq: Once | ORAL | Status: AC
Start: 2015-11-05 — End: 2015-11-05
  Administered 2015-11-05: 500 mg via ORAL
  Filled 2015-11-05: qty 1

## 2015-11-05 NOTE — Discharge Summary (Signed)
Physician Discharge Summary  Tammy Shepard ZOX:[id number] DOB: 06-Feb-1951 DOA: 10/31/2015  PCP: Laurence Aly, MD  Admit date: 10/31/2015 Discharge date: 11/05/2015  Admitted From: Home Disposition:  SNF  Recommendations for Outpatient Follow-up:  Follow up with M.D. at SNF in 1 week. Completes 7 days of antibiotics on 11/09/2015   Home Health: None Equipment/Devices: As per therapy at SNF  Discharge Condition: stable CODE STATUS: Full code Diet recommendation: Heart healthy   Discharge Diagnoses:  Principal Problem:   Recurrent UTI  Active Problems:   Fever  Escherichia coli UTI (lower urinary tract infection)   H/O: CVA (cerebrovascular accident)   Hypotension   Essential hypertension, benign  Brief narrative/  history of present illness Please refer to admission H&P for details, in brief, 34 F with recurrent UTIs, HTN, CVA with residual left-sided weakness presented with with fever, dysuria, weakness, suprapubic pain and dizziness. Found to have UTI with low blood pressure.  Hospital course Escherichia coli urinary tract infection Placed on empiric rocephin. Cultures growing Escherichia coli sensitive to cephalosporins. Escherichia coli sensitive to following medications as displayed in the table. Resistant to everything else . Continue oral Keflex for another 7 days  Escherichia coli sensitivity as follows CEFAZOLIN 8 SENSITIVE  Sensitive     CEFTRIAXONE <=1 SENSITIVE "><=1 SENSITIVE  Sensitive    CIPROFLOXACIN 1 SENSITIVE  Sensitive    Extended ESBL NEGATIVE  Sensitive    GENTAMICIN <=1 SENSITIVE "><=1 SENSITIVE  Sensitive    IMIPENEM <=0.25 SENSITIVE "><=0.25 SENS... Sensitive    NITROFURANTOIN <=16 SENSITIVE "><=16 SENSIT... Sensitive    PIP/TAZO 8 SENSITIVE  Sensitive          HTN (hypertension) Blood pressure meds held on admission due to soft BP. Now improved and IV hydration. Medications resumed.  CVA with residual left sided weakness ASA and  statin   hypothyroidism Synthroid   Diabetes mellitus Continue metformin.  Generalized weakness Seen by PT and recommended SNF.  Code Status : full code  Family Communication  :  discussed with daughter on the phone on 9/25.  Disposition Plan  : SNF    Consults  :  none  Procedures  : none   Discharge Instructions  Discharge Instructions    Diet - low sodium heart healthy    Complete by:  As directed    Increase activity slowly    Complete by:  As directed        Medication List    TAKE these medications   albuterol 108 (90 Base) MCG/ACT inhaler Commonly known as:  PROVENTIL HFA;VENTOLIN HFA Inhale 2 puffs into the lungs daily as needed for shortness of breath.   albuterol (2.5 MG/3ML) 0.083% nebulizer solution Commonly known as:  PROVENTIL Take 2.5 mg by nebulization every 8 (eight) hours as needed for shortness of breath.   amLODipine 10 MG tablet Commonly known as:  NORVASC Take 10 mg by mouth daily.   aspirin EC 81 MG tablet Take 81 mg by mouth daily.   atorvastatin 40 MG tablet Commonly known as:  LIPITOR Take 40 mg by mouth every morning.   cephALEXin 500 MG capsule Commonly known as:  KEFLEX Take 1 capsule (500 mg total) by mouth 2 (two) times daily.   clonazePAM 0.5 MG tablet Commonly known as:  KLONOPIN Take 1 tablet (0.5 mg total) by mouth 2 (two) times daily as needed for anxiety.   cloNIDine 0.3 MG tablet Commonly known as:  CATAPRES Take 0.3 mg by mouth 2 (two)  times daily.   cyclobenzaprine 5 MG tablet Commonly known as:  FLEXERIL Take 5 mg by mouth 2 (two) times daily as needed for muscle spasms.   FLUoxetine 20 MG capsule Commonly known as:  PROZAC Take 60 mg by mouth daily.   gabapentin 300 MG capsule Commonly known as:  NEURONTIN Take 600 mg by mouth 2 (two) times daily.   levothyroxine 75 MCG tablet Commonly known as:  SYNTHROID, LEVOTHROID Take 75 mcg by mouth daily before breakfast.   lidocaine 5  % ointment Commonly known as:  XYLOCAINE Apply 1 application topically 3 (three) times daily as needed for pain.   losartan 50 MG tablet Commonly known as:  COZAAR Take 50 mg by mouth 2 (two) times daily.   meloxicam 15 MG tablet Commonly known as:  MOBIC Take 15 mg by mouth daily.   metFORMIN 500 MG tablet Commonly known as:  GLUCOPHAGE Take 500 mg by mouth 2 (two) times daily.   omeprazole 20 MG capsule Commonly known as:  PRILOSEC Take 20 mg by mouth daily.   oxybutynin 5 MG tablet Commonly known as:  DITROPAN Take 10 mg by mouth 2 (two) times daily.   oxyCODONE 5 MG immediate release tablet Commonly known as:  Oxy IR/ROXICODONE Take 1 tablet (5 mg total) by mouth daily as needed.   oxyCODONE 10 mg 12 hr tablet Commonly known as:  OXYCONTIN Take 1 tablet (10 mg total) by mouth every 12 (twelve) hours.   polyethylene glycol packet Commonly known as:  MIRALAX / GLYCOLAX Take 17 g by mouth daily.   RA VITAMIN B-12 TR 1000 MCG Tbcr Generic drug:  Cyanocobalamin Take 1,000 mcg by mouth daily.   senna 8.6 MG tablet Commonly known as:  SENOKOT Take 2 tablets by mouth 2 (two) times daily as needed for constipation.   traZODone 100 MG tablet Commonly known as:  DESYREL Take 200 mg by mouth at bedtime.   trimethoprim 100 MG tablet Commonly known as:  TRIMPEX Take 100 mg by mouth daily.      Follow-up Information    MD at SNF in 1 week .        Laurence Aly, MD. Schedule an appointment as soon as possible for a visit in 2 day(s).   Specialty:  Family Medicine Why:  Hospital follow-up Contact information: [id number] [facility] [place] [address] [phone]          Allergies  Allergen Reactions  . Amoxicillin Rash    Tolerating rocephin 10/29/15  . Ciprofloxacin Anaphylaxis  . Clindamycin Hcl Swelling, Rash and Other (See Comments)    Other Reaction: BAD RASH AND SWELLING, SKIN BR Severe rash and swelling. Skin  break down.  Marland Kitchen Penicillins Anaphylaxis    Tolerating rocephin 11/01/15  . Codeine Nausea And Vomiting  . Baclofen Itching and Rash  . Contrast Media [Iodinated Diagnostic Agents] Itching and Rash  . Erythromycin Rash  . Iodides Rash    Povidone iodine; betadine  . Latex Itching and Rash  . Nickel Rash and Other (See Comments)    OTHER REACTION  . Soap Itching and Rash  . Sulfa Antibiotics Rash  . Tape Rash    (silk tape only) skin break down  . Tolterodine Rash  . Vancomycin Rash      Procedures/Studies: Ct Head Wo Contrast  Result Date: 10/31/2015 CLINICAL DATA:  Acute onset of right-sided weakness and headache. Blurred vision and fever. Initial encounter. EXAM: CT HEAD  WITHOUT CONTRAST TECHNIQUE: Contiguous axial images were obtained from the base of the skull through the vertex without intravenous contrast. COMPARISON:  None. FINDINGS: Brain: No evidence of acute infarction, hemorrhage, hydrocephalus, extra-axial collection or mass lesion/mass effect. A large chronic infarct is noted at the right occipital lobe. Additional chronic infarcts are seen at the high parietal lobes bilaterally, with associated encephalomalacia. Prominence of the ventricles and sulci reflects mild cortical volume loss. The brainstem and fourth ventricle are within normal limits. The basal ganglia are unremarkable in appearance. No mass effect or midline shift is seen. Vascular: No hyperdense vessel or unexpected calcification. Skull: There is no evidence of fracture; visualized osseous structures are unremarkable in appearance. Sinuses/Orbits: The orbits are within normal limits. The paranasal sinuses and mastoid air cells are well-aerated. Other: No significant soft tissue abnormalities are seen. IMPRESSION: 1. No acute intracranial pathology seen on CT. 2. Large chronic infarct at the right occipital lobe. Additional chronic infarct at the high parietal lobes bilaterally, with associated encephalomalacia. 3.  Mild cortical volume loss noted. Electronically Signed   By: Roanna Raider M.D.   On: 10/31/2015 23:44   Dg Chest Port 1 View  Result Date: 10/31/2015 CLINICAL DATA:  Acute onset of fever and cough.  Initial encounter. EXAM: PORTABLE CHEST 1 VIEW COMPARISON:  None. FINDINGS: The lungs are well-aerated. Minimal bibasilar atelectasis is noted. There is no evidence of pleural effusion or pneumothorax. The cardiomediastinal silhouette is borderline enlarged. No acute osseous abnormalities are seen. IMPRESSION: Minimal bibasilar atelectasis noted. Lungs otherwise clear. Borderline cardiomegaly. Electronically Signed   By: Roanna Raider M.D.   On: 10/31/2015 22:31      Subjective: Feels better, no overnight issues  Discharge Exam: Vitals:   11/04/15 2118 11/05/15 0645  BP: (!) 149/68 139/66  Pulse: 85 81  Resp: 18 17  Temp: 98 F (36.7 C) 97.9 F (36.6 C)   Vitals:   11/04/15 0500 11/04/15 1433 11/04/15 2118 11/05/15 0645  BP: (!) 169/79 138/64 (!) 149/68 139/66  Pulse: 84 91 85 81  Resp: 17 17 18 17   Temp: 98.9 F (37.2 C) 97.5 F (36.4 C) 98 F (36.7 C) 97.9 F (36.6 C)  TempSrc: Oral Oral Oral   SpO2: 96% 93% 92% 93%  Weight:      Height:         Gen: not in distress HEENT:  moist mucosa, supple neck Chest: clear b/l, no added sounds CVS: N S1&S2, no murmurs,  GI: soft, NT, ND Musculoskeletal: warm, no edema CNS: Alert and oriented, left hemiparesis    The results of significant diagnostics from this hospitalization (including imaging, microbiology, ancillary and laboratory) are listed below for reference.     Microbiology: Recent Results (from the past 240 hour(s))  Blood Culture (routine x 2)     Status: None (Preliminary result)   Collection Time: 10/31/15  9:10 PM  Result Value Ref Range Status   Specimen Description BLOOD RIGHT HAND  Final   Special Requests BOTTLES DRAWN AEROBIC AND ANAEROBIC 5CC  Final   Culture NO GROWTH 4 DAYS  Final   Report Status  PENDING  Incomplete  Urine culture     Status: Abnormal   Collection Time: 10/31/15  9:14 PM  Result Value Ref Range Status   Specimen Description URINE, RANDOM  Final   Special Requests NONE  Final   Culture >=100,000 COLONIES/mL ESCHERICHIA COLI (A)  Final   Report Status 11/03/2015 FINAL  Final   Organism ID, Bacteria  ESCHERICHIA COLI (A)  Final      Susceptibility   Escherichia coli - MIC*    AMPICILLIN >=32 RESISTANT Resistant     CEFAZOLIN 8 SENSITIVE Sensitive     CEFTRIAXONE <=1 SENSITIVE Sensitive     CIPROFLOXACIN 1 SENSITIVE Sensitive     GENTAMICIN <=1 SENSITIVE Sensitive     IMIPENEM <=0.25 SENSITIVE Sensitive     NITROFURANTOIN <=16 SENSITIVE Sensitive     TRIMETH/SULFA >=320 RESISTANT Resistant     AMPICILLIN/SULBACTAM >=32 RESISTANT Resistant     PIP/TAZO 8 SENSITIVE Sensitive     Extended ESBL NEGATIVE Sensitive     * >=100,000 COLONIES/mL ESCHERICHIA COLI  Blood Culture (routine x 2)     Status: None (Preliminary result)   Collection Time: 10/31/15 10:06 PM  Result Value Ref Range Status   Specimen Description BLOOD RIGHT ANTECUBITAL  Final   Special Requests BOTTLES DRAWN AEROBIC AND ANAEROBIC 5CC  Final   Culture NO GROWTH 4 DAYS  Final   Report Status PENDING  Incomplete     Labs: BNP (last 3 results) No results for input(s): BNP in the last 8760 hours. Basic Metabolic Panel:  Recent Labs Lab 10/31/15 2110 11/01/15 0518  NA 136 138  K 4.2 3.7  CL 106 109  CO2 25 21*  GLUCOSE 159* 115*  BUN 21* 20  CREATININE 1.19* 1.07*  CALCIUM 8.7* 8.2*   Liver Function Tests:  Recent Labs Lab 10/31/15 2110  AST 14*  ALT 17  ALKPHOS 70  BILITOT 0.7  PROT 6.1*  ALBUMIN 3.0*   No results for input(s): LIPASE, AMYLASE in the last 168 hours. No results for input(s): AMMONIA in the last 168 hours. CBC:  Recent Labs Lab 10/31/15 2110 11/01/15 0518  WBC 11.5* 9.5  NEUTROABS 8.9*  --   HGB 12.2 11.3*  HCT 37.6 35.9*  MCV 96.7 98.1  PLT 165 137*    Cardiac Enzymes: No results for input(s): CKTOTAL, CKMB, CKMBINDEX, TROPONINI in the last 168 hours. BNP: Invalid input(s): POCBNP CBG:  Recent Labs Lab 10/31/15 2105  GLUCAP 158*   D-Dimer No results for input(s): DDIMER in the last 72 hours. Hgb A1c No results for input(s): HGBA1C in the last 72 hours. Lipid Profile No results for input(s): CHOL, HDL, LDLCALC, TRIG, CHOLHDL, LDLDIRECT in the last 72 hours. Thyroid function studies No results for input(s): TSH, T4TOTAL, T3FREE, THYROIDAB in the last 72 hours.  Invalid input(s): FREET3 Anemia work up No results for input(s): VITAMINB12, FOLATE, FERRITIN, TIBC, IRON, RETICCTPCT in the last 72 hours. Urinalysis    Component Value Date/Time   COLORURINE YELLOW 10/31/2015 2114   APPEARANCEUR TURBID (A) 10/31/2015 2114   APPEARANCEUR Cloudy 08/18/2011 1018   LABSPEC 1.021 10/31/2015 2114   LABSPEC 1.017 08/18/2011 1018   PHURINE 7.0 10/31/2015 2114   GLUCOSEU NEGATIVE 10/31/2015 2114   GLUCOSEU Negative 08/18/2011 1018   HGBUR MODERATE (A) 10/31/2015 2114   BILIRUBINUR NEGATIVE 10/31/2015 2114   BILIRUBINUR Negative 08/18/2011 1018   KETONESUR 15 (A) 10/31/2015 2114   PROTEINUR 30 (A) 10/31/2015 2114   NITRITE POSITIVE (A) 10/31/2015 2114   LEUKOCYTESUR LARGE (A) 10/31/2015 2114   LEUKOCYTESUR 3+ 08/18/2011 1018   Sepsis Labs Invalid input(s): PROCALCITONIN,  WBC,  LACTICIDVEN Microbiology Recent Results (from the past 240 hour(s))  Blood Culture (routine x 2)     Status: None (Preliminary result)   Collection Time: 10/31/15  9:10 PM  Result Value Ref Range Status   Specimen Description BLOOD RIGHT  HAND  Final   Special Requests BOTTLES DRAWN AEROBIC AND ANAEROBIC 5CC  Final   Culture NO GROWTH 4 DAYS  Final   Report Status PENDING  Incomplete  Urine culture     Status: Abnormal   Collection Time: 10/31/15  9:14 PM  Result Value Ref Range Status   Specimen Description URINE, RANDOM  Final   Special Requests  NONE  Final   Culture >=100,000 COLONIES/mL ESCHERICHIA COLI (A)  Final   Report Status 11/03/2015 FINAL  Final   Organism ID, Bacteria ESCHERICHIA COLI (A)  Final      Susceptibility   Escherichia coli - MIC*    AMPICILLIN >=32 RESISTANT Resistant     CEFAZOLIN 8 SENSITIVE Sensitive     CEFTRIAXONE <=1 SENSITIVE Sensitive     CIPROFLOXACIN 1 SENSITIVE Sensitive     GENTAMICIN <=1 SENSITIVE Sensitive     IMIPENEM <=0.25 SENSITIVE Sensitive     NITROFURANTOIN <=16 SENSITIVE Sensitive     TRIMETH/SULFA >=320 RESISTANT Resistant     AMPICILLIN/SULBACTAM >=32 RESISTANT Resistant     PIP/TAZO 8 SENSITIVE Sensitive     Extended ESBL NEGATIVE Sensitive     * >=100,000 COLONIES/mL ESCHERICHIA COLI  Blood Culture (routine x 2)     Status: None (Preliminary result)   Collection Time: 10/31/15 10:06 PM  Result Value Ref Range Status   Specimen Description BLOOD RIGHT ANTECUBITAL  Final   Special Requests BOTTLES DRAWN AEROBIC AND ANAEROBIC 5CC  Final   Culture NO GROWTH 4 DAYS  Final   Report Status PENDING  Incomplete     Time coordinating discharge: <30 minutes  SIGNED:   Richarda OverlieABROL,Bern Fare, MD  [facility] 11/05/2015, 9:06 AM Pager   If 7PM-7AM, please contact night-coverage [email or web address] Password [email or web address]

## 2015-11-05 NOTE — Clinical Social Work Note (Signed)
PASARR continues to be under review.  Placement pending receipt of PASARR.  Tammy PennaGina Ruslan Mccabe, MSW, LCSW  [phone]  Licensed Clinical Social Worker

## 2015-11-06 MED ORDER — CEPHALEXIN 500 MG PO CAPS
500.0000 mg | ORAL_CAPSULE | Freq: Two times a day (BID) | ORAL | Status: DC
  Administered 2015-11-06 – 2015-11-07 (×3): 500 mg via ORAL
  Filled 2015-11-06 (×3): qty 1

## 2015-11-06 NOTE — Progress Notes (Signed)
Physical Therapy Treatment Patient Details Name: Woodroe ModeCindy S Sones MRN: [id number] DOB: 07-22-1950 Today's Date: 11/06/2015    History of Present Illness 65 y.o. female admitted with UTI. PMH: HTN, CVA with Lt hemiparesis, diabetes, CHF, cervical fusion.     PT Comments    Pt with decreased sitting balance with R lean.  Con't to recommend SNF.  Follow Up Recommendations  SNF;Supervision - Intermittent     Equipment Recommendations  None recommended by PT    Recommendations for Other Services       Precautions / Restrictions Precautions Precautions: Fall Required Braces or Orthoses: Other Brace/Splint Other Brace/Splint: Lt AFO Restrictions Weight Bearing Restrictions: No    Mobility  Bed Mobility Overal bed mobility: Needs Assistance Bed Mobility: Supine to Sit     Supine to sit: Mod assist;+2 for physical assistance     General bed mobility comments: Pt A with R LE, but needed increased A with trunk and to get hips forward  Transfers Overall transfer level: Needs assistance Equipment used: None Transfers: Stand Pivot Transfers   Stand pivot transfers: Mod assist;+2 physical assistance       General transfer comment: cues for technique and to reach for arm rest of recliner while transferring to the R. Flexed posture.  Ambulation/Gait                 Stairs            Wheelchair Mobility    Modified Rankin (Stroke Patients Only)       Balance Overall balance assessment: Needs assistance Sitting-balance support: Feet supported;Single extremity supported Sitting balance-Leahy Scale: Poor       Standing balance-Leahy Scale: Poor                      Cognition Arousal/Alertness: Awake/alert Behavior During Therapy: Flat affect Overall Cognitive Status: Within Functional Limits for tasks assessed                      Exercises      General Comments General comments (skin integrity, edema, etc.): NT educated on  how to A pt back to bed.      Pertinent Vitals/Pain Pain Assessment: No/denies pain    Home Living                      Prior Function            PT Goals (current goals can now be found in the care plan section) Acute Rehab PT Goals Patient Stated Goal: go to nursing facility after the hospital PT Goal Formulation: With patient Time For Goal Achievement: 11/17/15 Potential to Achieve Goals: Fair Progress towards PT goals: Progressing toward goals    Frequency    Min 2X/week      PT Plan Current plan remains appropriate    Co-evaluation             End of Session Equipment Utilized During Treatment: Gait belt Activity Tolerance: Patient limited by fatigue Patient left: in chair;with call bell/phone within reach;Other (comment) (with lunch)     Time: [id number] PT Time Calculation (min) (ACUTE ONLY): 20 min  Charges:  $Therapeutic Activity: 8-22 mins                    G Codes:      Bebe Moncure LUBECK 11/06/2015, 2:20 PM

## 2015-11-06 NOTE — Clinical Social Work Note (Addendum)
Information requested from [facility] for PASARR submitted. Awaiting review.  Charlynn CourtSarah Marleah Beever, CSW [phone]  12:24 pm [facility], Caren Griffinsoug Wilkinson, patient can discharge to their facility once PASSAR obtained.  Charlynn CourtSarah Ahren Pettinger, CSW [phone]  4:23 pm Received message from PASARR: Pt has an expired level 2 pasrr. Will pt require 30 days or less at NF? If so, then submit a note signed and dated by MD stating "pt will require 30 days or less at [facility] for services". 30-day note faxed.  Charlynn CourtSarah Cabe Lashley, CSW [phone]  4:55 pm CSW called [facility]. Office is closed. Notified RN.  Charlynn CourtSarah Alaysia Lightle, CSW [phone]

## 2015-11-07 MED ORDER — CEPHALEXIN 500 MG PO CAPS
500.0000 mg | ORAL_CAPSULE | Freq: Two times a day (BID) | ORAL | 0 refills | Status: AC
Start: 2015-11-07 — End: 2015-11-11

## 2015-11-07 NOTE — H&P (Signed)
  [facility] Transportation here to get patient

## 2015-11-07 NOTE — Discharge Summary (Signed)
Physician Discharge Summary  Tammy Shepard RUE:[id number] DOB: 10/29/1950 DOA: 10/31/2015  PCP: Laurence Aly, MD  Admit date: 10/31/2015 Discharge date: 11/07/2015  Admitted From: Home Disposition:  SNF  Recommendations for Outpatient Follow-up:  Follow up with M.D. at SNF in 1 week.  Continue antibiotics through  11/09/2015   Home Health: None Equipment/Devices: As per therapy at SNF  Discharge Condition: stable CODE STATUS: Full code Diet recommendation: Heart healthy   Discharge Diagnoses:  Principal Problem:   Recurrent UTI  Active Problems:   Fever  Escherichia coli UTI (lower urinary tract infection)   H/O: CVA (cerebrovascular accident)   Hypotension   Essential hypertension, benign  Brief narrative/  history of present illness Please refer to admission H&P for details, in brief, 68 F with recurrent UTIs, HTN, CVA with residual left-sided weakness presented with with fever, dysuria, weakness, suprapubic pain and dizziness. Found to have UTI with low blood pressure.  Hospital course Escherichia coli urinary tract infection Placed on empiric rocephin. Cultures growing Escherichia coli sensitive to cephalosporins. Escherichia coli sensitive to following medications as displayed in the table. Resistant to everything else . Continue oral Keflex  Until 10/1.  Escherichia coli sensitivity as follows CEFAZOLIN 8 SENSITIVE  Sensitive    CEFTRIAXONE <=1 SENSITIVE "><=1 SENSITIVE  Sensitive   CIPROFLOXACIN 1 SENSITIVE  Sensitive   Extended ESBL NEGATIVE  Sensitive   GENTAMICIN <=1 SENSITIVE "><=1 SENSITIVE  Sensitive   IMIPENEM <=0.25 SENSITIVE "><=0.25 SENS... Sensitive   NITROFURANTOIN <=16 SENSITIVE "><=16 SENSIT... Sensitive   PIP/TAZO 8 SENSITIVE  Sensitive         HTN (hypertension) Blood pressure meds held on admission due to soft BP. Now improved and IV hydration. Medications resumed.  CVA with residual left sided weakness ASA and  statin   hypothyroidism Synthroid   Diabetes mellitus Continue metformin.  Generalized weakness Seen by PT and recommended SNF.  Code Status : full code  Family Communication  :  discussed with daughter on the phone on 9/25.  Disposition Plan  : SNF    Consults  :  none  Procedures  : none   Discharge Instructions  Discharge Instructions    Diet - low sodium heart healthy    Complete by:  As directed    Diet - low sodium heart healthy    Complete by:  As directed    Increase activity slowly    Complete by:  As directed    Increase activity slowly    Complete by:  As directed        Medication List    TAKE these medications   albuterol 108 (90 Base) MCG/ACT inhaler Commonly known as:  PROVENTIL HFA;VENTOLIN HFA Inhale 2 puffs into the lungs daily as needed for shortness of breath.   albuterol (2.5 MG/3ML) 0.083% nebulizer solution Commonly known as:  PROVENTIL Take 2.5 mg by nebulization every 8 (eight) hours as needed for shortness of breath.   amLODipine 10 MG tablet Commonly known as:  NORVASC Take 10 mg by mouth daily.   aspirin EC 81 MG tablet Take 81 mg by mouth daily.   atorvastatin 40 MG tablet Commonly known as:  LIPITOR Take 40 mg by mouth every morning.   cephALEXin 500 MG capsule Commonly known as:  KEFLEX Take 1 capsule (500 mg total) by mouth every 12 (twelve) hours.   clonazePAM 0.5 MG tablet Commonly known as:  KLONOPIN Take 1 tablet (0.5 mg total) by mouth 2 (two) times daily as  needed for anxiety.   cloNIDine 0.3 MG tablet Commonly known as:  CATAPRES Take 0.3 mg by mouth 2 (two) times daily.   cyclobenzaprine 5 MG tablet Commonly known as:  FLEXERIL Take 5 mg by mouth 2 (two) times daily as needed for muscle spasms.   FLUoxetine 20 MG capsule Commonly known as:  PROZAC Take 60 mg by mouth daily.   gabapentin 300 MG capsule Commonly known as:  NEURONTIN Take 600 mg by mouth 2 (two) times daily.    levothyroxine 75 MCG tablet Commonly known as:  SYNTHROID, LEVOTHROID Take 75 mcg by mouth daily before breakfast.   lidocaine 5 % ointment Commonly known as:  XYLOCAINE Apply 1 application topically 3 (three) times daily as needed for pain.   losartan 50 MG tablet Commonly known as:  COZAAR Take 50 mg by mouth 2 (two) times daily.   meloxicam 15 MG tablet Commonly known as:  MOBIC Take 15 mg by mouth daily.   metFORMIN 500 MG tablet Commonly known as:  GLUCOPHAGE Take 500 mg by mouth 2 (two) times daily.   omeprazole 20 MG capsule Commonly known as:  PRILOSEC Take 20 mg by mouth daily.   oxybutynin 5 MG tablet Commonly known as:  DITROPAN Take 10 mg by mouth 2 (two) times daily.   oxyCODONE 5 MG immediate release tablet Commonly known as:  Oxy IR/ROXICODONE Take 1 tablet (5 mg total) by mouth daily as needed.   oxyCODONE 10 mg 12 hr tablet Commonly known as:  OXYCONTIN Take 1 tablet (10 mg total) by mouth every 12 (twelve) hours.   polyethylene glycol packet Commonly known as:  MIRALAX / GLYCOLAX Take 17 g by mouth daily.   RA VITAMIN B-12 TR 1000 MCG Tbcr Generic drug:  Cyanocobalamin Take 1,000 mcg by mouth daily.   senna 8.6 MG tablet Commonly known as:  SENOKOT Take 2 tablets by mouth 2 (two) times daily as needed for constipation.   traZODone 100 MG tablet Commonly known as:  DESYREL Take 200 mg by mouth at bedtime.   trimethoprim 100 MG tablet Commonly known as:  TRIMPEX Take 100 mg by mouth daily.       Contact information for follow-up providers    MD at SNF in 1 week .        Laurence Aly, MD. Schedule an appointment as soon as possible for a visit in 2 day(s).   Specialty:  Family Medicine Why:  Hospital follow-up Contact information: [address] [facility] [address] [phone]            Contact information for after-discharge care    Destination    [facility] .    Specialty:  Skilled Nursing Facility Contact information: [address] [place] [address] [phone]                 Allergies  Allergen Reactions  . Amoxicillin Rash    Tolerating rocephin 10/29/15  . Ciprofloxacin Anaphylaxis  . Clindamycin Hcl Swelling, Rash and Other (See Comments)    Other Reaction: BAD RASH AND SWELLING, SKIN BR Severe rash and swelling. Skin break down.  Marland Kitchen Penicillins Anaphylaxis    Tolerating rocephin 11/01/15  . Codeine Nausea And Vomiting  . Baclofen Itching and Rash  . Contrast Media [Iodinated Diagnostic Agents] Itching and Rash  . Erythromycin Rash  . Iodides Rash    Povidone iodine; betadine  . Latex Itching and Rash  .  Nickel Rash and Other (See Comments)    OTHER REACTION  . Soap Itching and Rash  . Sulfa Antibiotics Rash  . Tape Rash    (silk tape only) skin break down  . Tolterodine Rash  . Vancomycin Rash      Procedures/Studies: Ct Head Wo Contrast  Result Date: 10/31/2015 CLINICAL DATA:  Acute onset of right-sided weakness and headache. Blurred vision and fever. Initial encounter. EXAM: CT HEAD WITHOUT CONTRAST TECHNIQUE: Contiguous axial images were obtained from the base of the skull through the vertex without intravenous contrast. COMPARISON:  None. FINDINGS: Brain: No evidence of acute infarction, hemorrhage, hydrocephalus, extra-axial collection or mass lesion/mass effect. A large chronic infarct is noted at the right occipital lobe. Additional chronic infarcts are seen at the high parietal lobes bilaterally, with associated encephalomalacia. Prominence of the ventricles and sulci reflects mild cortical volume loss. The brainstem and fourth ventricle are within normal limits. The basal ganglia are unremarkable in appearance. No mass effect or midline shift is seen. Vascular: No hyperdense vessel or unexpected calcification. Skull: There is no evidence of fracture; visualized osseous structures are  unremarkable in appearance. Sinuses/Orbits: The orbits are within normal limits. The paranasal sinuses and mastoid air cells are well-aerated. Other: No significant soft tissue abnormalities are seen. IMPRESSION: 1. No acute intracranial pathology seen on CT. 2. Large chronic infarct at the right occipital lobe. Additional chronic infarct at the high parietal lobes bilaterally, with associated encephalomalacia. 3. Mild cortical volume loss noted. Electronically Signed   By: Roanna RaiderJeffery  Chang M.D.   On: 10/31/2015 23:44   Dg Chest Port 1 View  Result Date: 10/31/2015 CLINICAL DATA:  Acute onset of fever and cough.  Initial encounter. EXAM: PORTABLE CHEST 1 VIEW COMPARISON:  None. FINDINGS: The lungs are well-aerated. Minimal bibasilar atelectasis is noted. There is no evidence of pleural effusion or pneumothorax. The cardiomediastinal silhouette is borderline enlarged. No acute osseous abnormalities are seen. IMPRESSION: Minimal bibasilar atelectasis noted. Lungs otherwise clear. Borderline cardiomegaly. Electronically Signed   By: Roanna RaiderJeffery  Chang M.D.   On: 10/31/2015 22:31      Subjective: Feels better, no overnight issues  Discharge Exam: Vitals:   11/06/15 2005 11/07/15 0700  BP: (!) 164/69 (!) 142/72  Pulse: 89 86  Resp: 16 16  Temp: 99.3 F (37.4 C) 98.6 F (37 C)   Vitals:   11/06/15 0651 11/06/15 1446 11/06/15 2005 11/07/15 0700  BP: (!) 145/80 139/69 (!) 164/69 (!) 142/72  Pulse: 83 89 89 86  Resp: 16 16 16 16   Temp: 97.6 F (36.4 C) 97.8 F (36.6 C) 99.3 F (37.4 C) 98.6 F (37 C)  TempSrc: Oral Oral Oral Oral  SpO2: 95% 94% 99% 96%  Weight:      Height:         Gen: not in distress HEENT:  moist mucosa, supple neck Chest: clear b/l, no added sounds CVS: N S1&S2, no murmurs,  GI: soft, NT, ND Musculoskeletal: warm, no edema CNS: Alert and oriented, left hemiparesis    The results of significant diagnostics from this hospitalization (including imaging,  microbiology, ancillary and laboratory) are listed below for reference.     Microbiology: Recent Results (from the past 240 hour(s))  Blood Culture (routine x 2)     Status: None   Collection Time: 10/31/15  9:10 PM  Result Value Ref Range Status   Specimen Description BLOOD RIGHT HAND  Final   Special Requests BOTTLES DRAWN AEROBIC AND ANAEROBIC 5CC  Final  Culture NO GROWTH 5 DAYS  Final   Report Status 11/05/2015 FINAL  Final  Urine culture     Status: Abnormal   Collection Time: 10/31/15  9:14 PM  Result Value Ref Range Status   Specimen Description URINE, RANDOM  Final   Special Requests NONE  Final   Culture >=100,000 COLONIES/mL ESCHERICHIA COLI (A)  Final   Report Status 11/03/2015 FINAL  Final   Organism ID, Bacteria ESCHERICHIA COLI (A)  Final      Susceptibility   Escherichia coli - MIC*    AMPICILLIN >=32 RESISTANT Resistant     CEFAZOLIN 8 SENSITIVE Sensitive     CEFTRIAXONE <=1 SENSITIVE Sensitive     CIPROFLOXACIN 1 SENSITIVE Sensitive     GENTAMICIN <=1 SENSITIVE Sensitive     IMIPENEM <=0.25 SENSITIVE Sensitive     NITROFURANTOIN <=16 SENSITIVE Sensitive     TRIMETH/SULFA >=320 RESISTANT Resistant     AMPICILLIN/SULBACTAM >=32 RESISTANT Resistant     PIP/TAZO 8 SENSITIVE Sensitive     Extended ESBL NEGATIVE Sensitive     * >=100,000 COLONIES/mL ESCHERICHIA COLI  Blood Culture (routine x 2)     Status: None   Collection Time: 10/31/15 10:06 PM  Result Value Ref Range Status   Specimen Description BLOOD RIGHT ANTECUBITAL  Final   Special Requests BOTTLES DRAWN AEROBIC AND ANAEROBIC 5CC  Final   Culture NO GROWTH 5 DAYS  Final   Report Status 11/05/2015 FINAL  Final     Labs: BNP (last 3 results) No results for input(s): BNP in the last 8760 hours. Basic Metabolic Panel:  Recent Labs Lab 10/31/15 2110 11/01/15 0518  NA 136 138  K 4.2 3.7  CL 106 109  CO2 25 21*  GLUCOSE 159* 115*  BUN 21* 20  CREATININE 1.19* 1.07*  CALCIUM 8.7* 8.2*   Liver  Function Tests:  Recent Labs Lab 10/31/15 2110  AST 14*  ALT 17  ALKPHOS 70  BILITOT 0.7  PROT 6.1*  ALBUMIN 3.0*   No results for input(s): LIPASE, AMYLASE in the last 168 hours. No results for input(s): AMMONIA in the last 168 hours. CBC:  Recent Labs Lab 10/31/15 2110 11/01/15 0518  WBC 11.5* 9.5  NEUTROABS 8.9*  --   HGB 12.2 11.3*  HCT 37.6 35.9*  MCV 96.7 98.1  PLT 165 137*   Cardiac Enzymes: No results for input(s): CKTOTAL, CKMB, CKMBINDEX, TROPONINI in the last 168 hours. BNP: Invalid input(s): POCBNP CBG:  Recent Labs Lab 10/31/15 2105  GLUCAP 158*   D-Dimer No results for input(s): DDIMER in the last 72 hours. Hgb A1c No results for input(s): HGBA1C in the last 72 hours. Lipid Profile No results for input(s): CHOL, HDL, LDLCALC, TRIG, CHOLHDL, LDLDIRECT in the last 72 hours. Thyroid function studies No results for input(s): TSH, T4TOTAL, T3FREE, THYROIDAB in the last 72 hours.  Invalid input(s): FREET3 Anemia work up No results for input(s): VITAMINB12, FOLATE, FERRITIN, TIBC, IRON, RETICCTPCT in the last 72 hours. Urinalysis    Component Value Date/Time   COLORURINE YELLOW 10/31/2015 2114   APPEARANCEUR TURBID (A) 10/31/2015 2114   APPEARANCEUR Cloudy 08/18/2011 1018   LABSPEC 1.021 10/31/2015 2114   LABSPEC 1.017 08/18/2011 1018   PHURINE 7.0 10/31/2015 2114   GLUCOSEU NEGATIVE 10/31/2015 2114   GLUCOSEU Negative 08/18/2011 1018   HGBUR MODERATE (A) 10/31/2015 2114   BILIRUBINUR NEGATIVE 10/31/2015 2114   BILIRUBINUR Negative 08/18/2011 1018   KETONESUR 15 (A) 10/31/2015 2114   PROTEINUR 30 (A)  10/31/2015 2114   NITRITE POSITIVE (A) 10/31/2015 2114   LEUKOCYTESUR LARGE (A) 10/31/2015 2114   LEUKOCYTESUR 3+ 08/18/2011 1018   Sepsis Labs Invalid input(s): PROCALCITONIN,  WBC,  LACTICIDVEN Microbiology Recent Results (from the past 240 hour(s))  Blood Culture (routine x 2)     Status: None   Collection Time: 10/31/15  9:10 PM   Result Value Ref Range Status   Specimen Description BLOOD RIGHT HAND  Final   Special Requests BOTTLES DRAWN AEROBIC AND ANAEROBIC 5CC  Final   Culture NO GROWTH 5 DAYS  Final   Report Status 11/05/2015 FINAL  Final  Urine culture     Status: Abnormal   Collection Time: 10/31/15  9:14 PM  Result Value Ref Range Status   Specimen Description URINE, RANDOM  Final   Special Requests NONE  Final   Culture >=100,000 COLONIES/mL ESCHERICHIA COLI (A)  Final   Report Status 11/03/2015 FINAL  Final   Organism ID, Bacteria ESCHERICHIA COLI (A)  Final      Susceptibility   Escherichia coli - MIC*    AMPICILLIN >=32 RESISTANT Resistant     CEFAZOLIN 8 SENSITIVE Sensitive     CEFTRIAXONE <=1 SENSITIVE Sensitive     CIPROFLOXACIN 1 SENSITIVE Sensitive     GENTAMICIN <=1 SENSITIVE Sensitive     IMIPENEM <=0.25 SENSITIVE Sensitive     NITROFURANTOIN <=16 SENSITIVE Sensitive     TRIMETH/SULFA >=320 RESISTANT Resistant     AMPICILLIN/SULBACTAM >=32 RESISTANT Resistant     PIP/TAZO 8 SENSITIVE Sensitive     Extended ESBL NEGATIVE Sensitive     * >=100,000 COLONIES/mL ESCHERICHIA COLI  Blood Culture (routine x 2)     Status: None   Collection Time: 10/31/15 10:06 PM  Result Value Ref Range Status   Specimen Description BLOOD RIGHT ANTECUBITAL  Final   Special Requests BOTTLES DRAWN AEROBIC AND ANAEROBIC 5CC  Final   Culture NO GROWTH 5 DAYS  Final   Report Status 11/05/2015 FINAL  Final     Time coordinating discharge: <30 minutes  SIGNED:   Richarda Overlie, MD  [facility] 11/07/2015, 10:24 AM Pager   If 7PM-7AM, please contact night-coverage [email or web address] Password [email or web address]

## 2015-11-07 NOTE — Clinical Social Work Placement (Signed)
   CLINICAL SOCIAL WORK PLACEMENT  NOTE  Date:  11/07/2015  Patient Details  Name: Tammy Shepard MRN: [id number] Date of Birth: 1951/01/23  Clinical Social Work is seeking post-discharge placement for this patient at the Skilled  Nursing Facility level of care (*CSW will initial, date and re-position this form in  chart as items are completed):  Yes   Patient/family provided with [facility] Social Work Department's list of facilities offering this level of care within the geographic area requested by the patient (or if unable, by the patient's family).  Yes   Patient/family informed of their freedom to choose among providers that offer the needed level of care, that participate in Medicare, Medicaid or managed care program needed by the patient, have an available bed and are willing to accept the patient.  Yes   Patient/family informed of 's ownership interest in [facility] and [facility], as well as of the fact that they are under no obligation to receive care at these facilities.  PASRR submitted to EDS on 11/04/15     PASRR number received on 11/07/15     Existing PASRR number confirmed on       FL2 transmitted to all facilities in geographic area requested by pt/family on 11/04/15     FL2 transmitted to all facilities within larger geographic area on       Patient informed that his/her managed care company has contracts with or will negotiate with certain facilities, including the following:        Yes   Patient/family informed of bed offers received.  Patient chooses bed at [facility]     Physician recommends and patient chooses bed at      Patient to [facility] on  .  Patient to be transferred to facility by [facility]     Patient family notified on 11/07/15 of transfer.  Name of family member notified:  patient is alert and oriented and is  agreeable to update daughter     PHYSICIAN Please prepare priority discharge summary, including medications     Additional Comment:    _______________________________________________ Rondel BatonIngle, Sagan Maselli C, LCSW 11/07/2015, 10:11 AM

## 2015-11-07 NOTE — Progress Notes (Signed)
Briefly visited the patient No complaints, has been stable since 9/26,  Awaiting PASARR  number Will update discharge summary when bed available

## 2015-11-07 NOTE — Clinical Social Work Note (Signed)
Patient will discharge today per MD order. [facility] RN to call report prior to transportation to:[phone] Transportation: [facility]- scheduled  CSW sent discharge summary to SNF for review.  Patient, RN, MD, RNCM all updated.  PASARR received   Vickii PennaGina Delailah Spieth, MSW, LCSW  [phone]  Licensed Clinical Social Worker

## 2016-02-25 ENCOUNTER — Emergency Department: Payer: Medicare Other

## 2016-02-25 ENCOUNTER — Encounter: Payer: Self-pay | Admitting: Emergency Medicine

## 2016-02-25 ENCOUNTER — Inpatient Hospital Stay
Admission: EM | Admit: 2016-02-25 | Discharge: 2016-02-28 | DRG: 871 | Disposition: A | Discharge status: Skilled Nursing Facility | Payer: Medicare Other | Attending: Internal Medicine | Admitting: Internal Medicine

## 2016-02-25 DIAGNOSIS — Z7982 Long term (current) use of aspirin: Secondary | ICD-10-CM

## 2016-02-25 DIAGNOSIS — D638 Anemia in other chronic diseases classified elsewhere: Secondary | ICD-10-CM | POA: Diagnosis present

## 2016-02-25 DIAGNOSIS — E785 Hyperlipidemia, unspecified: Secondary | ICD-10-CM | POA: Diagnosis present

## 2016-02-25 DIAGNOSIS — E039 Hypothyroidism, unspecified: Secondary | ICD-10-CM | POA: Diagnosis present

## 2016-02-25 DIAGNOSIS — F39 Unspecified mood [affective] disorder: Secondary | ICD-10-CM | POA: Diagnosis present

## 2016-02-25 DIAGNOSIS — Z7984 Long term (current) use of oral hypoglycemic drugs: Secondary | ICD-10-CM

## 2016-02-25 DIAGNOSIS — J189 Pneumonia, unspecified organism: Secondary | ICD-10-CM

## 2016-02-25 DIAGNOSIS — L89151 Pressure ulcer of sacral region, stage 1: Secondary | ICD-10-CM | POA: Diagnosis present

## 2016-02-25 DIAGNOSIS — R0902 Hypoxemia: Secondary | ICD-10-CM | POA: Diagnosis present

## 2016-02-25 DIAGNOSIS — E119 Type 2 diabetes mellitus without complications: Secondary | ICD-10-CM | POA: Diagnosis present

## 2016-02-25 DIAGNOSIS — N179 Acute kidney failure, unspecified: Secondary | ICD-10-CM | POA: Diagnosis present

## 2016-02-25 DIAGNOSIS — N390 Urinary tract infection, site not specified: Secondary | ICD-10-CM | POA: Diagnosis present

## 2016-02-25 DIAGNOSIS — J45909 Unspecified asthma, uncomplicated: Secondary | ICD-10-CM | POA: Diagnosis present

## 2016-02-25 DIAGNOSIS — G40909 Epilepsy, unspecified, not intractable, without status epilepticus: Secondary | ICD-10-CM | POA: Diagnosis present

## 2016-02-25 DIAGNOSIS — A419 Sepsis, unspecified organism: Principal | ICD-10-CM | POA: Diagnosis present

## 2016-02-25 DIAGNOSIS — Z791 Long term (current) use of non-steroidal anti-inflammatories (NSAID): Secondary | ICD-10-CM | POA: Diagnosis not present

## 2016-02-25 DIAGNOSIS — Y95 Nosocomial condition: Secondary | ICD-10-CM | POA: Diagnosis present

## 2016-02-25 DIAGNOSIS — I69354 Hemiplegia and hemiparesis following cerebral infarction affecting left non-dominant side: Secondary | ICD-10-CM | POA: Diagnosis not present

## 2016-02-25 DIAGNOSIS — I10 Essential (primary) hypertension: Secondary | ICD-10-CM | POA: Diagnosis present

## 2016-02-25 DIAGNOSIS — Z79899 Other long term (current) drug therapy: Secondary | ICD-10-CM | POA: Diagnosis not present

## 2016-02-25 DIAGNOSIS — Z87891 Personal history of nicotine dependence: Secondary | ICD-10-CM

## 2016-02-25 DIAGNOSIS — Z880 Allergy status to penicillin: Secondary | ICD-10-CM

## 2016-02-25 DIAGNOSIS — R509 Fever, unspecified: Secondary | ICD-10-CM | POA: Diagnosis not present

## 2016-02-25 HISTORY — DX: Unspecified convulsions: R56.9

## 2016-02-25 LAB — COMPREHENSIVE METABOLIC PANEL
ALBUMIN: 3.6 g/dL (ref 3.5–5.0)
ALT: 13 U/L — ABNORMAL LOW (ref 14–54)
ANION GAP: 10 (ref 5–15)
AST: 21 U/L (ref 15–41)
Alkaline Phosphatase: 42 U/L (ref 38–126)
BILIRUBIN TOTAL: 0.8 mg/dL (ref 0.3–1.2)
BUN: 22 mg/dL — ABNORMAL HIGH (ref 6–20)
CHLORIDE: 109 mmol/L (ref 101–111)
CO2: 25 mmol/L (ref 22–32)
Calcium: 9.3 mg/dL (ref 8.9–10.3)
Creatinine, Ser: 1.01 mg/dL — ABNORMAL HIGH (ref 0.44–1.00)
GFR calc Af Amer: >60 mL/min (ref >60)
GFR calc non Af Amer: 58 mL/min — ABNORMAL LOW (ref >60)
GLUCOSE: 186 mg/dL — AB (ref 65–99)
POTASSIUM: 4.1 mmol/L (ref 3.5–5.1)
SODIUM: 144 mmol/L (ref 135–145)
TOTAL PROTEIN: 6.8 g/dL (ref 6.5–8.1)

## 2016-02-25 LAB — URINALYSIS, ROUTINE W REFLEX MICROSCOPIC
Bilirubin Urine: NEGATIVE
Bilirubin Urine: NEGATIVE
GLUCOSE, UA: NEGATIVE mg/dL
Glucose, UA: NEGATIVE mg/dL
Ketones, ur: 5 mg/dL — AB
Ketones, ur: 5 mg/dL — AB
NITRITE: NEGATIVE
Nitrite: NEGATIVE
PROTEIN: 100 mg/dL — AB
Protein, ur: 100 mg/dL — AB
SPECIFIC GRAVITY, URINE: 1.021 (ref 1.005–1.030)
SPECIFIC GRAVITY, URINE: 1.023 (ref 1.005–1.030)
pH: 5 (ref 5.0–8.0)
pH: 5 (ref 5.0–8.0)

## 2016-02-25 LAB — CBC WITH DIFFERENTIAL/PLATELET
BASOS ABS: 0 10*3/uL (ref 0–0.1)
Basophils Relative: 0 %
Eosinophils Absolute: 0 10*3/uL (ref 0–0.7)
Eosinophils Relative: 0 %
HCT: 40.9 % (ref 35.0–47.0)
Hemoglobin: 13.7 g/dL (ref 12.0–16.0)
LYMPHS ABS: 0.3 10*3/uL — AB (ref 1.0–3.6)
Lymphocytes Relative: 10 %
MCH: 33.5 pg (ref 26.0–34.0)
MCHC: 33.6 g/dL (ref 32.0–36.0)
MCV: 99.8 fL (ref 80.0–100.0)
MONO ABS: 0.5 10*3/uL (ref 0.2–0.9)
Monocytes Relative: 14 %
NEUTROS ABS: 2.5 10*3/uL (ref 1.4–6.5)
Neutrophils Relative %: 76 %
Platelets: 228 10*3/uL (ref 150–440)
RBC: 4.1 MIL/uL (ref 3.80–5.20)
RDW: 16.9 % — AB (ref 11.5–14.5)
WBC: 3.4 10*3/uL — ABNORMAL LOW (ref 3.6–11.0)

## 2016-02-25 LAB — LACTIC ACID, PLASMA
LACTIC ACID, VENOUS: 4 mmol/L — AB (ref 0.5–1.9)
LACTIC ACID, VENOUS: 2.7 mmol/L — AB (ref 0.5–1.9)

## 2016-02-25 LAB — GLUCOSE, CAPILLARY: Glucose-Capillary: 119 mg/dL — ABNORMAL HIGH (ref 65–99)

## 2016-02-25 LAB — TSH: TSH: 5.223 u[IU]/mL — ABNORMAL HIGH (ref 0.350–4.500)

## 2016-02-25 LAB — MRSA PCR SCREENING: MRSA by PCR: NEGATIVE

## 2016-02-25 LAB — INFLUENZA PANEL BY PCR (TYPE A & B)
INFLBPCR: NEGATIVE
Influenza A By PCR: NEGATIVE

## 2016-02-25 MED ORDER — POLYETHYLENE GLYCOL 3350 17 G PO PACK
17.0000 g | PACK | Freq: Every day | ORAL | Status: DC
  Administered 2016-02-25 – 2016-02-27 (×3): 17 g via ORAL
  Filled 2016-02-25 (×4): qty 1

## 2016-02-25 MED ORDER — ATORVASTATIN CALCIUM 20 MG PO TABS
40.0000 mg | ORAL_TABLET | ORAL | Status: DC
  Administered 2016-02-25 – 2016-02-28 (×4): 40 mg via ORAL
  Filled 2016-02-25 (×4): qty 2

## 2016-02-25 MED ORDER — FLUOXETINE HCL 20 MG PO CAPS
40.0000 mg | ORAL_CAPSULE | Freq: Every day | ORAL | Status: DC
  Administered 2016-02-26 – 2016-02-28 (×3): 40 mg via ORAL
  Filled 2016-02-25 (×5): qty 2

## 2016-02-25 MED ORDER — AZTREONAM 2 G IJ SOLR
2.0000 g | Freq: Three times a day (TID) | INTRAMUSCULAR | Status: DC
  Administered 2016-02-25 – 2016-02-27 (×7): 2 g via INTRAVENOUS
  Filled 2016-02-25 (×8): qty 2

## 2016-02-25 MED ORDER — SODIUM CHLORIDE 0.9 % IV BOLUS (SEPSIS)
1000.0000 mL | Freq: Once | INTRAVENOUS | Status: AC
  Administered 2016-02-25: 1000 mL via INTRAVENOUS

## 2016-02-25 MED ORDER — AMLODIPINE BESYLATE 10 MG PO TABS: 10.0000 mg | ORAL_TABLET | Freq: Every day | ORAL | Status: DC

## 2016-02-25 MED ORDER — ACETAMINOPHEN 325 MG PO TABS: 650.0000 mg | ORAL_TABLET | Freq: Four times a day (QID) | ORAL | Status: DC | PRN

## 2016-02-25 MED ORDER — AZTREONAM 2 G IJ SOLR
2.0000 g | Freq: Once | INTRAMUSCULAR | Status: AC
  Administered 2016-02-25: 2 g via INTRAVENOUS
  Filled 2016-02-25: qty 2

## 2016-02-25 MED ORDER — SODIUM CHLORIDE 0.9% FLUSH
3.0000 mL | Freq: Two times a day (BID) | INTRAVENOUS | Status: DC
  Administered 2016-02-26 – 2016-02-28 (×2): 3 mL via INTRAVENOUS

## 2016-02-25 MED ORDER — INSULIN ASPART 100 UNIT/ML ~~LOC~~ SOLN
0.0000 [IU] | Freq: Three times a day (TID) | SUBCUTANEOUS | Status: DC
  Administered 2016-02-26: 1 [IU] via SUBCUTANEOUS
  Administered 2016-02-26: 2 [IU] via SUBCUTANEOUS
  Filled 2016-02-25: qty 2

## 2016-02-25 MED ORDER — DIVALPROEX SODIUM 125 MG PO CSDR
750.0000 mg | DELAYED_RELEASE_CAPSULE | Freq: Every day | ORAL | Status: DC
  Administered 2016-02-26 – 2016-02-27 (×2): 750 mg via ORAL
  Filled 2016-02-25: qty 6

## 2016-02-25 MED ORDER — GABAPENTIN 300 MG PO CAPS: 600.0000 mg | ORAL_CAPSULE | Freq: Two times a day (BID) | ORAL | Status: DC

## 2016-02-25 MED ORDER — DIVALPROEX SODIUM 125 MG PO CSDR
500.0000 mg | DELAYED_RELEASE_CAPSULE | Freq: Every day | ORAL | Status: DC
  Administered 2016-02-26 – 2016-02-28 (×2): 500 mg via ORAL
  Filled 2016-02-25 (×5): qty 4

## 2016-02-25 MED ORDER — CLONIDINE HCL 0.1 MG PO TABS: 0.3000 mg | ORAL_TABLET | Freq: Two times a day (BID) | ORAL | Status: DC

## 2016-02-25 MED ORDER — VANCOMYCIN HCL IN DEXTROSE 750-5 MG/150ML-% IV SOLN
750.0000 mg | Freq: Two times a day (BID) | INTRAVENOUS | Status: DC
  Administered 2016-02-25 – 2016-02-26 (×2): 750 mg via INTRAVENOUS
  Filled 2016-02-25 (×4): qty 150

## 2016-02-25 MED ORDER — TRAZODONE HCL 100 MG PO TABS
200.0000 mg | ORAL_TABLET | Freq: Every day | ORAL | Status: DC
Start: 2016-02-25 — End: 2016-02-25

## 2016-02-25 MED ORDER — VANCOMYCIN HCL 10 G IV SOLR
1500.0000 mg | Freq: Once | INTRAVENOUS | Status: AC
  Administered 2016-02-25: 1500 mg via INTRAVENOUS
  Filled 2016-02-25: qty 1500

## 2016-02-25 MED ORDER — TRIMETHOPRIM 100 MG PO TABS
100.0000 mg | ORAL_TABLET | Freq: Every day | ORAL | Status: DC
  Administered 2016-02-26: 100 mg via ORAL
  Filled 2016-02-25 (×2): qty 1

## 2016-02-25 MED ORDER — CYCLOBENZAPRINE HCL 10 MG PO TABS: 5.0000 mg | ORAL_TABLET | Freq: Two times a day (BID) | ORAL | Status: DC | PRN

## 2016-02-25 MED ORDER — ARIPIPRAZOLE 2 MG PO TABS
10.0000 mg | ORAL_TABLET | Freq: Every day | ORAL | Status: DC
  Administered 2016-02-25 – 2016-02-28 (×4): 10 mg via ORAL
  Filled 2016-02-25 (×5): qty 5

## 2016-02-25 MED ORDER — DIVALPROEX SODIUM 125 MG PO CSDR
250.0000 mg | DELAYED_RELEASE_CAPSULE | Freq: Two times a day (BID) | ORAL | Status: DC
  Administered 2016-02-25: 250 mg via ORAL
  Filled 2016-02-25: qty 2

## 2016-02-25 MED ORDER — LOSARTAN POTASSIUM 50 MG PO TABS
50.0000 mg | ORAL_TABLET | Freq: Two times a day (BID) | ORAL | Status: DC
  Administered 2016-02-25 – 2016-02-28 (×7): 50 mg via ORAL
  Filled 2016-02-25 (×7): qty 1

## 2016-02-25 MED ORDER — VITAMIN B-12 1000 MCG PO TABS
1000.0000 ug | ORAL_TABLET | Freq: Every day | ORAL | Status: DC
  Administered 2016-02-25 – 2016-02-28 (×4): 1000 ug via ORAL
  Filled 2016-02-25 (×4): qty 1

## 2016-02-25 MED ORDER — METFORMIN HCL 500 MG PO TABS
500.0000 mg | ORAL_TABLET | Freq: Two times a day (BID) | ORAL | Status: DC
  Administered 2016-02-25 – 2016-02-26 (×2): 500 mg via ORAL
  Filled 2016-02-25 (×2): qty 1

## 2016-02-25 MED ORDER — ONDANSETRON HCL 4 MG PO TABS: 4.0000 mg | ORAL_TABLET | Freq: Four times a day (QID) | ORAL | Status: DC | PRN

## 2016-02-25 MED ORDER — ALBUTEROL SULFATE (2.5 MG/3ML) 0.083% IN NEBU: 2.5000 mg | INHALATION_SOLUTION | Freq: Three times a day (TID) | RESPIRATORY_TRACT | Status: DC | PRN

## 2016-02-25 MED ORDER — SODIUM CHLORIDE 0.9 % IV BOLUS (SEPSIS)
1000.0000 mL | Freq: Once | INTRAVENOUS | Status: AC
Start: 2016-02-25 — End: 2016-02-25
  Administered 2016-02-25: 1000 mL via INTRAVENOUS

## 2016-02-25 MED ORDER — ACETAMINOPHEN 650 MG RE SUPP: 650.0000 mg | Freq: Four times a day (QID) | RECTAL | Status: DC | PRN

## 2016-02-25 MED ORDER — ASPIRIN EC 81 MG PO TBEC
81.0000 mg | DELAYED_RELEASE_TABLET | Freq: Every day | ORAL | Status: DC
  Administered 2016-02-25 – 2016-02-26 (×2): 81 mg via ORAL
  Filled 2016-02-25 (×2): qty 1

## 2016-02-25 MED ORDER — ONDANSETRON HCL 4 MG/2ML IJ SOLN: 4.0000 mg | Freq: Four times a day (QID) | INTRAMUSCULAR | Status: DC | PRN

## 2016-02-25 MED ORDER — DIVALPROEX SODIUM 125 MG PO CSDR
500.0000 mg | DELAYED_RELEASE_CAPSULE | Freq: Two times a day (BID) | ORAL | Status: DC
  Administered 2016-02-25 (×2): 500 mg via ORAL
  Filled 2016-02-25 (×3): qty 4

## 2016-02-25 MED ORDER — LEVOTHYROXINE SODIUM 75 MCG PO TABS
75.0000 ug | ORAL_TABLET | Freq: Every day | ORAL | Status: DC
  Administered 2016-02-25 – 2016-02-28 (×4): 75 ug via ORAL
  Filled 2016-02-25 (×4): qty 1

## 2016-02-25 MED ORDER — OXYCODONE HCL 5 MG PO TABS
5.0000 mg | ORAL_TABLET | Freq: Every day | ORAL | Status: DC | PRN
  Administered 2016-02-27 (×2): 5 mg via ORAL
  Filled 2016-02-25 (×2): qty 1

## 2016-02-25 MED ORDER — INSULIN ASPART 100 UNIT/ML ~~LOC~~ SOLN
0.0000 [IU] | Freq: Every day | SUBCUTANEOUS | Status: DC
Start: 2016-02-25 — End: 2016-02-28

## 2016-02-25 MED ORDER — OXYBUTYNIN CHLORIDE 5 MG PO TABS
10.0000 mg | ORAL_TABLET | Freq: Two times a day (BID) | ORAL | Status: DC
  Administered 2016-02-25 – 2016-02-28 (×7): 10 mg via ORAL
  Filled 2016-02-25 (×7): qty 2

## 2016-02-25 MED ORDER — SODIUM CHLORIDE 0.9 % IV SOLN
INTRAVENOUS | Status: DC
  Administered 2016-02-25 – 2016-02-28 (×7): via INTRAVENOUS

## 2016-02-25 MED ORDER — CLONAZEPAM 0.5 MG PO TABS
0.5000 mg | ORAL_TABLET | Freq: Two times a day (BID) | ORAL | Status: DC | PRN
  Administered 2016-02-25 – 2016-02-27 (×4): 0.5 mg via ORAL
  Filled 2016-02-25 (×4): qty 1

## 2016-02-25 MED ORDER — ENOXAPARIN SODIUM 40 MG/0.4ML ~~LOC~~ SOLN
40.0000 mg | SUBCUTANEOUS | Status: DC
  Administered 2016-02-25 – 2016-02-27 (×3): 40 mg via SUBCUTANEOUS
  Filled 2016-02-25 (×3): qty 0.4

## 2016-02-25 MED ORDER — MELOXICAM 7.5 MG PO TABS: 15.0000 mg | ORAL_TABLET | Freq: Every day | ORAL | Status: DC | PRN

## 2016-02-25 MED ORDER — PANTOPRAZOLE SODIUM 40 MG PO TBEC
40.0000 mg | DELAYED_RELEASE_TABLET | Freq: Every day | ORAL | Status: DC
  Administered 2016-02-25 – 2016-02-28 (×4): 40 mg via ORAL
  Filled 2016-02-25 (×4): qty 1

## 2016-02-25 MED ORDER — ACETAMINOPHEN 650 MG RE SUPP
975.0000 mg | Freq: Once | RECTAL | Status: AC
  Administered 2016-02-25: 975 mg via RECTAL
  Filled 2016-02-25: qty 1

## 2016-02-25 MED ORDER — OXYCODONE HCL ER 10 MG PO T12A
10.0000 mg | EXTENDED_RELEASE_TABLET | Freq: Two times a day (BID) | ORAL | Status: DC
  Administered 2016-02-25 – 2016-02-28 (×7): 10 mg via ORAL
  Filled 2016-02-25 (×7): qty 1

## 2016-02-25 MED ORDER — SENNA 8.6 MG PO TABS: 2.0000 | ORAL_TABLET | Freq: Two times a day (BID) | ORAL | Status: DC | PRN

## 2016-02-25 NOTE — H&P (Signed)
Tammy Shepard is an 66 y.o. female.   Chief Complaint: Fever HPI: The patient with past medical history significant for stroke with left hemiparesis presents emergency department with a fever. Upon arrival maximum temperature was 102.26F. The patient had reportedly been in her usual state of health earlier this evening but seemed to rapidly decline. She was found to be very tachypneic and chest x-ray revealed right lung base opacity suspicious for pneumonia. There was also some report of seizure activity at the nursing home but the patient's daughter reports that no mention of seizure activity was made to her nor has she seen any prior to arrival. Lactic acid levels were also elevated on initial laboratory evaluation which found to the emergency department staff to start broad-spectrum antibiotics as well as IV hydration prior to calling the hospitalist service for admission.  Past Medical History:  Diagnosis Date  . Asthma   . CVA (cerebrovascular accident due to intracerebral hemorrhage) ([place])   . Diabetes mellitus without complication ([address])   . Hyperlipemia   . Hypertension   . Seizures ([place])   . Stroke [facility])     Past Surgical History:  Procedure Laterality Date  . BRAIN SURGERY    . CERVICAL FUSION      No family history on file. Social History:  reports that she has quit smoking. She has never used smokeless tobacco. She reports that she does not drink alcohol or use drugs.  Allergies:  Allergies  Allergen Reactions  . Amoxicillin Rash    Tolerating rocephin 10/29/15  . Ciprofloxacin Anaphylaxis  . Clindamycin Hcl Swelling, Rash and Other (See Comments)    Other Reaction: BAD RASH AND SWELLING, SKIN BR Severe rash and swelling. Skin break down.  Marland Kitchen Penicillins Anaphylaxis    Tolerating rocephin 11/01/15  . Codeine Nausea And Vomiting  . Baclofen Itching and Rash  . Contrast Media [Iodinated Diagnostic Agents] Itching and Rash  . Erythromycin Rash  . Iodides Rash     Povidone iodine; betadine  . Latex Itching and Rash  . Nickel Rash and Other (See Comments)    OTHER REACTION  . Soap Itching and Rash  . Sulfa Antibiotics Rash  . Tape Rash    (silk tape only) skin break down  . Tolterodine Rash  . Vancomycin Rash    Prior to Admission medications   Medication Sig Start Date End Date Taking? Authorizing Provider  albuterol (PROVENTIL HFA;VENTOLIN HFA) 108 (90 Base) MCG/ACT inhaler Inhale 2 puffs into the lungs daily as needed for shortness of breath. 11/04/12  Yes Historical Provider, MD  albuterol (PROVENTIL) (2.5 MG/3ML) 0.083% nebulizer solution Take 2.5 mg by nebulization every 8 (eight) hours as needed for shortness of breath. 11/09/13  Yes Historical Provider, MD  amLODipine (NORVASC) 10 MG tablet Take 10 mg by mouth daily. 07/06/13  Yes Historical Provider, MD  ARIPiprazole (ABILIFY) 10 MG tablet Take 10 mg by mouth daily.   Yes Historical Provider, MD  aspirin EC 81 MG tablet Take 81 mg by mouth daily.   Yes Historical Provider, MD  atorvastatin (LIPITOR) 40 MG tablet Take 40 mg by mouth every morning.   Yes Historical Provider, MD  cloNIDine (CATAPRES) 0.3 MG tablet Take 0.3 mg by mouth 2 (two) times daily.   Yes Historical Provider, MD  Cyanocobalamin (RA VITAMIN B-12 TR) 1000 MCG TBCR Take 1,000 mcg by mouth daily.   Yes Historical Provider, MD  cyclobenzaprine (FLEXERIL) 5 MG tablet Take 5 mg by mouth 2 (two) times  daily as needed for muscle spasms. 10/02/15  Yes Historical Provider, MD  divalproex (DEPAKOTE SPRINKLE) 125 MG capsule Take 500 mg by mouth 2 (two) times daily.   Yes Historical Provider, MD  divalproex (DEPAKOTE SPRINKLE) 125 MG capsule Take 250 mg by mouth 2 (two) times daily.   Yes Historical Provider, MD  FLUoxetine (PROZAC) 40 MG capsule Take 40 mg by mouth daily.   Yes Historical Provider, MD  gabapentin (NEURONTIN) 300 MG capsule Take 600 mg by mouth 2 (two) times daily. 10/27/15  Yes Historical Provider, MD  levothyroxine  (SYNTHROID, LEVOTHROID) 75 MCG tablet Take 75 mcg by mouth daily before breakfast. 09/03/13  Yes Historical Provider, MD  lidocaine (XYLOCAINE) 5 % ointment Apply 1 application topically 3 (three) times daily as needed for pain. 10/02/15 10/01/16 Yes Historical Provider, MD  losartan (COZAAR) 50 MG tablet Take 50 mg by mouth 2 (two) times daily. 10/27/15  Yes Historical Provider, MD  meloxicam (MOBIC) 15 MG tablet Take 15 mg by mouth daily. 08/28/15  Yes Historical Provider, MD  metFORMIN (GLUCOPHAGE) 500 MG tablet Take 500 mg by mouth 2 (two) times daily. 10/16/15  Yes Historical Provider, MD  omeprazole (PRILOSEC) 20 MG capsule Take 20 mg by mouth daily. 09/18/15  Yes Historical Provider, MD  ondansetron (ZOFRAN) 4 MG tablet Take 4 mg by mouth every 6 (six) hours as needed for nausea or vomiting.   Yes Historical Provider, MD  oxybutynin (DITROPAN) 5 MG tablet Take 10 mg by mouth 2 (two) times daily. 01/10/14  Yes Historical Provider, MD  oxyCODONE (OXY IR/ROXICODONE) 5 MG immediate release tablet Take 1 tablet (5 mg total) by mouth daily as needed. Patient taking differently: Take 5 mg by mouth daily as needed for moderate pain or severe pain.  11/04/15  Yes Nishant Dhungel, MD  oxyCODONE (OXYCONTIN) 10 mg 12 hr tablet Take 1 tablet (10 mg total) by mouth every 12 (twelve) hours. 11/04/15  Yes Nishant Dhungel, MD  polyethylene glycol (MIRALAX / GLYCOLAX) packet Take 17 g by mouth daily.   Yes Historical Provider, MD  senna (SENOKOT) 8.6 MG tablet Take 2 tablets by mouth 2 (two) times daily as needed for constipation. 06/05/13  Yes Historical Provider, MD  traZODone (DESYREL) 100 MG tablet Take 200 mg by mouth at bedtime. 08/27/14  Yes Historical Provider, MD  trimethoprim (TRIMPEX) 100 MG tablet Take 100 mg by mouth daily. 08/01/15  Yes Historical Provider, MD  clonazePAM (KLONOPIN) 0.5 MG tablet Take 1 tablet (0.5 mg total) by mouth 2 (two) times daily as needed for anxiety. 11/04/15   Nishant Dhungel, MD      Results for orders placed or performed during the hospital encounter of 02/25/16 (from the past 48 hour(s))  Comprehensive metabolic panel     Status: Abnormal   Collection Time: 02/25/16  4:24 AM  Result Value Ref Range   Sodium 144 135 - 145 mmol/L   Potassium 4.1 3.5 - 5.1 mmol/L   Chloride 109 101 - 111 mmol/L   CO2 25 22 - 32 mmol/L   Glucose, Bld 186 (H) 65 - 99 mg/dL   BUN 22 (H) 6 - 20 mg/dL   Creatinine, Ser 1.01 (H) 0.44 - 1.00 mg/dL   Calcium 9.3 8.9 - 10.3 mg/dL   Total Protein 6.8 6.5 - 8.1 g/dL   Albumin 3.6 3.5 - 5.0 g/dL   AST 21 15 - 41 U/L   ALT 13 (L) 14 - 54 U/L   Alkaline Phosphatase 42 38 -  126 U/L   Total Bilirubin 0.8 0.3 - 1.2 mg/dL   GFR calc non Af Amer 58 (L) >60 mL/min   GFR calc Af Amer >60 >60 mL/min    Comment: (NOTE) The eGFR has been calculated using the CKD EPI equation. This calculation has not been validated in all clinical situations. eGFR's persistently <60 mL/min signify possible Chronic Kidney Disease.    Anion gap 10 5 - 15  CBC WITH DIFFERENTIAL     Status: Abnormal   Collection Time: 02/25/16  4:24 AM  Result Value Ref Range   WBC 3.4 (L) 3.6 - 11.0 K/uL   RBC 4.10 3.80 - 5.20 MIL/uL   Hemoglobin 13.7 12.0 - 16.0 g/dL   HCT 40.9 35.0 - 47.0 %   MCV 99.8 80.0 - 100.0 fL   MCH 33.5 26.0 - 34.0 pg   MCHC 33.6 32.0 - 36.0 g/dL   RDW 16.9 (H) 11.5 - 14.5 %   Platelets 228 150 - 440 K/uL   Neutrophils Relative % 76 %   Neutro Abs 2.5 1.4 - 6.5 K/uL   Lymphocytes Relative 10 %   Lymphs Abs 0.3 (L) 1.0 - 3.6 K/uL   Monocytes Relative 14 %   Monocytes Absolute 0.5 0.2 - 0.9 K/uL   Eosinophils Relative 0 %   Eosinophils Absolute 0.0 0 - 0.7 K/uL   Basophils Relative 0 %   Basophils Absolute 0.0 0 - 0.1 K/uL  Lactic acid, plasma     Status: Abnormal   Collection Time: 02/25/16  4:24 AM  Result Value Ref Range   Lactic Acid, Venous 4.0 (HH) 0.5 - 1.9 mmol/L    Comment: CRITICAL RESULT CALLED TO, READ BACK BY AND VERIFIED  WITH  KALEY WALKER AT 0520 02/25/16 SDR   Urinalysis, Routine w reflex microscopic     Status: Abnormal   Collection Time: 02/25/16  4:24 AM  Result Value Ref Range   Color, Urine AMBER (A) YELLOW    Comment: BIOCHEMICALS MAY BE AFFECTED BY COLOR   APPearance TURBID (A) CLEAR   Specific Gravity, Urine 1.021 1.005 - 1.030   pH 5.0 5.0 - 8.0   Glucose, UA NEGATIVE NEGATIVE mg/dL   Hgb urine dipstick MODERATE (A) NEGATIVE   Bilirubin Urine NEGATIVE NEGATIVE   Ketones, ur 5 (A) NEGATIVE mg/dL   Protein, ur 100 (A) NEGATIVE mg/dL   Nitrite NEGATIVE NEGATIVE   Leukocytes, UA MODERATE (A) NEGATIVE   RBC / HPF 6-30 0 - 5 RBC/hpf   WBC, UA TOO NUMEROUS TO COUNT 0 - 5 WBC/hpf   Bacteria, UA MANY (A) NONE SEEN   Squamous Epithelial / LPF 0-5 (A) NONE SEEN   WBC Clumps PRESENT    Mucous PRESENT   Urinalysis, Routine w reflex microscopic     Status: Abnormal   Collection Time: 02/25/16  4:45 AM  Result Value Ref Range   Color, Urine AMBER (A) YELLOW    Comment: BIOCHEMICALS MAY BE AFFECTED BY COLOR   APPearance TURBID (A) CLEAR   Specific Gravity, Urine 1.023 1.005 - 1.030   pH 5.0 5.0 - 8.0   Glucose, UA NEGATIVE NEGATIVE mg/dL   Hgb urine dipstick MODERATE (A) NEGATIVE   Bilirubin Urine NEGATIVE NEGATIVE   Ketones, ur 5 (A) NEGATIVE mg/dL   Protein, ur 100 (A) NEGATIVE mg/dL   Nitrite NEGATIVE NEGATIVE   Leukocytes, UA MODERATE (A) NEGATIVE   RBC / HPF 6-30 0 - 5 RBC/hpf   WBC, UA TOO NUMEROUS TO COUNT  0 - 5 WBC/hpf   Bacteria, UA MANY (A) NONE SEEN   Squamous Epithelial / LPF 0-5 (A) NONE SEEN   Mucous PRESENT   Influenza panel by PCR (type A & B)     Status: None   Collection Time: 02/25/16  4:45 AM  Result Value Ref Range   Influenza A By PCR NEGATIVE NEGATIVE   Influenza B By PCR NEGATIVE NEGATIVE    Comment: (NOTE) The Xpert Xpress Flu assay is intended as an aid in the diagnosis of  influenza and should not be used as a sole basis for treatment.  This  assay is FDA  approved for nasopharyngeal swab specimens only. Nasal  washings and aspirates are unacceptable for Xpert Xpress Flu testing.    Ct Head Wo Contrast  Result Date: 02/25/2016 CLINICAL DATA:  Decreased level of consciousness.  Lethargy. EXAM: CT HEAD WITHOUT CONTRAST TECHNIQUE: Contiguous axial images were obtained from the base of the skull through the vertex without intravenous contrast. COMPARISON:  Most recent head CT 10/31/2015 FINDINGS: Brain: No intracranial hemorrhage. Chronic encephalomalacia involving both frontal lobes, right parietal lobe, and right occipital lobe, unchanged. Moderate chronic small vessel ischemia elsewhere. Stable atrophy. No subdural or extra-axial fluid collection. Vascular: No hyperdense vessel or unexpected calcification. Skull: Normal. Negative for fracture or focal lesion. Sinuses/Orbits: Opacification of lower left mastoid air cells is chronic. No paranasal sinus inflammation. Other: None. IMPRESSION: 1.  No acute intracranial abnormality. 2. Stable multifocal encephalomalacia, atrophy, and chronic small vessel ischemia. Electronically Signed   By: Jeb Levering M.D.   On: 02/25/2016 05:26   Dg Chest Portable 1 View  Result Date: 02/25/2016 CLINICAL DATA:  Weakness and shortness of breath. EXAM: PORTABLE CHEST 1 VIEW COMPARISON:  10/31/2015 FINDINGS: Patchy opacity at the right lung base suspicious for pneumonia. Minimal left basilar atelectasis. Unchanged heart size and mediastinal contours. There is atherosclerosis of the thoracic aorta. No pulmonary edema or pleural fluid. No pneumothorax. Unchanged osseous structures. Surgical hardware in the lower cervical spine is partially included. IMPRESSION: Patchy right lung base opacity suspicious for pneumonia. Thoracic aortic atherosclerosis. Electronically Signed   By: Jeb Levering M.D.   On: 02/25/2016 05:27    Review of Systems  Unable to perform ROS: Acuity of condition    Blood pressure 92/60, pulse 81,  temperature (!) 102.8 F (39.3 C), temperature source Rectal, resp. rate (!) 26, height _0  (1.6 m), weight 85.3 kg (188 lb), SpO2 94 %. Physical Exam  Vitals reviewed. Constitutional: She is oriented to person, place, and time. She appears well-developed and well-nourished. No distress.  HENT:  Head: Normocephalic and atraumatic.  Mouth/Throat: Oropharynx is clear and moist.  Eyes: Conjunctivae and EOM are normal. Pupils are equal, round, and reactive to light. No scleral icterus.  Neck: Normal range of motion. Neck supple. No JVD present. No tracheal deviation present. No thyromegaly present.  Cardiovascular: Normal rate, regular rhythm and normal heart sounds.  Exam reveals no gallop and no friction rub.   No murmur heard. Respiratory: Effort normal. She has decreased breath sounds in the right lower field and the left lower field. She has rhonchi in the right middle field.  GI: Soft. Bowel sounds are normal. She exhibits no distension. There is no tenderness.  Genitourinary:  Genitourinary Comments: Deferred  Musculoskeletal: Normal range of motion. She exhibits edema (Trace).  Contracture of left upper extremity  Lymphadenopathy:    She has no cervical adenopathy.  Neurological: She is alert and oriented to  person, place, and time. No cranial nerve deficit. She exhibits normal muscle tone.  Skin: Skin is warm and dry. No rash noted. No erythema. There is pallor.  Psychiatric:  Difficult to assess mental status as the patient is very somnolent     Assessment/Plan This is a 66 year old female admitted for sepsis. 1. Sepsis: The patient meets criteria via fever, tachypnea and leukopenia. Lactic acid is elevated. Aggressively hydrate with intravenous fluid. She has multiple allergies, thus we will cover gram negatives with aztreonam and MRSA with vancomycin (unclear reaction; observe for allergy response). Her pressure is low but she appears to be hemodynamically stable. Follow blood  cultures for growth and sensitivities. 2. Pneumonia: Healthcare associated. Supplemental oxygen as needed. Antibiotics as above 3. Essential hypertension: I have continued the patient's ARB, clonidine and amlodipine. However, if her pressure remains low or decreases we may need to hold her regular medications. 4. Diabetes mellitus type 2: Hold oral hypoglycemic agents. Sliding sole insulin while hospitalized 5. Hyperlipidemia: Continue statin therapy 6. Acute kidney injury: Mild; avoid nephrotoxic agents including NSAIDs (hold meloxicam) 7. Seizure disorder: Continue Depakote 8. IPO thyroidism: Continue Synthroid. Check TSH 9. Mood disorder: Continue Abilify and Prozac 10. DVT prophylaxis: Lovenox 11. GI prophylaxis: None The patient is a full code. Time spent on admission orders and patient care approximately 45 minutes  Harrie Foreman, MD 02/25/2016, 7:32 AM

## 2016-02-25 NOTE — Progress Notes (Signed)
[facility] - [place] at [facility]   PATIENT NAME: Tammy Shepard    MR#:  [id number]  DATE OF BIRTH:  1950/08/11  SUBJECTIVE:  CHIEF COMPLAINT:  Pt is altered has ch left sided weakness from old CVA  REVIEW OF SYSTEMS:  Ros - Unobtainable   DRUG ALLERGIES:   Allergies  Allergen Reactions  . Amoxicillin Rash    Tolerating rocephin 10/29/15  . Ciprofloxacin Anaphylaxis  . Clindamycin Hcl Swelling, Rash and Other (See Comments)    Other Reaction: BAD RASH AND SWELLING, SKIN BR Severe rash and swelling. Skin break down.  Marland Kitchen Penicillins Anaphylaxis    Tolerating rocephin 11/01/15  . Codeine Nausea And Vomiting  . Baclofen Itching and Rash  . Contrast Media [Iodinated Diagnostic Agents] Itching and Rash  . Erythromycin Rash  . Iodides Rash    Povidone iodine; betadine  . Latex Itching and Rash  . Nickel Rash and Other (See Comments)    OTHER REACTION  . Soap Itching and Rash  . Sulfa Antibiotics Rash  . Tape Rash    (silk tape only) skin break down  . Tolterodine Rash  . Vancomycin Rash    VITALS:  Blood pressure (!) 97/48, pulse 78, temperature 98.5 F (36.9 C), temperature source Oral, resp. rate 17, height 5\' 3"  (1.6 m), weight 85.3 kg (188 lb), SpO2 94 %.  PHYSICAL EXAMINATION:  GENERAL:  66 y.o.-year-old patient lying in the bed with no acute distress.  EYES: Pupils equal, round, reactive to light and accommodation. No scleral icterus.  HEENT: Head atraumatic, normocephalic. Oropharynx and nasopharynx clear.  NECK:  Supple, no jugular venous distention. No thyroid enlargement, no tenderness.  LUNGS: Diminished breath sounds bilaterally,with gurgly sounds no wheezing,  Positive rales,rhonchi/ crepitation. No use of accessory muscles of respiration.  CARDIOVASCULAR: S1, S2 normal. No murmurs, rubs, or gallops.  ABDOMEN: Soft, nontender, nondistended. Bowel sounds present. No organomegaly or mass.  EXTREMITIES: No pedal edema, cyanosis, or  clubbing.  NEUROLOGIC: pt with AMS ch left sided contractures. Gait not checked.  PSYCHIATRIC: The patient is delerious  SKIN: No obvious rash, lesion, or ulcer.    LABORATORY PANEL:   CBC  Recent Labs Lab 02/25/16 0424  WBC 3.4*  HGB 13.7  HCT 40.9  PLT 228   ------------------------------------------------------------------------------------------------------------------  Chemistries   Recent Labs Lab 02/25/16 0424  NA 144  K 4.1  CL 109  CO2 25  GLUCOSE 186*  BUN 22*  CREATININE 1.01*  CALCIUM 9.3  AST 21  ALT 13*  ALKPHOS 42  BILITOT 0.8   ------------------------------------------------------------------------------------------------------------------  Cardiac Enzymes No results for input(s): TROPONINI in the last 168 hours. ------------------------------------------------------------------------------------------------------------------  RADIOLOGY:  Ct Head Wo Contrast  Result Date: 02/25/2016 CLINICAL DATA:  Decreased level of consciousness.  Lethargy. EXAM: CT HEAD WITHOUT CONTRAST TECHNIQUE: Contiguous axial images were obtained from the base of the skull through the vertex without intravenous contrast. COMPARISON:  Most recent head CT 10/31/2015 FINDINGS: Brain: No intracranial hemorrhage. Chronic encephalomalacia involving both frontal lobes, right parietal lobe, and right occipital lobe, unchanged. Moderate chronic small vessel ischemia elsewhere. Stable atrophy. No subdural or extra-axial fluid collection. Vascular: No hyperdense vessel or unexpected calcification. Skull: Normal. Negative for fracture or focal lesion. Sinuses/Orbits: Opacification of lower left mastoid air cells is chronic. No paranasal sinus inflammation. Other: None. IMPRESSION: 1.  No acute intracranial abnormality. 2. Stable multifocal encephalomalacia, atrophy, and chronic small vessel ischemia. Electronically Signed   By: Rubye Oaks M.D.   On:  02/25/2016 05:26   Dg Chest  Portable 1 View  Result Date: 02/25/2016 CLINICAL DATA:  Weakness and shortness of breath. EXAM: PORTABLE CHEST 1 VIEW COMPARISON:  10/31/2015 FINDINGS: Patchy opacity at the right lung base suspicious for pneumonia. Minimal left basilar atelectasis. Unchanged heart size and mediastinal contours. There is atherosclerosis of the thoracic aorta. No pulmonary edema or pleural fluid. No pneumothorax. Unchanged osseous structures. Surgical hardware in the lower cervical spine is partially included. IMPRESSION: Patchy right lung base opacity suspicious for pneumonia. Thoracic aortic atherosclerosis. Electronically Signed   By: Rubye OaksMelanie  Ehinger M.D.   On: 02/25/2016 05:27    EKG:   Orders placed or performed during the hospital encounter of 02/25/16  . ED EKG 12-Lead  . ED EKG 12-Lead  . EKG 12-Lead  . EKG 12-Lead  . ED EKG 12-Lead  . ED EKG 12-Lead    ASSESSMENT AND PLAN:   This is a 66 year old female admitted for fever , dx with sepsis  1. Sepsis: The patient meets criteria via fever, tachypnea and leukopenia.  Lactic acid is  4.0 --2.7.  Aggressively hydrate with intravenous fluid.  continue  aztreonam and MRSA with vancomycin (unclear reaction; observe for allergy response).   Follow blood cultures for growth and sensitivities.  2. Pneumonia: Healthcare associated.  Supplemental oxygen as needed.  Iv Antibiotics with aztreonam and vanc ST evaluated pt as there is a concern for aspiration - recommends puree diet with nectar thick liquids  3. Essential hypertension:pt is hypotensive   Hold patient's ARB, clonidine and amlodipine  4. Diabetes mellitus type : Hold oral hypoglycemic agents. Sliding sole insulin while hospitalized  5. Hyperlipidemia: Continue statin therapy  6. Acute kidney injury: Mild; avoid nephrotoxic agents including NSAIDs (hold meloxicam)  7. Seizure disorder: Continue Depakote  8. Hypothyroidism: Continue Synthroid. Check TSH  9. Mood disorder: Continue  Abilify and Prozac when pt is awake and alert  H/o CVA with left sided weakness and contractures Hold trazodone and neurontin as pt is delerious  10. DVT prophylaxis: Lovenox 11. GI prophylaxis: None     All the records are reviewed and case discussed with Care Management/Social Workerr. Management plans discussed with the patient, family and they are in agreement.  CODE STATUS: FC  TOTAL TIME TAKING CARE OF THIS PATIENT: 36  minutes.   POSSIBLE D/C IN 2-3  DAYS, DEPENDING ON CLINICAL CONDITION.  Note: This dictation was prepared with Dragon dictation along with smaller phrase technology. Any transcriptional errors that result from this process are unintentional.   Ramonita LabGouru, Ita Fritzsche M.D on 02/25/2016 at 5:57 PM  Between 7am to 6pm - Pager - [phone] After 6pm go to [email or web address] - password [email or web address] [facility]  [facility]  Office  [phone]  CC: Primary care physician; Laurence AlyGAY, LAURA C, MD

## 2016-02-25 NOTE — ED Provider Notes (Signed)
[facility] Emergency Department Provider Note   ____________________________________________   First MD Initiated Contact with Patient 02/25/16 [phone]     (approximate)  I have reviewed the triage vital signs and the nursing notes.   HISTORY  Chief Complaint Fever; Shortness of Breath; and Seizures  History obtained from daughter  HPI Woodroe ModeCindy S Louissaint is a 66 y.o. female brought to th skilled nursing facility for fever, shortness of breath and decreased level of consciousness.Patient has a history of CVA with resulting left hemiplegia who developed fever and cough over the past day.daughter states she last saw the patient at dinnertime last evening and she was in her usual state of health.EMS reports fever and focal seizures from nursing staff. Per daughter, patient has not been experiencing chest pain, abdominal pain, nausea, vomiting, dysuria or diarrhea. Denies recent travel or trauma. Nothing makes her symptoms better or worse.   Past Medical History:  Diagnosis Date  . Asthma   . CVA (cerebrovascular accident due to intracerebral hemorrhage) (HCC)   . Diabetes mellitus without complication (HCC)   . Hyperlipemia   . Hypertension   . Seizures (HCC)   . Stroke [facility](HCC)     Patient Active Problem List   Diagnosis Date Noted  . H/O: CVA (cerebrovascular accident) 11/04/2015  . Hypotension 11/04/2015  . Essential hypertension, benign 11/04/2015  . Fever in adult   . Generalized weakness   . 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 Fever 10/31/2015  . UTI (lower urinary tract infection) 10/31/2015    Past Surgical History:  Procedure Laterality Date  . BRAIN SURGERY    . CERVICAL FUSION      Prior to Admission medications   Medication Sig Start Date End Date Taking? Authorizing Provider  albuterol (PROVENTIL HFA;VENTOLIN HFA) 108 (90 Base) MCG/ACT inhaler Inhale 2 puffs into the lungs daily as needed for shortness of breath. 11/04/12  Yes Historical  Provider, MD  albuterol (PROVENTIL) (2.5 MG/3ML) 0.083% nebulizer solution Take 2.5 mg by nebulization every 8 (eight) hours as needed for shortness of breath. 11/09/13  Yes Historical Provider, MD  amLODipine (NORVASC) 10 MG tablet Take 10 mg by mouth daily. 07/06/13  Yes Historical Provider, MD  ARIPiprazole (ABILIFY) 10 MG tablet Take 10 mg by mouth daily.   Yes Historical Provider, MD  aspirin EC 81 MG tablet Take 81 mg by mouth daily.   Yes Historical Provider, MD  atorvastatin (LIPITOR) 40 MG tablet Take 40 mg by mouth every morning.   Yes Historical Provider, MD  cloNIDine (CATAPRES) 0.3 MG tablet Take 0.3 mg by mouth 2 (two) times daily.   Yes Historical Provider, MD  Cyanocobalamin (RA VITAMIN B-12 TR) 1000 MCG TBCR Take 1,000 mcg by mouth daily.   Yes Historical Provider, MD  cyclobenzaprine (FLEXERIL) 5 MG tablet Take 5 mg by mouth 2 (two) times daily as needed for muscle spasms. 10/02/15  Yes Historical Provider, MD  divalproex (DEPAKOTE SPRINKLE) 125 MG capsule Take 500 mg by mouth 2 (two) times daily.   Yes Historical Provider, MD  divalproex (DEPAKOTE SPRINKLE) 125 MG capsule Take 250 mg by mouth 2 (two) times daily.   Yes Historical Provider, MD  FLUoxetine (PROZAC) 40 MG capsule Take 40 mg by mouth daily.   Yes Historical Provider, MD  gabapentin (NEURONTIN) 300 MG capsule Take 600 mg by mouth 2 (two) times daily. 10/27/15  Yes Historical Provider, MD  levothyroxine (SYNTHROID, LEVOTHROID) 75 MCG tablet Take 75 mcg by mouth daily before breakfast. 09/03/13  Yes Historical Provider, MD  lidocaine (XYLOCAINE) 5 % ointment Apply 1 application topically 3 (three) times daily as needed for pain. 10/02/15 10/01/16 Yes Historical Provider, MD  losartan (COZAAR) 50 MG tablet Take 50 mg by mouth 2 (two) times daily. 10/27/15  Yes Historical Provider, MD  meloxicam (MOBIC) 15 MG tablet Take 15 mg by mouth daily. 08/28/15  Yes Historical Provider, MD  metFORMIN (GLUCOPHAGE) 500 MG tablet Take 500 mg by  mouth 2 (two) times daily. 10/16/15  Yes Historical Provider, MD  omeprazole (PRILOSEC) 20 MG capsule Take 20 mg by mouth daily. 09/18/15  Yes Historical Provider, MD  ondansetron (ZOFRAN) 4 MG tablet Take 4 mg by mouth every 6 (six) hours as needed for nausea or vomiting.   Yes Historical Provider, MD  oxybutynin (DITROPAN) 5 MG tablet Take 10 mg by mouth 2 (two) times daily. 01/10/14  Yes Historical Provider, MD  oxyCODONE (OXY IR/ROXICODONE) 5 MG immediate release tablet Take 1 tablet (5 mg total) by mouth daily as needed. Patient taking differently: Take 5 mg by mouth daily as needed for moderate pain or severe pain.  11/04/15  Yes Nishant Dhungel, MD  oxyCODONE (OXYCONTIN) 10 mg 12 hr tablet Take 1 tablet (10 mg total) by mouth every 12 (twelve) hours. 11/04/15  Yes Nishant Dhungel, MD  polyethylene glycol (MIRALAX / GLYCOLAX) packet Take 17 g by mouth daily.   Yes Historical Provider, MD  senna (SENOKOT) 8.6 MG tablet Take 2 tablets by mouth 2 (two) times daily as needed for constipation. 06/05/13  Yes Historical Provider, MD  traZODone (DESYREL) 100 MG tablet Take 200 mg by mouth at bedtime. 08/27/14  Yes Historical Provider, MD  trimethoprim (TRIMPEX) 100 MG tablet Take 100 mg by mouth daily. 08/01/15  Yes Historical Provider, MD  clonazePAM (KLONOPIN) 0.5 MG tablet Take 1 tablet (0.5 mg total) by mouth 2 (two) times daily as needed for anxiety. 11/04/15   Nishant Dhungel, MD    Allergies Amoxicillin; Ciprofloxacin; Clindamycin hcl; Penicillins; Codeine; Baclofen; Contrast media [iodinated diagnostic agents]; Erythromycin; Iodides; Latex; Nickel; Soap; Sulfa antibiotics; Tape; Tolterodine; and Vancomycin  No family history on file.  Social History Social History  Substance Use Topics  . Smoking status: Former Games developer  . Smokeless tobacco: Never Used  . Alcohol use No    Review of Systems  Constitutional: Positive for fever. Eyes: No visual changes. ENT: No sore throat. Cardiovascular:  Denies chest pain. Respiratory: Positive for cough. Denies shortness of breath. Gastrointestinal: No abdominal pain.  No nausea, no vomiting.  No diarrhea.  No constipation. Genitourinary: Negative for dysuria. Musculoskeletal: Negative for back pain. Skin: Negative for rash. Neurological: Negative for headaches, focal weakness or numbness.  10-point ROS otherwise negative.  ____________________________________________   PHYSICAL EXAM:  VITAL SIGNS: ED Triage Vitals  Enc Vitals Group     BP 02/25/16 0415 112/67     Pulse Rate 02/25/16 0415 (!) 106     Resp 02/25/16 0415 (!) 24     Temp 02/25/16 0415 (!) 102.8 F (39.3 C)     Temp Source 02/25/16 0415 Rectal     SpO2 02/25/16 0415 94 %     Weight 02/25/16 0423 188 lb (85.3 kg)     Height 02/25/16 0423 5\' 3"  (1.6 m)     Head Circumference --      Peak Flow --      Pain Score --      Pain Loc --      Pain Edu? --  Excl. in GC? --     Constitutional: Alert. Chronically ill-appearing and in mild acute distress. Eyes: Conjunctivae are normal. PERRL. EOMI. Head: Atraumatic. Nose: No congestion/rhinnorhea. Mouth/Throat: Mucous membranes are moist.  Oropharynx non-erythematous. Neck: No stridor.  Supple neck without meningismus. Cardiovascular: Tachycardic rate, regular rhythm. Grossly normal heart sounds.  Good peripheral circulation. Respiratory: Normal respiratory effort.  No retractions. Lungs with scattered rhonchi. Gastrointestinal: Soft and nontender. No distention. No abdominal bruits. No CVA tenderness. Musculoskeletal: No lower extremity tenderness nor edema.  No joint effusions. Neurologic:  Baseline left hemiplegia. Decreased LOC. Skin:  Skin is warm, dry and intact. No rash noted. No petechiae. Psychiatric: Mood and affect are normal. Speech and behavior are normal.  ____________________________________________   LABS (all labs ordered are listed, but only abnormal results are displayed)  Labs Reviewed    COMPREHENSIVE METABOLIC PANEL - Abnormal; Notable for the following:       Result Value   Glucose, Bld 186 (*)    BUN 22 (*)    Creatinine, Ser 1.01 (*)    ALT 13 (*)    GFR calc non Af Amer 58 (*)    All other components within normal limits  CBC WITH DIFFERENTIAL/PLATELET - Abnormal; Notable for the following:    WBC 3.4 (*)    RDW 16.9 (*)    Lymphs Abs 0.3 (*)    All other components within normal limits  LACTIC ACID, PLASMA - Abnormal; Notable for the following:    Lactic Acid, Venous 4.0 (*)    All other components within normal limits  CULTURE, BLOOD (ROUTINE X 2)  CULTURE, BLOOD (ROUTINE X 2)  URINE CULTURE  INFLUENZA PANEL BY PCR (TYPE A & B)  URINALYSIS, ROUTINE W REFLEX MICROSCOPIC  LACTIC ACID, PLASMA  URINALYSIS, ROUTINE W REFLEX MICROSCOPIC   ____________________________________________  EKG  ED ECG REPORT I, Ileah Falkenstein J, the attending physician, personally viewed and interpreted this ECG.   Date: 02/25/2016  EKG Time: 0410  Rate: 110  Rhythm: sinus tachycardia  Axis: normal  Intervals:none  ST&T Change: nonspecific  ____________________________________________  RADIOLOGY  CT head interpreted per Dr. Manus Gunning: 1. No acute intracranial abnormality.  2. Stable multifocal encephalomalacia, atrophy, and chronic small  vessel ischemia.   Portable chest x-ray interpreted per Dr. Manus Gunning: Patchy right lung base opacity suspicious for pneumonia.    Thoracic aortic atherosclerosis.   ____________________________________________   PROCEDURES  Procedure(s) performed: None  Procedures  Critical Care performed: Yes, see critical care note(s)  CRITICAL CARE Performed by: Irean Hong   Total critical care time: 30 minutes  Critical care time was exclusive of separately billable procedures and treating other patients.  Critical care was necessary to treat or prevent imminent or life-threatening deterioration.  Critical care was time spent  personally by me on the following activities: development of treatment plan with patient and/or surrogate as well as nursing, discussions with consultants, evaluation of patient's response to treatment, examination of patient, obtaining history from patient or surrogate, ordering and performing treatments and interventions, ordering and review of laboratory studies, ordering and review of radiographic studies, pulse oximetry and re-evaluation of patient's condition. ____________________________________________   INITIAL IMPRESSION / ASSESSMENT AND PLAN / ED COURSE  Pertinent labs & imaging results that were available during my care of the patient were reviewed by me and considered in my medical decision making (see chart for details).  66 year old female sent from nursing facility with fever, shortness of breath and reports of focal seizures. Daughter denies report  of seizure activity but requesting CT scan. ED code sepsis initiated.  Clinical Course as of Feb 25 539  Wed Feb 25, 2016  1610 Updated patient and family of results. Discuss edwith hospitalist evaluate patient in the emergency department for admission.  [JS]    Clinical Course User Index [JS] Irean Hong, MD     ____________________________________________   FINAL CLINICAL IMPRESSION(S) / ED DIAGNOSES  Final diagnoses:  Fever, unspecified fever cause  Sepsis, due to unspecified organism (HCC)  HCAP (healthcare-associated pneumonia)  Hypoxia      NEW MEDICATIONS STARTED DURING THIS VISIT:  New Prescriptions   No medications on file     Note:  This document was prepared using Dragon voice recognition software and may include unintentional dictation errors.    Irean Hong, MD 02/25/16 0700

## 2016-02-25 NOTE — ED Notes (Signed)
Patient transported to CT 

## 2016-02-25 NOTE — Progress Notes (Signed)
Pharmacy Note  Spoke to RN who is in pt's room doing admission -  Pt with vancomycin allergy of rash listed. Pt given vancomycin at 0730 this AM.  So far no s/sx of allergic reaction, nothing on arms so far per RN. Also no report from ED RN about s/sx of an allergic reaction.

## 2016-02-25 NOTE — ED Notes (Signed)
Stage 1 pressure ulcer noted to patient's sacrum.  Dressing placed to area.

## 2016-02-25 NOTE — ED Triage Notes (Addendum)
Pt arrived via ems from [facility]. Pt arrived very lethargic and short of breath. Pt 87% on room air. Pt placed on 4L via nasal canula. Rectal temp 120.8. EMS reported complaints from facility of focal seizures and fever. Pt unable to answer questions. No family present at this time.

## 2016-02-25 NOTE — Evaluation (Addendum)
Clinical/Bedside Swallow Evaluation Patient Details  Name: Tammy Shepard MRN: [id number] Date of Birth: 1951-02-07  Today's Date: 02/25/2016 Time: SLP Start Time (ACUTE ONLY): 0915 SLP Stop Time (ACUTE ONLY): 1015 SLP Time Calculation (min) (ACUTE ONLY): 60 min  Past Medical History:  Past Medical History:  Diagnosis Date  . Asthma   . CVA (cerebrovascular accident due to intracerebral hemorrhage) (HCC)   . Diabetes mellitus without complication (HCC)   . Hyperlipemia   . Hypertension   . Seizures (HCC)   . Stroke [facility](HCC)    Past Surgical History:  Past Surgical History:  Procedure Laterality Date  . BRAIN SURGERY    . CERVICAL FUSION     HPI:    Pt is a 66 y/o female w/ past medical history significant for stroke with left hemiparesis presents emergency department with a fever. Upon arrival maximum temperature was 102.36F. The patient had reportedly been in her usual state of health earlier this evening but seemed to rapidly decline. She was found to be very tachypneic and chest x-ray revealed right lung base opacity suspicious for pneumonia. There was also some report of seizure activity at the nursing home but the patient's daughter reports that no mention of seizure activity was made to her nor has she seen any prior to arrival. Lactic acid levels were also elevated on initial laboratory evaluation which found to the emergency department staff to start broad-spectrum antibiotics as well as IV hydration. Currently, pt requires increased verbal/tactile cues for following along w/ task of oral intake; she required cues for following instructions. She appeared intermittently drowsy.    Assessment / Plan / Recommendation Clinical Impression  Pt appears to present w/ increased risk for aspiration evidenced by overt s/s of aspiration w/ trials of thin liquids. Pt was given cup sip trials of thin liquid w/ overt coughing noted immediately w/ trial followed by declined respiratory status  (increased congested respirations) - pt was able to cough and clear the wetness. When pt was given trials of Nectar liquids via cup and straw, no overt s/s of aspiration were noted. Pt consumed these trials and puree trials w/ no change in status during/post trials. Oral phase appeared wfl for bolus management and clearing of these trial consistencies. Pt required feeding support; aspiration precautions. Recommend a Dysphagia level 1 w/ Nectar consistency liquids w/ aspiration precautions; Pills given in Puree. ST services will f/u w/ toleration of diet and trials to upgrade if appropriate; objective swallow assessment as indicated.     Aspiration Risk  Mild aspiration risk;Moderate aspiration risk    Diet Recommendation  Dysphagia level 1 w/ Nectar liquids; strict aspiration precautions; full feeding support at meals. Nutritional supplements as ordered.  Medication Administration: Whole meds with puree (or Crushed in Puree if needed/able to)    Other  Recommendations Recommended Consults:  (Dietician consult) Oral Care Recommendations: Oral care BID;Staff/trained caregiver to provide oral care Other Recommendations: Order thickener from pharmacy;Prohibited food (jello, ice cream, thin soups);Remove water pitcher;Have oral suction available   Follow up Recommendations Skilled Nursing facility (TBD)      Frequency and Duration min 3x week  2 weeks       Prognosis Prognosis for Safe Diet Advancement: Fair Barriers to Reach Goals: Cognitive deficits;Severity of deficits      Swallow Study   General Date of Onset: 02/25/16 Type of Study: Bedside Swallow Evaluation Previous Swallow Assessment: Pt is a [age] h/o female w/ significant past medical history including old CVA with intracerebral hemorrhage  w/ left hemiparesis, DM, HTN, Seizure who presents emergency department with a fever. Upon arrival maximum temperature was 102.60F. The patient had reportedly been in her usual state of health  earlier this evening but seemed to rapidly decline. She was found to be very tachypneic and chest x-ray revealed right lung base opacity suspicious for pneumonia. There was also some report of seizure activity at the nursing home but the patient's daughter reports that no mention of seizure activity was made to her nor has she seen any prior to arrival. Lactic acid levels were also elevated on initial laboratory evaluation which found to the emergency department staff to start broad-spectrum antibiotics as well as IV hydration. Pt presents w/ upper airway congestion/crackling during breathing. With instruction, pt was able to cough and clear this congestion. Pt was mostly nonverbal but was able to give her name and follow basic instruction.  Diet Prior to this Study: Regular (? unsure) Temperature Spikes Noted: No (wbc 3.4) Respiratory Status: Nasal cannula (4 liters) History of Recent Intubation: No Behavior/Cognition: Alert;Cooperative;Pleasant mood;Confused;Distractible;Requires cueing Oral Cavity Assessment: Dry Oral Care Completed by SLP: Recent completion by staff Oral Cavity - Dentition: Edentulous (dentures not with pt) Vision:  (n/a) Self-Feeding Abilities: Total assist Patient Positioning: Upright in bed Baseline Vocal Quality: Low vocal intensity (mostly nonverbal) Volitional Cough: Strong;Congested (min) Volitional Swallow: Able to elicit    Oral/Motor/Sensory Function Overall Oral Motor/Sensory Function:  (grossly wfl w/ bolus management)   Ice Chips Ice chips: Within functional limits Presentation: Spoon (fed; 3 trials)   Thin Liquid Thin Liquid: Impaired Presentation: Cup (fed; 2 trials) Oral Phase Impairments:  (none) Pharyngeal  Phase Impairments: Cough - Immediate;Multiple swallows;Suspected delayed Swallow;Throat Clearing - Delayed (x2/2 trials)    Nectar Thick Nectar Thick Liquid: Within functional limits Presentation: Spoon;Cup;Straw (fed; 8 trials, then 3 traisl via  straw)   Honey Thick Honey Thick Liquid: Not tested   Puree Puree: Within functional limits Presentation: Spoon (fed; 8 trials)   Solid   GO   Solid: Not tested Other Comments: edentulous         Tammy Som, MS, CCC-SLP Tammy Shepard 02/25/2016,12:04 PM

## 2016-02-25 NOTE — Progress Notes (Signed)
Inpatient Diabetes Program Recommendations  AACE/ADA: New Consensus Statement on Inpatient Glycemic Control (2015)  Target Ranges:  Prepandial:   less than 140 mg/dL      Peak postprandial:   less than 180 mg/dL (1-2 hours)      Critically ill patients:  140 - 180 mg/dL  Results for Tammy Shepard, Tammy Shepard (MRN [id number]) as of 02/25/2016 12:22  Ref. Range 02/25/2016 04:24  Glucose Latest Ref Range: 65 - 99 mg/dL 119186 (H)    Review of Glycemic Control  Diabetes history: DM2 Outpatient Diabetes medications: Metformin 500 mg BID Current orders for Inpatient glycemic control: Metformin 500 mg BID  Inpatient Diabetes Program Recommendations: Correction (SSI): While inpatient, please consider ordering CBGs with Novolog sensitive correction scale.  Thanks, Orlando PennerMarie Alayzha An, RN, MSN, CDE Diabetes Coordinator Inpatient Diabetes Program [phone] (Team Pager from 8am to 5pm)

## 2016-02-25 NOTE — ED Notes (Signed)
Daughter at bedside. Daughter reports pt is not normally this lethargic and is normally more verbal. Daughter reports pt did have stroke in past with left side deficits.

## 2016-02-25 NOTE — Progress Notes (Signed)
Pharmacy Antibiotic Note  Tammy Shepard is a 66 y.o. female admitted on 02/25/2016 with pneumonia.  Pharmacy has been consulted for aztreonam dosing.  Plan: Aztreonam 2 gm IV Q8H  Height: 5\' 3"  (160 cm) Weight: 188 lb (85.3 kg) IBW/kg (Calculated) : 52.4  Temp (24hrs), Avg:102.8 F (39.3 C), Min:102.8 F (39.3 C), Max:102.8 F (39.3 C)   Recent Labs Lab 02/25/16 0424  WBC 3.4*  CREATININE 1.01*  LATICACIDVEN 4.0*    Estimated Creatinine Clearance: 58.3 mL/min (by C-G formula based on SCr of 1.01 mg/dL (H)).    Allergies  Allergen Reactions  . Amoxicillin Rash    Tolerating rocephin 10/29/15  . Ciprofloxacin Anaphylaxis  . Clindamycin Hcl Swelling, Rash and Other (See Comments)    Other Reaction: BAD RASH AND SWELLING, SKIN BR Severe rash and swelling. Skin break down.  Marland Kitchen. Penicillins Anaphylaxis    Tolerating rocephin 11/01/15  . Codeine Nausea And Vomiting  . Baclofen Itching and Rash  . Contrast Media [Iodinated Diagnostic Agents] Itching and Rash  . Erythromycin Rash  . Iodides Rash    Povidone iodine; betadine  . Latex Itching and Rash  . Nickel Rash and Other (See Comments)    OTHER REACTION  . Soap Itching and Rash  . Sulfa Antibiotics Rash  . Tape Rash    (silk tape only) skin break down  . Tolterodine Rash  . Vancomycin Rash   Thank you for allowing pharmacy to be a part of this patient's care.  Carola FrostNathan A Shary Lamos, Pharm.D., BCPS Clinical Pharmacist 02/25/2016 6:35 AM

## 2016-02-25 NOTE — ED Notes (Signed)
Admitting MD in room to assess patient at this time.   

## 2016-02-25 NOTE — Progress Notes (Signed)
Pharmacy Antibiotic Note  Tammy Shepard is a 66 y.o. female admitted on 02/25/2016 with pneumonia.  Pharmacy has been consulted for aztreonam and vancomycin dosing.  Plan: 1. Aztreonam 2 gm IV Q8H 2. Vancomycin 1500 mg IV x 1 in ED followed by vancomycin 750 mg IV Q12H, predicted trough 19 mcg/mL. Pharmacy will continue to follow and adjust as needed to maintain trough 15 to 20 mcg/mL. Maintenance infusion scheduled to be administered over slower rate (750 mg/90 minutes) for history of reaction to vancomycin.   Vd 45.7 L, Ke 0.053 hr-1, T1/2 13.2 hr  Height: 5\' 3"  (160 cm) Weight: 188 lb (85.3 kg) IBW/kg (Calculated) : 52.4  Temp (24hrs), Avg:100.2 F (37.9 C), Min:98.5 F (36.9 C), Max:102.8 F (39.3 C)   Recent Labs Lab 02/25/16 0424 02/25/16 0725  WBC 3.4*  --   CREATININE 1.01*  --   LATICACIDVEN 4.0* 2.7*    Estimated Creatinine Clearance: 58.3 mL/min (by C-G formula based on SCr of 1.01 mg/dL (H)).    Allergies  Allergen Reactions  . Amoxicillin Rash    Tolerating rocephin 10/29/15  . Ciprofloxacin Anaphylaxis  . Clindamycin Hcl Swelling, Rash and Other (See Comments)    Other Reaction: BAD RASH AND SWELLING, SKIN BR Severe rash and swelling. Skin break down.  Marland Kitchen. Penicillins Anaphylaxis    Tolerating rocephin 11/01/15  . Codeine Nausea And Vomiting  . Baclofen Itching and Rash  . Contrast Media [Iodinated Diagnostic Agents] Itching and Rash  . Erythromycin Rash  . Iodides Rash    Povidone iodine; betadine  . Latex Itching and Rash  . Nickel Rash and Other (See Comments)    OTHER REACTION  . Soap Itching and Rash  . Sulfa Antibiotics Rash  . Tape Rash    (silk tape only) skin break down  . Tolterodine Rash  . Vancomycin Rash   Thank you for allowing pharmacy to be a part of this patient's care.  Carola FrostNathan A Babbette Dalesandro, Pharm.D., BCPS Clinical Pharmacist 02/25/2016 9:20 PM

## 2016-02-26 LAB — CBC
HCT: 27.6 % — ABNORMAL LOW (ref 35.0–47.0)
Hemoglobin: 9.6 g/dL — ABNORMAL LOW (ref 12.0–16.0)
MCH: 35.1 pg — AB (ref 26.0–34.0)
MCHC: 34.7 g/dL (ref 32.0–36.0)
MCV: 101.2 fL — ABNORMAL HIGH (ref 80.0–100.0)
PLATELETS: 144 10*3/uL — AB (ref 150–440)
RBC: 2.73 MIL/uL — ABNORMAL LOW (ref 3.80–5.20)
RDW: 17.1 % — AB (ref 11.5–14.5)
WBC: 11.5 10*3/uL — ABNORMAL HIGH (ref 3.6–11.0)

## 2016-02-26 LAB — URINE CULTURE: Culture: >=100000 — AB

## 2016-02-26 LAB — BASIC METABOLIC PANEL
Anion gap: 3 — ABNORMAL LOW (ref 5–15)
BUN: 29 mg/dL — AB (ref 6–20)
CHLORIDE: 117 mmol/L — AB (ref 101–111)
CO2: 25 mmol/L (ref 22–32)
CREATININE: 0.88 mg/dL (ref 0.44–1.00)
Calcium: 7.8 mg/dL — ABNORMAL LOW (ref 8.9–10.3)
GFR calc Af Amer: >60 mL/min (ref >60)
Glucose, Bld: 98 mg/dL (ref 65–99)
Potassium: 4.1 mmol/L (ref 3.5–5.1)
SODIUM: 145 mmol/L (ref 135–145)

## 2016-02-26 LAB — GLUCOSE, CAPILLARY
Glucose-Capillary: 125 mg/dL — ABNORMAL HIGH (ref 65–99)
Glucose-Capillary: 181 mg/dL — ABNORMAL HIGH (ref 65–99)
Glucose-Capillary: 99 mg/dL (ref 65–99)
Glucose-Capillary: 86 mg/dL (ref 65–99)

## 2016-02-26 MED ORDER — LORAZEPAM 0.5 MG PO TABS
0.2500 mg | ORAL_TABLET | Freq: Once | ORAL | Status: AC
  Administered 2016-02-27: 0.25 mg via ORAL
  Filled 2016-02-26: qty 1
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
## 2016-02-26 NOTE — Progress Notes (Addendum)
Speech Language Pathology Treatment: Dysphagia  Patient Details Name: Tammy Shepard MRN: [id number] DOB: 30-Nov-1950 Today's Date: 02/26/2016 Time: [id number] SLP Time Calculation (min) (ACUTE ONLY): 45 min  Assessment / Plan / Recommendation Clinical Impression  Pt seen for toleration of current dysphagia diet and for trials of thin liquids to hopefully upgrade her diet consistency. Pt continues to be without her dentures which she stated she wears when eating to trials to upgrade liquids were given today. Pt positioned upright in bed and given small sip trials of thin liquids via cup then straw. No overt s/s of aspiration were noted as she accepted trials - strict aspiration precautions were followed d/t pt's impulsive drinking behavior(pinched straw when necessary). Discussed the need to use small sips slowly w/ pt; education on other aspiration precautions as well given. Unsure of pt's full comprehension of instruction d/t baseline Cognitive status. Precautions posted in room.  Pt continues to present w/ increased risk for aspiration d/t current status. ST will f/u to monitor status, give trials to upgrade diet consistency if able; education. NSG updated.    HPI   Pt is a 66 y/o female w/ past medical history significant for stroke with left hemiparesis presents emergency department with a fever. Upon arrival maximum temperature was 102.35F. The patient had reportedly been in her usual state of health earlier this evening but seemed to rapidly decline. She was found to be very tachypneic and chest x-ray revealed right lung base opacity suspicious for pneumonia. There was also some report of seizure activity at the nursing home but the patient's daughter reports that no mention of seizure activity was made to her nor has she seen any prior to arrival. Lactic acid levels were also elevated on initial laboratory evaluation which found to the emergency department staff to start broad-spectrum  antibiotics as well as IV hydration. Currently, pt's cognitive status is improved somewhat for following along w/ tasks of oral intake w/ aspiration precautions. She continues to not have her dentures w/ her at this time.       SLP Plan  Continue with current plan of care     Recommendations  Diet recommendations: Dysphagia 1 (puree);Thin liquid Liquids provided via: Cup;Straw (monitor straw drinking) Medication Administration: Whole meds with puree (crushed if needed for easier swallowing and if able) Supervision: Staff to assist with self feeding;Full supervision/cueing for compensatory strategies Compensations: Minimize environmental distractions;Slow rate;Small sips/bites;Lingual sweep for clearance of pocketing;Follow solids with liquid Postural Changes and/or Swallow Maneuvers: Seated upright 90 degrees;Upright 30-60 min after meal                General recommendations:  (Dietician f/u) Oral Care Recommendations: Oral care BID;Staff/trained caregiver to provide oral care Follow up Recommendations: Skilled Nursing facility Plan: Continue with current plan of care       GO               Tammy SomKatherine Maja Mccaffery, MS, CCC-SLP Tammy Shepard 02/26/2016, 2:30 PM

## 2016-02-26 NOTE — Plan of Care (Signed)
Problem: Nutrition: Goal: Adequate nutrition will be maintained Outcome: Progressing Pt was assisted with feeding at every meal.

## 2016-02-26 NOTE — NC FL2 (Signed)
[place] MEDICAID FL2 LEVEL OF CARE SCREENING TOOL     IDENTIFICATION  Patient Name: Tammy Shepard Birthdate: Oct 14, 1950 Sex: female Admission Date (Current Location): 02/25/2016  [place]   Facility and Address:  [facility], [address], [place], [place] [address]      Provider Number: [id number]  Attending Physician Name and Address:  Ramonita LabAruna Yukie Bergeron, MD  Relative Name and Phone Number:       Current Level of Care: Hospital Recommended Level of Care: Skilled Nursing Facility Prior Approval Number:    Date Approved/Denied:   PASRR Number: [id number]  Discharge Plan: SNF    Current Diagnoses: Patient Active Problem List   Diagnosis Date Noted  . Sepsis (HCC) 02/25/2016  . H/O: CVA (cerebrovascular accident) 11/04/2015  . Hypotension 11/04/2015  . Essential hypertension, benign 11/04/2015  . Fever in adult   . Generalized weakness   . 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 Fever 10/31/2015  . UTI (lower urinary tract infection) 10/31/2015    Orientation RESPIRATION BLADDER Height & Weight     Self  Normal, O2 (3liters) Incontinent Weight: 164 lb 3.2 oz (74.5 kg) Height:  5\' 3"  (160 cm)  BEHAVIORAL SYMPTOMS/MOOD NEUROLOGICAL BOWEL NUTRITION STATUS   (none) Convulsions/Seizures Incontinent Diet (dysphagia 1)  AMBULATORY STATUS COMMUNICATION OF NEEDS Skin   Total Care Verbally Normal                       Personal Care Assistance Level of Assistance  Total care       Total Care Assistance: Maximum assistance   Functional Limitations Info   (none)          SPECIAL CARE FACTORS FREQUENCY                       Contractures Contractures Info: Present    Additional Factors Info  Code Status Code Status Info: full             Current Medications (02/26/2016):  This is the current hospital active medication list Current Facility-Administered Medications  Medication Dose Route  Frequency Provider Last Rate Last Dose  . 0.9 %  sodium chloride infusion   Intravenous Continuous Arnaldo NatalMichael S Diamond, MD 125 mL/hr at 02/26/16 1025    . acetaminophen (TYLENOL) tablet 650 mg  650 mg Oral Q6H PRN Arnaldo NatalMichael S Diamond, MD       Or  . acetaminophen (TYLENOL) suppository 650 mg  650 mg Rectal Q6H PRN Arnaldo NatalMichael S Diamond, MD      . albuterol (PROVENTIL) (2.5 MG/3ML) 0.083% nebulizer solution 2.5 mg  2.5 mg Nebulization Q8H PRN Arnaldo NatalMichael S Diamond, MD      . ARIPiprazole (ABILIFY) tablet 10 mg  10 mg Oral Daily Arnaldo NatalMichael S Diamond, MD   10 mg at 02/26/16 1013  . aspirin EC tablet 81 mg  81 mg Oral Daily Arnaldo NatalMichael S Diamond, MD   81 mg at 02/26/16 1013  . atorvastatin (LIPITOR) tablet 40 mg  40 mg Oral Clarita LeberBH-q7a Michael S Diamond, MD   40 mg at 02/26/16 [id number]  . aztreonam (AZACTAM) 2 g in dextrose 5 % 50 mL IVPB  2 g Intravenous Q8H Irean HongJade J Sung, MD   2 g at 02/26/16 [phone]  . clonazePAM (KLONOPIN) tablet 0.5 mg  0.5 mg Oral BID PRN Arnaldo NatalMichael S Diamond, MD   0.5 mg at 02/25/16 2333  . divalproex (DEPAKOTE SPRINKLE) capsule 500 mg  500 mg Oral Daily Ramonita Lab, MD   500 mg at 02/26/16 1013   And  . divalproex (DEPAKOTE SPRINKLE) capsule 750 mg  750 mg Oral Q2200 Donia Yokum, MD      . enoxaparin (LOVENOX) injection 40 mg  40 mg Subcutaneous Q24H Arnaldo Natal, MD   40 mg at 02/25/16 2158  . FLUoxetine (PROZAC) capsule 40 mg  40 mg Oral Daily Arnaldo Natal, MD   40 mg at 02/26/16 1000  . insulin aspart (novoLOG) injection 0-5 Units  0-5 Units Subcutaneous QHS Jazaria Jarecki, MD      . insulin aspart (novoLOG) injection 0-9 Units  0-9 Units Subcutaneous TID WC Ramonita Lab, MD   2 Units at 02/26/16 1243  . levothyroxine (SYNTHROID, LEVOTHROID) tablet 75 mcg  75 mcg Oral QAC breakfast Arnaldo Natal, MD   75 mcg at 02/26/16 1013  . losartan (COZAAR) tablet 50 mg  50 mg Oral BID Arnaldo Natal, MD   50 mg at 02/26/16 1013  . meloxicam (MOBIC) tablet 15 mg  15 mg Oral Daily PRN Arnaldo Natal, MD       . metFORMIN (GLUCOPHAGE) tablet 500 mg  500 mg Oral BID WC Arnaldo Natal, MD   500 mg at 02/26/16 1013  . ondansetron (ZOFRAN) tablet 4 mg  4 mg Oral Q6H PRN Arnaldo Natal, MD       Or  . ondansetron [facility]) injection 4 mg  4 mg Intravenous Q6H PRN Arnaldo Natal, MD      . oxybutynin [facility]) tablet 10 mg  10 mg Oral BID Arnaldo Natal, MD   10 mg at 02/26/16 1013  . oxyCODONE (Oxy IR/ROXICODONE) immediate release tablet 5 mg  5 mg Oral Daily PRN Arnaldo Natal, MD      . oxyCODONE (OXYCONTIN) 12 hr tablet 10 mg  10 mg Oral Q12H Arnaldo Natal, MD   10 mg at 02/26/16 1014  . pantoprazole (PROTONIX) EC tablet 40 mg  40 mg Oral Daily Arnaldo Natal, MD   40 mg at 02/26/16 1013  . polyethylene glycol (MIRALAX / GLYCOLAX) packet 17 g  17 g Oral Daily Arnaldo Natal, MD   17 g at 02/26/16 1014  . senna (SENOKOT) tablet 17.2 mg  2 tablet Oral BID PRN Arnaldo Natal, MD      . sodium chloride flush (NS) 0.9 % injection 3 mL  3 mL Intravenous Q12H Arnaldo Natal, MD   3 mL at 02/26/16 1000  . trimethoprim (TRIMPEX) tablet 100 mg  100 mg Oral Daily Arnaldo Natal, MD   100 mg at 02/26/16 1025  . vancomycin (VANCOCIN) IVPB 750 mg/150 ml premix  750 mg Intravenous Q12H Ramonita Lab, MD   750 mg at 02/26/16 1025  . vitamin B-12 (CYANOCOBALAMIN) tablet 1,000 mcg  1,000 mcg Oral Daily Arnaldo Natal, MD   1,000 mcg at 02/26/16 1013     Discharge Medications: Please see discharge summary for a list of discharge medications.  Relevant Imaging Results:  Relevant Lab Results:   Additional Information    York Spaniel, LCSW

## 2016-02-26 NOTE — Progress Notes (Addendum)
[facility] [place] [facility]   PATIENT NAME: Tammy SiasCindy Shepard    MR#:  [id number]  DATE OF BIRTH:  03-23-1950  SUBJECTIVE:  CHIEF COMPLAINT:  Pt is [place] but mumbling  ch left sided weakness from old CVA  REVIEW OF SYSTEMS:  Ros - Unobtainable   DRUG ALLERGIES:   Allergies  Allergen Reactions  . Amoxicillin Rash    Tolerating rocephin 10/29/15  . Ciprofloxacin Anaphylaxis  . Clindamycin Hcl Swelling, Rash and Other (See Comments)    Other Reaction: BAD RASH AND SWELLING, SKIN BR Severe rash and swelling. Skin break down.  Marland Kitchen. Penicillins Anaphylaxis    Tolerating rocephin 11/01/15  . Codeine Nausea And Vomiting  . Baclofen Itching and Rash  . Contrast Media [Iodinated Diagnostic Agents] Itching and Rash  . Erythromycin Rash  . Iodides Rash    Povidone iodine; betadine  . Latex Itching and Rash  . Nickel Rash and Other (See Comments)    OTHER REACTION  . Soap Itching and Rash  . Sulfa Antibiotics Rash  . Tape Rash    (silk tape only) skin break down  . Tolterodine Rash  . Vancomycin Rash    VITALS:  Blood pressure (!) 110/48, pulse 79, temperature 98.7 F (37.1 C), temperature source Oral, resp. rate 20, height 5\' 3"  (1.6 m), weight 74.5 kg (164 lb 3.2 oz), SpO2 95 %.  PHYSICAL EXAMINATION:  GENERAL:  66 y.o.-year-old patient lying in the bed with no acute distress.  EYES: Pupils equal, round, reactive to light and accommodation. No scleral icterus.  HEENT: Head atraumatic, normocephalic. Oropharynx and nasopharynx clear.  NECK:  Supple, no jugular venous distention. No thyroid enlargement, no tenderness.  LUNGS: Diminished breath sounds bilaterally,with gurgly sounds no wheezing,  Positive rales,rhonchi/ crepitation. No use of accessory muscles of respiration.  CARDIOVASCULAR: S1, S2 normal. No murmurs, rubs, or gallops.  ABDOMEN: Soft, nontender, nondistended. Bowel sounds present. No organomegaly or mass.  EXTREMITIES: No pedal  edema, cyanosis, or clubbing.  NEUROLOGIC: pt with AMS ch left sided contractures. Gait not checked.  PSYCHIATRIC: The patient is delerious  SKIN: No obvious rash, lesion, or ulcer.    LABORATORY PANEL:   CBC  Recent Labs Lab 02/26/16 0404  WBC 11.5*  HGB 9.6*  HCT 27.6*  PLT 144*   ------------------------------------------------------------------------------------------------------------------  Chemistries   Recent Labs Lab 02/25/16 0424 02/26/16 0404  NA 144 145  K 4.1 4.1  CL 109 117*  CO2 25 25  GLUCOSE 186* 98  BUN 22* 29*  CREATININE 1.01* 0.88  CALCIUM 9.3 7.8*  AST 21  --   ALT 13*  --   ALKPHOS 42  --   BILITOT 0.8  --    ------------------------------------------------------------------------------------------------------------------  Cardiac Enzymes No results for input(s): TROPONINI in the last 168 hours. ------------------------------------------------------------------------------------------------------------------  RADIOLOGY:  Ct Head Wo Contrast  Result Date: 02/25/2016 CLINICAL DATA:  Decreased level of consciousness.  Lethargy. EXAM: CT HEAD WITHOUT CONTRAST TECHNIQUE: Contiguous axial images were obtained from the base of the skull through the vertex without intravenous contrast. COMPARISON:  Most recent head CT 10/31/2015 FINDINGS: Brain: No intracranial hemorrhage. Chronic encephalomalacia involving both frontal lobes, right parietal lobe, and right occipital lobe, unchanged. Moderate chronic small vessel ischemia elsewhere. Stable atrophy. No subdural or extra-axial fluid collection. Vascular: No hyperdense vessel or unexpected calcification. Skull: Normal. Negative for fracture or focal lesion. Sinuses/Orbits: Opacification of lower left mastoid air cells is chronic. No paranasal sinus inflammation. Other: None. IMPRESSION: 1.  No acute intracranial abnormality. 2. Stable multifocal encephalomalacia, atrophy, and chronic small vessel ischemia.  Electronically Signed   By: Rubye Oaks M.D.   On: 02/25/2016 05:26   Dg Chest Portable 1 View  Result Date: 02/25/2016 CLINICAL DATA:  Weakness and shortness of breath. EXAM: PORTABLE CHEST 1 VIEW COMPARISON:  10/31/2015 FINDINGS: Patchy opacity at the right lung base suspicious for pneumonia. Minimal left basilar atelectasis. Unchanged heart size and mediastinal contours. There is atherosclerosis of the thoracic aorta. No pulmonary edema or pleural fluid. No pneumothorax. Unchanged osseous structures. Surgical hardware in the lower cervical spine is partially included. IMPRESSION: Patchy right lung base opacity suspicious for pneumonia. Thoracic aortic atherosclerosis. Electronically Signed   By: Rubye Oaks M.D.   On: 02/25/2016 05:27    EKG:   Orders placed or performed during the hospital encounter of 02/25/16  . ED EKG 12-Lead  . ED EKG 12-Lead  . EKG 12-Lead  . EKG 12-Lead  . ED EKG 12-Lead  . ED EKG 12-Lead    ASSESSMENT AND PLAN:   This is a 67 year old female admitted for fever , dx with sepsis  #. Sepsis: The patient meets criteria via fever, tachypnea and leukopenia.  Lactic acid is  4.0 --2.7.  Aggressively hydrate with intravenous fluid.  continue  aztreonam and MRSA Screen negative discontinue  vancomycin (unclear reaction; observe for allergy response).  Patient is allergic to penicillin anaphylaxis  Follow blood cultures negative so far  Urine culture with lactobacillus greater than 100,000 colonies!!!! ID consult  #. Pneumonia: Healthcare associated.  Supplemental oxygen as needed.  Iv Antibiotics with aztreonam and discontinue vancomycin ST evaluated pt as there is a concern for aspiration - recommends puree diet with nectar thick liquids  #Acute anemia hemoglobin dropped by 4 points-could be from hemoconcentration to hemodilution Hemoglobin 13.7--9.6 Discontinue aspirin and Mobic for now  Check CBC in a.m. Stool for occult blood  3. Essential  hypertension:pt is hypotensive   Hold patient's ARB, clonidine and amlodipine  4. Diabetes mellitus type : Hold oral hypoglycemic agents. Sliding sole insulin while hospitalized  5. Hyperlipidemia: Continue statin therapy  6. Acute kidney injury: Mild; avoid nephrotoxic agents including NSAIDs (hold meloxicam)  7. Seizure disorder: Continue Depakote  8. Hypothyroidism: Continue Synthroid. Check TSH  9. Mood disorder: Continue Abilify and Prozac when pt is awake and alert  H/o CVA with left sided weakness and contractures Hold trazodone and neurontin as pt is delerious  10. DVT prophylaxis: Lovenox 11. GI prophylaxis: None     All the records are reviewed and case discussed with Care Management/Social Workerr. Management plans discussed with the patient, family and they are in agreement.  CODE STATUS: FC  TOTAL TIME TAKING CARE OF THIS PATIENT: 36  minutes.   POSSIBLE D/C IN 2-3  DAYS, DEPENDING ON CLINICAL CONDITION.  Note: This dictation was prepared with Dragon dictation along with smaller phrase technology. Any transcriptional errors that result from this process are unintentional.   Ramonita Lab M.D on 02/26/2016 at 5:32 PM  Between 7am to 6pm - Pager - [phone] After 6pm go to [email or web address] - password [email or web address]  [facility]  Office  [phone]  CC: Primary care physician; Laurence Aly, MD

## 2016-02-27 LAB — CBC
HEMATOCRIT: 27.6 % — AB (ref 35.0–47.0)
Hemoglobin: 9.4 g/dL — ABNORMAL LOW (ref 12.0–16.0)
MCH: 34.6 pg — AB (ref 26.0–34.0)
MCHC: 34.1 g/dL (ref 32.0–36.0)
MCV: 101.5 fL — AB (ref 80.0–100.0)
Platelets: 128 10*3/uL — ABNORMAL LOW (ref 150–440)
RBC: 2.72 MIL/uL — ABNORMAL LOW (ref 3.80–5.20)
RDW: 16.5 % — AB (ref 11.5–14.5)
WBC: 11.7 10*3/uL — ABNORMAL HIGH (ref 3.6–11.0)

## 2016-02-27 LAB — HEMOGLOBIN A1C
Hgb A1c MFr Bld: 5.4 % (ref 4.8–5.6)
Hgb A1c MFr Bld: 5.5 % (ref 4.8–5.6)
Mean Plasma Glucose: 108 mg/dL
Mean Plasma Glucose: 111 mg/dL

## 2016-02-27 LAB — URINALYSIS, COMPLETE (UACMP) WITH MICROSCOPIC
BILIRUBIN URINE: NEGATIVE
Glucose, UA: NEGATIVE mg/dL
Ketones, ur: NEGATIVE mg/dL
Leukocytes, UA: NEGATIVE
NITRITE: NEGATIVE
PH: 6 (ref 5.0–8.0)
Protein, ur: NEGATIVE mg/dL
SPECIFIC GRAVITY, URINE: 1.008 (ref 1.005–1.030)
Squamous Epithelial / LPF: NONE SEEN

## 2016-02-27 LAB — GLUCOSE, CAPILLARY
Glucose-Capillary: 87 mg/dL (ref 65–99)
Glucose-Capillary: 68 mg/dL (ref 65–99)
Glucose-Capillary: 79 mg/dL (ref 65–99)
Glucose-Capillary: 123 mg/dL — ABNORMAL HIGH (ref 65–99)

## 2016-02-27 LAB — CREATININE, SERUM
Creatinine, Ser: 0.72 mg/dL (ref 0.44–1.00)
GFR calc Af Amer: >60 mL/min (ref >60)
GFR calc non Af Amer: >60 mL/min (ref >60)

## 2016-02-27 MED ORDER — DOXYCYCLINE HYCLATE 100 MG PO TABS
100.0000 mg | ORAL_TABLET | Freq: Two times a day (BID) | ORAL | Status: DC
  Administered 2016-02-27 – 2016-02-28 (×3): 100 mg via ORAL
  Filled 2016-02-27 (×3): qty 1

## 2016-02-27 MED ORDER — METRONIDAZOLE 500 MG PO TABS
500.0000 mg | ORAL_TABLET | Freq: Two times a day (BID) | ORAL | Status: DC
  Administered 2016-02-27 – 2016-02-28 (×2): 500 mg via ORAL
  Filled 2016-02-27 (×3): qty 1

## 2016-02-27 NOTE — Consult Note (Signed)
[facility] Infectious Disease     Reason for Consult: PNA, UTI     Referring Physician: Gouru, A Date of Admission:  02/25/2016   Active Problems:   Sepsis (HCC)   HPI: LATAJA NEWLAND is a 66 y.o. female admitted with fever to 102.8 as well as possible PNA on CX and possible seizure activity at [facility]. She currently reports some cough and chest pain.   Past Medical History:  Diagnosis Date  . Asthma   . CVA (cerebrovascular accident due to intracerebral hemorrhage) (HCC)   . Diabetes mellitus without complication (HCC)   . Hyperlipemia   . Hypertension   . Seizures (HCC)   . Stroke [facility])    Past Surgical History:  Procedure Laterality Date  . BRAIN SURGERY    . CERVICAL FUSION     Social History  Substance Use Topics  . Smoking status: Former Games developer  . Smokeless tobacco: Never Used  . Alcohol use No   No family history on file.  Allergies:  Allergies  Allergen Reactions  . Amoxicillin Rash    Tolerating rocephin 10/29/15  . Ciprofloxacin Anaphylaxis  . Clindamycin Hcl Swelling, Rash and Other (See Comments)    Other Reaction: BAD RASH AND SWELLING, SKIN BR Severe rash and swelling. Skin break down.  Marland Kitchen Penicillins Anaphylaxis    Tolerating rocephin 11/01/15  . Codeine Nausea And Vomiting  . Baclofen Itching and Rash  . Contrast Media [Iodinated Diagnostic Agents] Itching and Rash  . Erythromycin Rash  . Iodides Rash    Povidone iodine; betadine  . Latex Itching and Rash  . Nickel Rash and Other (See Comments)    OTHER REACTION  . Soap Itching and Rash  . Sulfa Antibiotics Rash  . Tape Rash    (silk tape only) skin break down  . Tolterodine Rash  . Vancomycin Rash    Current antibiotics: Antibiotics Given (last 72 hours)    Date/Time Action Medication Dose Rate   02/25/16 1517 Given   aztreonam (AZACTAM) 2 g in dextrose 5 % 50 mL IVPB 2 g 100 mL/hr   02/25/16 2158 Given   aztreonam (AZACTAM) 2 g in dextrose 5 % 50 mL IVPB 2 g 100 mL/hr   02/25/16  2249 Given   vancomycin (VANCOCIN) IVPB 750 mg/150 ml premix 750 mg 100 mL/hr   02/26/16 1813 Given   aztreonam (AZACTAM) 2 g in dextrose 5 % 50 mL IVPB 2 g 100 mL/hr   02/26/16 1025 Given   trimethoprim (TRIMPEX) tablet 100 mg 100 mg    02/26/16 1025 Given   vancomycin (VANCOCIN) IVPB 750 mg/150 ml premix 750 mg 100 mL/hr   02/26/16 1513 Given   aztreonam (AZACTAM) 2 g in dextrose 5 % 50 mL IVPB 2 g 100 mL/hr   02/26/16 2133 Given   aztreonam (AZACTAM) 2 g in dextrose 5 % 50 mL IVPB 2 g 100 mL/hr   02/27/16 0615 Given   aztreonam (AZACTAM) 2 g in dextrose 5 % 50 mL IVPB 2 g 100 mL/hr      MEDICATIONS: . ARIPiprazole  10 mg Oral Daily  . atorvastatin  40 mg Oral BH-q7a  . aztreonam  2 g Intravenous Q8H  . divalproex  500 mg Oral Daily   And  . divalproex  750 mg Oral Q2200  . enoxaparin (LOVENOX) injection  40 mg Subcutaneous Q24H  . FLUoxetine  40 mg Oral Daily  . insulin aspart  0-5 Units Subcutaneous QHS  . insulin aspart  0-9 Units Subcutaneous TID WC  . levothyroxine  75 mcg Oral QAC breakfast  . losartan  50 mg Oral BID  . oxybutynin  10 mg Oral BID  . oxyCODONE  10 mg Oral Q12H  . pantoprazole  40 mg Oral Daily  . polyethylene glycol  17 g Oral Daily  . sodium chloride flush  3 mL Intravenous Q12H  . vitamin B-12  1,000 mcg Oral Daily    Review of Systems - 11 systems reviewed and negative per HPI   OBJECTIVE: Temp:  [98.1 F (36.7 C)-99.9 F (37.7 C)] 98.5 F (36.9 C) (01/19 0753) Pulse Rate:  [79-93] 93 (01/19 0753) Resp:  [18-19] 18 (01/19 0753) BP: (108-133)/(54-69) 123/55 (01/19 0753) SpO2:  [94 %-100 %] 94 % (01/19 0753) Weight:  [76.8 kg (169 lb 6.4 oz)] 76.8 kg (169 lb 6.4 oz) (01/19 0500) Physical Exam  Constitutional:  oriented to person, place, and time.chronically ill appearing HENT: [place]/AT, PERRLA, no scleral icterus Mouth/Throat: Oropharynx is clear and dry . No oropharyngeal exudate.  Cardiovascular: Normal rate, regular rhythm and normal  heart sounds Pulmonary/Chest: bil rhonchi  Neck = supple, no nuchal rigidity Abdominal: Soft. Bowel sounds are normal.  exhibits no distension. There is no tenderness.  Lymphadenopathy: no cervical adenopathy. No axillary adenopathy Neurological:LUE contracture and weakness. L facial droop Skin: Skin is warm and dry. No rash noted. No erythema.  Psychiatric:flat affect   LABS: Results for orders placed or performed during the hospital encounter of 02/25/16 (from the past 48 hour(s))  MRSA PCR Screening     Status: None   Collection Time: 02/25/16 11:54 AM  Result Value Ref Range   MRSA by PCR NEGATIVE NEGATIVE    Comment:        The GeneXpert MRSA Assay (FDA approved for NASAL specimens only), is one component of a comprehensive MRSA colonization surveillance program. It is not intended to diagnose MRSA infection nor to guide or monitor treatment for MRSA infections.   Glucose, capillary     Status: Abnormal   Collection Time: 02/25/16  9:42 PM  Result Value Ref Range   Glucose-Capillary 119 (H) 65 - 99 mg/dL   Comment 1 Notify RN   Hemoglobin A1c     Status: None   Collection Time: 02/26/16  4:04 AM  Result Value Ref Range   Hgb A1c MFr Bld 5.5 4.8 - 5.6 %    Comment: (NOTE)         Pre-diabetes: 5.7 - 6.4         Diabetes: >6.4         Glycemic control for adults with diabetes: <7.0    Mean Plasma Glucose 111 mg/dL    Comment: (NOTE) Performed At: [facility] [place], [place] [id number] Lindon Romp MD BS:[id number]   CBC     Status: Abnormal   Collection Time: 02/26/16  4:04 AM  Result Value Ref Range   WBC 11.5 (H) 3.6 - 11.0 K/uL   RBC 2.73 (L) 3.80 - 5.20 MIL/uL   Hemoglobin 9.6 (L) 12.0 - 16.0 g/dL    Comment: RESULT REPEATED AND VERIFIED   HCT 27.6 (L) 35.0 - 47.0 %   MCV 101.2 (H) 80.0 - 100.0 fL   MCH 35.1 (H) 26.0 - 34.0 pg   MCHC 34.7 32.0 - 36.0 g/dL   RDW 17.1 (H) 11.5 - 14.5 %   Platelets 144 (L) 150 - 440 K/uL   Basic metabolic panel  Status: Abnormal   Collection Time: 02/26/16  4:04 AM  Result Value Ref Range   Sodium 145 135 - 145 mmol/L   Potassium 4.1 3.5 - 5.1 mmol/L   Chloride 117 (H) 101 - 111 mmol/L   CO2 25 22 - 32 mmol/L   Glucose, Bld 98 65 - 99 mg/dL   BUN 29 (H) 6 - 20 mg/dL   Creatinine, Ser 0.09 0.44 - 1.00 mg/dL   Calcium 7.8 (L) 8.9 - 10.3 mg/dL   GFR calc non Af Amer >60 >60 mL/min   GFR calc Af Amer >60 >60 mL/min    Comment: (NOTE) The eGFR has been calculated using the CKD EPI equation. This calculation has not been validated in all clinical situations. eGFR's persistently <60 mL/min signify possible Chronic Kidney Disease.    Anion gap 3 (L) 5 - 15  Glucose, capillary     Status: Abnormal   Collection Time: 02/26/16  7:44 AM  Result Value Ref Range   Glucose-Capillary 125 (H) 65 - 99 mg/dL  Glucose, capillary     Status: Abnormal   Collection Time: 02/26/16 11:43 AM  Result Value Ref Range   Glucose-Capillary 181 (H) 65 - 99 mg/dL  Glucose, capillary     Status: None   Collection Time: 02/26/16  4:42 PM  Result Value Ref Range   Glucose-Capillary 99 65 - 99 mg/dL  Glucose, capillary     Status: None   Collection Time: 02/26/16  9:32 PM  Result Value Ref Range   Glucose-Capillary 86 65 - 99 mg/dL  CBC     Status: Abnormal   Collection Time: 02/27/16  5:36 AM  Result Value Ref Range   WBC 11.7 (H) 3.6 - 11.0 K/uL   RBC 2.72 (L) 3.80 - 5.20 MIL/uL   Hemoglobin 9.4 (L) 12.0 - 16.0 g/dL   HCT 20.0 (L) 41.5 - 93.0 %   MCV 101.5 (H) 80.0 - 100.0 fL   MCH 34.6 (H) 26.0 - 34.0 pg   MCHC 34.1 32.0 - 36.0 g/dL   RDW 12.3 (H) 79.9 - 09.4 %   Platelets 128 (L) 150 - 440 K/uL  Creatinine, serum     Status: None   Collection Time: 02/27/16  5:36 AM  Result Value Ref Range   Creatinine, Ser 0.72 0.44 - 1.00 mg/dL   GFR calc non Af Amer >60 >60 mL/min   GFR calc Af Amer >60 >60 mL/min    Comment: (NOTE) The eGFR has been calculated using the CKD EPI  equation. This calculation has not been validated in all clinical situations. eGFR's persistently <60 mL/min signify possible Chronic Kidney Disease.   Glucose, capillary     Status: None   Collection Time: 02/27/16  7:39 AM  Result Value Ref Range   Glucose-Capillary 87 65 - 99 mg/dL   Comment 1 Notify RN    No components found for: ESR, C REACTIVE PROTEIN MICRO: Recent Results (from the past 720 hour(s))  Blood Culture (routine x 2)     Status: None (Preliminary result)   Collection Time: 02/25/16  4:24 AM  Result Value Ref Range Status   Specimen Description BLOOD  R AC  Final   Special Requests   Final    BOTTLES DRAWN AEROBIC AND ANAEROBIC  AER 11 ML ANA 11 ML   Culture NO GROWTH 2 DAYS  Final   Report Status PENDING  Incomplete  Blood Culture (routine x 2)     Status: None (Preliminary result)  Collection Time: 02/25/16  4:24 AM  Result Value Ref Range Status   Specimen Description BLOOD  R AC  Final   Special Requests   Final    BOTTLES DRAWN AEROBIC AND ANAEROBIC  AER 8 ML ANA 9 ML   Culture NO GROWTH 2 DAYS  Final   Report Status PENDING  Incomplete  Urine culture     Status: Abnormal   Collection Time: 02/25/16  4:24 AM  Result Value Ref Range Status   Specimen Description URINE, RANDOM  Final   Special Requests NONE  Final   Culture (A)  Final    >=100,000 COLONIES/mL LACTOBACILLUS SPECIES Standardized susceptibility testing for this organism is not available. Performed at [facility], [place] [address], [place], [place] [address]    Report Status 02/26/2016 FINAL  Final  MRSA PCR Screening     Status: None   Collection Time: 02/25/16 11:54 AM  Result Value Ref Range Status   MRSA by PCR NEGATIVE NEGATIVE Final    Comment:        The GeneXpert MRSA Assay (FDA approved for NASAL specimens only), is one component of a comprehensive MRSA colonization surveillance program. It is not intended to diagnose MRSA infection nor to guide or monitor treatment  for MRSA infections.     IMAGING: Ct Head Wo Contrast  Result Date: 02/25/2016 CLINICAL DATA:  Decreased level of consciousness.  Lethargy. EXAM: CT HEAD WITHOUT CONTRAST TECHNIQUE: Contiguous axial images were obtained from the base of the skull through the vertex without intravenous contrast. COMPARISON:  Most recent head CT 10/31/2015 FINDINGS: Brain: No intracranial hemorrhage. Chronic encephalomalacia involving both frontal lobes, right parietal lobe, and right occipital lobe, unchanged. Moderate chronic small vessel ischemia elsewhere. Stable atrophy. No subdural or extra-axial fluid collection. Vascular: No hyperdense vessel or unexpected calcification. Skull: Normal. Negative for fracture or focal lesion. Sinuses/Orbits: Opacification of lower left mastoid air cells is chronic. No paranasal sinus inflammation. Other: None. IMPRESSION: 1.  No acute intracranial abnormality. 2. Stable multifocal encephalomalacia, atrophy, and chronic small vessel ischemia. Electronically Signed   By: Jeb Levering M.D.   On: 02/25/2016 05:26   Dg Chest Portable 1 View  Result Date: 02/25/2016 CLINICAL DATA:  Weakness and shortness of breath. EXAM: PORTABLE CHEST 1 VIEW COMPARISON:  10/31/2015 FINDINGS: Patchy opacity at the right lung base suspicious for pneumonia. Minimal left basilar atelectasis. Unchanged heart size and mediastinal contours. There is atherosclerosis of the thoracic aorta. No pulmonary edema or pleural fluid. No pneumothorax. Unchanged osseous structures. Surgical hardware in the lower cervical spine is partially included. IMPRESSION: Patchy right lung base opacity suspicious for pneumonia. Thoracic aortic atherosclerosis. Electronically Signed   By: Jeb Levering M.D.   On: 02/25/2016 05:27    Assessment:   CALYSSA ZOBRIST is a 66 y.o. female admitted with fevers and possible PNA on cxr following a possible seizure event.  On admit wbc 3, T 102.8, UA TNTC WBC, Flu PCR neg, bcx neg.  UCX with lactobacillus. I suspect she has Aspiration PNA. Lactobaccillus is vaginal flora and rarely causes UTI but she did have TNTC WBC.  She has multiple abx allergies. Today is day 4 of IV azteonam and vanco.   Recommendations For PNA would change to doxy and flagyl for another 3 days- 7 days total Repeat ua and UCX with IN and Out cath Thank you very much for allowing me to participate in the care of this patient. Please call with questions.  Cheral Marker. Ola Spurr, MD

## 2016-02-27 NOTE — Plan of Care (Signed)
Problem: SLP Dysphagia Goals Goal: Misc Dysphagia Goal Pt will safely tolerate po diet of least restrictive consistency w/ no overt s/s of aspiration noted by Staff/pt/family x3 sessions.    

## 2016-02-27 NOTE — Care Management Important Message (Signed)
Important Message  Patient Details  Name: Woodroe ModeCindy S Werntz MRN: [id number] Date of Birth: 07-27-1950   Medicare Important Message Given:  Yes    Chapman FitchBOWEN, Tywon Niday T, RN 02/27/2016, 3:53 PM

## 2016-02-27 NOTE — Progress Notes (Signed)
[facility] [place] [facility]   PATIENT NAME: Tammy SiasCindy Shepard    MR#:  [id number]  DATE OF BIRTH:  1950/11/02  SUBJECTIVE:  CHIEF COMPLAINT:  Pt is More awake and alert today. Answering questions appropriately  ch left sided weakness from old CVA  REVIEW OF SYSTEMS:  CONSTITUTIONAL: No fever, fatigue . Reporting weakness.  EYES: No blurred or double vision.  EARS, NOSE, AND THROAT: No tinnitus or ear pain.  RESPIRATORY: No cough, shortness of breath, wheezing or hemoptysis.  CARDIOVASCULAR: No chest pain, orthopnea, edema.  GASTROINTESTINAL: No nausea, vomiting, diarrhea or abdominal pain.  GENITOURINARY: No dysuria, hematuria.  ENDOCRINE: No polyuria, nocturia,  HEMATOLOGY: No anemia, easy bruising or bleeding SKIN: No rash or lesion. MUSCULOSKELETAL: No joint pain or arthritis.   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    DRUG ALLERGIES:   Allergies  Allergen Reactions  . Amoxicillin Rash    Tolerating rocephin 10/29/15  . Ciprofloxacin Anaphylaxis  . Clindamycin Hcl Swelling, Rash and Other (See Comments)    Other Reaction: BAD RASH AND SWELLING, SKIN BR Severe rash and swelling. Skin break down.  Marland Kitchen. Penicillins Anaphylaxis    Tolerating rocephin 11/01/15  . Codeine Nausea And Vomiting  . Baclofen Itching and Rash  . Contrast Media [Iodinated Diagnostic Agents] Itching and Rash  . Erythromycin Rash  . Iodides Rash    Povidone iodine; betadine  . Latex Itching and Rash  . Nickel Rash and Other (See Comments)    OTHER REACTION  . Soap Itching and Rash  . Sulfa Antibiotics Rash  . Tape Rash    (silk tape only) skin break down  . Tolterodine Rash  . Vancomycin Rash    VITALS:  Blood pressure (!) 123/55, pulse 93, temperature 98.5 F (36.9 C), temperature source Oral, resp. rate 18, height 5\' 3"  (1.6 m), weight 76.8 kg (169 lb 6.4 oz), SpO2 94 %.  PHYSICAL  EXAMINATION:  GENERAL:  66 y.o.-year-old patient lying in the bed with no acute distress.  EYES: Pupils equal, round, reactive to light and accommodation. No scleral icterus.  HEENT: Head atraumatic, normocephalic. Oropharynx and nasopharynx clear.  NECK:  Supple, no jugular venous distention. No thyroid enlargement, no tenderness.  LUNGS: Diminished breath sounds bilaterally,with gurgly sounds no wheezing,  Positive rales,rhonchi/ crepitation. No use of accessory muscles of respiration.  CARDIOVASCULAR: S1, S2 normal. No murmurs, rubs, or gallops.  ABDOMEN: Soft, nontender, nondistended. Bowel sounds present. No organomegaly or mass.  EXTREMITIES: No pedal edema, cyanosis, or clubbing.  NEUROLOGIC: pt with Awake and alert and oriented 3.   chronic left-sided weakness with contractures from old stroke  gait not checked.  PSYCHIATRIC: The patient is alert and oriented 3  SKIN: No obvious rash, lesion, or ulcer.    LABORATORY PANEL:   CBC  Recent Labs Lab 02/27/16 0536  WBC 11.7*  HGB 9.4*  HCT 27.6*  PLT 128*   ------------------------------------------------------------------------------------------------------------------  Chemistries   Recent Labs Lab 02/25/16 0424 02/26/16 0404 02/27/16 0536  NA 144 145  --   K 4.1 4.1  --   CL 109 117*  --   CO2 25 25  --   GLUCOSE 186* 98  --   BUN 22* 29*  --   CREATININE 1.01* 0.88 0.72  CALCIUM 9.3 7.8*  --   AST 21  --   --   ALT 13*  --   --   ALKPHOS 42  --   --  BILITOT 0.8  --   --    ------------------------------------------------------------------------------------------------------------------  Cardiac Enzymes No results for input(s): TROPONINI in the last 168 hours. ------------------------------------------------------------------------------------------------------------------  RADIOLOGY:  No results found.  EKG:   Orders placed or performed during the hospital encounter of 02/25/16  . ED EKG 12-Lead   . ED EKG 12-Lead  . EKG 12-Lead  . EKG 12-Lead  . ED EKG 12-Lead  . ED EKG 12-Lead    ASSESSMENT AND PLAN:   This is a 66 year old female admitted for fever , dx with sepsis  #. Sepsis: The patient meets criteria via fever, tachypnea and leukopenia.  Lactic acid is  4.0 --2.7.  Aggressively hydrate with intravenous fluid.  continue  aztreonam and MRSA Screen negative discontinue  vancomycin (unclear reaction; observe for allergy response).  Patient is allergic to penicillin anaphylaxis  Follow blood cultures negative so far  Urine culture with lactobacillus greater than 100,000 colonies!!!! ID consult-he recommending to discharge patient with doxycycline and Flagyl at the time of discharge for another 3 days total 7 days   repeat urine culture and sensitivity with in and out Is recommended by infectious disease  #. Pneumonia: Healthcare associated.  Supplemental oxygen as needed.  Iv Antibiotics with aztreonam and discontinue vancomycin. Discharge patient home with the by mouth doxycycline and Flagyl for 3 more days  ST evaluated pt as there is a concern for aspiration - recommends puree diet with nectar thick liquids  #Acute anemia hemoglobin dropped by 4 points-could be from hemoconcentration to hemodilution Hemoglobin 13.7--9.6 Discontinue aspirin and Mobic for now  Check CBC in a.m. Stool for occult blood  3. Essential hypertension:pt is hypotensive   Hold patient's ARB, clonidine and amlodipine  4. Diabetes mellitus type : Hold oral hypoglycemic agents. Sliding sole insulin while hospitalized  5. Hyperlipidemia: Continue statin therapy  6. Acute kidney injury: Mild; avoid nephrotoxic agents including NSAIDs (hold meloxicam)  7. Seizure disorder: Continue Depakote  8. Hypothyroidism: Continue Synthroid. Check TSH  9. Mood disorder: Continue Abilify and Prozac when pt is awake and alert  H/o CVA with left sided weakness and contractures Hold trazodone and  neurontin as pt is delerious  10. DVT prophylaxis: Lovenox 11. GI prophylaxis: None     All the records are reviewed and case discussed with Care Management/Social Workerr. Management plans discussed with the patient, family and they are in agreement.  CODE STATUS: FC  TOTAL TIME TAKING CARE OF THIS PATIENT: 36  minutes.   POSSIBLE D/C IN 2-3  DAYS, DEPENDING ON CLINICAL CONDITION.  Note: This dictation was prepared with Dragon dictation along with smaller phrase technology. Any transcriptional errors that result from this process are unintentional.   Ramonita Lab M.D on 02/27/2016 at 5:58 PM  Between 7am to 6pm - Pager - [phone] After 6pm go to [email or web address] - password [email or web address] [facility]  [place]  Office  [phone]  CC: Primary care physician; Laurence Aly, MD

## 2016-02-27 NOTE — Progress Notes (Signed)
Pharmacy Antibiotic Note  Tammy Shepard is a 66 y.o. female admitted on 02/25/2016 with pneumonia.  Pharmacy has been consulted for aztreonam and vancomycin dosing.  Vancomycin discontinued 1/18  Plan: Continue Aztreonam 2 gm IV Q8H  Multiple allergies to antibiotics.   Height: 5\' 3"  (160 cm) Weight: 169 lb 6.4 oz (76.8 kg) IBW/kg (Calculated) : 52.4  Temp (24hrs), Avg:99.2 F (37.3 C), Min:98.5 F (36.9 C), Max:99.9 F (37.7 C)   Recent Labs Lab 02/25/16 0424 02/25/16 0725 02/26/16 0404 02/27/16 0536  WBC 3.4*  --  11.5* 11.7*  CREATININE 1.01*  --  0.88 0.72  LATICACIDVEN 4.0* 2.7*  --   --     Estimated Creatinine Clearance: 69.8 mL/min (by C-G formula based on SCr of 0.72 mg/dL).    Allergies  Allergen Reactions  . Amoxicillin Rash    Tolerating rocephin 10/29/15  . Ciprofloxacin Anaphylaxis  . Clindamycin Hcl Swelling, Rash and Other (See Comments)    Other Reaction: BAD RASH AND SWELLING, SKIN BR Severe rash and swelling. Skin break down.  Marland Kitchen. Penicillins Anaphylaxis    Tolerating rocephin 11/01/15  . Codeine Nausea And Vomiting  . Baclofen Itching and Rash  . Contrast Media [Iodinated Diagnostic Agents] Itching and Rash  . Erythromycin Rash  . Iodides Rash    Povidone iodine; betadine  . Latex Itching and Rash  . Nickel Rash and Other (See Comments)    OTHER REACTION  . Soap Itching and Rash  . Sulfa Antibiotics Rash  . Tape Rash    (silk tape only) skin break down  . Tolterodine Rash  . Vancomycin Rash   Thank you for allowing pharmacy to be a part of this patient's care.  Lynnel Zanetti A, Pharm.D., BCPS Clinical Pharmacist 02/27/2016 3:06 PM

## 2016-02-28 LAB — BASIC METABOLIC PANEL
Anion gap: 7 (ref 5–15)
BUN: 11 mg/dL (ref 6–20)
CHLORIDE: 107 mmol/L (ref 101–111)
CO2: 25 mmol/L (ref 22–32)
CREATININE: 0.63 mg/dL (ref 0.44–1.00)
Calcium: 8.1 mg/dL — ABNORMAL LOW (ref 8.9–10.3)
Glucose, Bld: 75 mg/dL (ref 65–99)
Potassium: 3.1 mmol/L — ABNORMAL LOW (ref 3.5–5.1)
SODIUM: 139 mmol/L (ref 135–145)

## 2016-02-28 LAB — CBC
HCT: 29.3 % — ABNORMAL LOW (ref 35.0–47.0)
HEMOGLOBIN: 10.3 g/dL — AB (ref 12.0–16.0)
MCH: 34.4 pg — AB (ref 26.0–34.0)
MCHC: 35.3 g/dL (ref 32.0–36.0)
MCV: 97.5 fL (ref 80.0–100.0)
PLATELETS: 145 10*3/uL — AB (ref 150–440)
RBC: 3 MIL/uL — AB (ref 3.80–5.20)
RDW: 16.3 % — ABNORMAL HIGH (ref 11.5–14.5)
WBC: 9 10*3/uL (ref 3.6–11.0)

## 2016-02-28 LAB — GLUCOSE, CAPILLARY
Glucose-Capillary: 80 mg/dL (ref 65–99)
Glucose-Capillary: 116 mg/dL — ABNORMAL HIGH (ref 65–99)

## 2016-02-28 LAB — URINE CULTURE: CULTURE: NO GROWTH

## 2016-02-28 MED ORDER — DIVALPROEX SODIUM 125 MG PO CSDR: 750.0000 mg | DELAYED_RELEASE_CAPSULE | Freq: Every day | ORAL | 0 refills | Status: DC

## 2016-02-28 MED ORDER — DIVALPROEX SODIUM 125 MG PO CSDR: 500.0000 mg | DELAYED_RELEASE_CAPSULE | Freq: Every day | ORAL | 0 refills | Status: DC

## 2016-02-28 MED ORDER — AMLODIPINE BESYLATE 10 MG PO TABS
10.0000 mg | ORAL_TABLET | Freq: Every day | ORAL | Status: DC
  Administered 2016-02-28: 10 mg via ORAL
  Filled 2016-02-28: qty 1

## 2016-02-28 MED ORDER — POTASSIUM CHLORIDE CRYS ER 20 MEQ PO TBCR
40.0000 meq | EXTENDED_RELEASE_TABLET | Freq: Once | ORAL | Status: AC
  Administered 2016-02-28: 40 meq via ORAL
  Filled 2016-02-28: qty 2

## 2016-02-28 MED ORDER — CLONAZEPAM 0.5 MG PO TABS: 0.5000 mg | ORAL_TABLET | Freq: Two times a day (BID) | ORAL | 0 refills | Status: DC | PRN

## 2016-02-28 MED ORDER — METRONIDAZOLE 500 MG PO TABS: 500.0000 mg | ORAL_TABLET | Freq: Two times a day (BID) | ORAL | 0 refills | Status: AC

## 2016-02-28 MED ORDER — DOXYCYCLINE HYCLATE 100 MG PO TABS: 100.0000 mg | ORAL_TABLET | Freq: Two times a day (BID) | ORAL | 0 refills | Status: AC

## 2016-02-28 MED ORDER — OXYCODONE HCL ER 10 MG PO T12A: 10.0000 mg | EXTENDED_RELEASE_TABLET | Freq: Two times a day (BID) | ORAL | 0 refills | Status: DC

## 2016-02-28 MED ORDER — ASPIRIN EC 81 MG PO TBEC
81.0000 mg | DELAYED_RELEASE_TABLET | Freq: Every day | ORAL | Status: DC
  Administered 2016-02-28: 81 mg via ORAL
  Filled 2016-02-28: qty 1

## 2016-02-28 MED ORDER — OXYCODONE HCL 5 MG PO TABS: 5.0000 mg | ORAL_TABLET | Freq: Every day | ORAL | 0 refills | Status: DC | PRN

## 2016-02-28 NOTE — Discharge Summary (Addendum)
[facility] - [place] at [facility]   PATIENT NAME: Tammy SiasCindy Shepard    MR#:  [id number]  DATE OF BIRTH:  1950/11/26  DATE OF ADMISSION:  02/25/2016 ADMITTING PHYSICIAN: Arnaldo NatalMichael S Diamond, MD  DATE OF DISCHARGE: 02/28/2016  PRIMARY CARE PHYSICIAN: Laurence AlyGAY, LAURA C, MD    ADMISSION DIAGNOSIS:  Hypoxia [R09.02] HCAP (healthcare-associated pneumonia) [J18.9] Sepsis, due to unspecified organism (HCC) [A41.9] Fever, unspecified fever cause [R50.9]  DISCHARGE DIAGNOSIS:  Active Problems:   Sepsis (HCC)   SECONDARY DIAGNOSIS:   Past Medical History:  Diagnosis Date  . Asthma   . CVA (cerebrovascular accident due to intracerebral hemorrhage) (HCC)   . Diabetes mellitus without complication (HCC)   . Hyperlipemia   . Hypertension   . Seizures (HCC)   . 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  1. Sepsis due to pneumonia: Due to multiple allergies she is on doxycycline and Flagyl for 2 more days as per ID Recommendations.  Urine culture growing lactobacillus which was not treated. Repeat urine culture not showing growth.  Blood cultures are negative  2.HCAP: Plan as outlined above   3. Anemia of chronic disease with stable hemoglobin  4. Essential hypertension: Blood pressures have improved and patient will need to be restarted on outpatient medications  5. History of seizures: Continue Depakote  6. Hypothyroidism: Continue Synthroid  7. Diabetes: Continue sliding scale insulin   8. History of CVA Continue atorvastatin and ASA  Management plans discussed with the patient and she is in agreement. 9. Sacral stage -1: turn patient every 2 hours.  [facility]   DISCHARGE CONDITIONS AND DIET:   Stable for discharge  Pt will safely tolerate po diet of least restrictive consistency w/ no overt s/s of aspiration  noted by Staff/pt/family x3 sessions.   Dysphagia 1 (puree);Thin liquid Liquids provided via: Cup;Straw (monitor straw drinking) Medication Administration: Whole meds with puree (crushed if needed for easier swallowing and if able) Supervision: Staff to assist with self feeding;Full supervision/cueing for compensatory strategies Compensations: Minimize environmental distractions;Slow rate;Small sips/bites;Lingual sweep for clearance of pocketing;Follow solids with liquid Postural Changes and/or Swallow Maneuvers: Seated upright 90 degrees;Upright 30-60 min after meal           CONSULTS OBTAINED:  Treatment Team:  Mick Sellavid P Fitzgerald, MD Pauletta BrownsYuriy Zeylikman, MD  DRUG ALLERGIES:   Allergies  Allergen Reactions  . Amoxicillin Rash    Tolerating rocephin 10/29/15  . Ciprofloxacin Anaphylaxis  . Clindamycin Hcl Swelling, Rash and Other (See Comments)    Other Reaction: BAD RASH AND SWELLING, SKIN BR Severe rash and swelling. Skin break down.  Marland Kitchen. Penicillins Anaphylaxis    Tolerating rocephin 11/01/15  . Codeine Nausea And Vomiting  . Baclofen Itching and Rash  . Contrast Media [Iodinated Diagnostic Agents] Itching and Rash  . Erythromycin Rash  . Iodides Rash    Povidone iodine; betadine  . Latex Itching and Rash  . Nickel Rash and Other (See Comments)    OTHER REACTION  . Soap Itching and Rash  . Sulfa Antibiotics Rash  . Tape Rash    (silk tape only) skin break down  . Tolterodine Rash  . Vancomycin Rash    DISCHARGE MEDICATIONS:   Current Discharge Medication List    START taking these medications   Details  doxycycline (VIBRA-TABS) 100 MG tablet Take 1 tablet (  100 mg total) by mouth every 12 (twelve) hours. Qty: 4 tablet, Refills: 0    metroNIDAZOLE (FLAGYL) 500 MG tablet Take 1 tablet (500 mg total) by mouth every 12 (twelve) hours. Qty: 4 tablet, Refills: 0      CONTINUE these medications which have CHANGED   Details  clonazePAM (KLONOPIN) 0.5 MG tablet  Take 1 tablet (0.5 mg total) by mouth 2 (two) times daily as needed for anxiety. Qty: 10 tablet, Refills: 0    !! divalproex (DEPAKOTE SPRINKLE) 125 MG capsule Take 4 capsules (500 mg total) by mouth daily. Qty: 20 capsule, Refills: 0    !! divalproex (DEPAKOTE SPRINKLE) 125 MG capsule Take 6 capsules (750 mg total) by mouth daily at 10 pm. Qty: 20 capsule, Refills: 0    oxyCODONE (OXY IR/ROXICODONE) 5 MG immediate release tablet Take 1 tablet (5 mg total) by mouth daily as needed for moderate pain or severe pain. Qty: 10 tablet, Refills: 0    oxyCODONE (OXYCONTIN) 10 mg 12 hr tablet Take 1 tablet (10 mg total) by mouth every 12 (twelve) hours. Qty: 10 tablet, Refills: 0     !! - Potential duplicate medications found. Please discuss with provider.    CONTINUE these medications which have NOT CHANGED   Details  albuterol (PROVENTIL HFA;VENTOLIN HFA) 108 (90 Base) MCG/ACT inhaler Inhale 2 puffs into the lungs daily as needed for shortness of breath.    albuterol (PROVENTIL) (2.5 MG/3ML) 0.083% nebulizer solution Take 2.5 mg by nebulization every 8 (eight) hours as needed for shortness of breath.    amLODipine (NORVASC) 10 MG tablet Take 10 mg by mouth daily.    ARIPiprazole (ABILIFY) 10 MG tablet Take 10 mg by mouth daily.    aspirin EC 81 MG tablet Take 81 mg by mouth daily.    atorvastatin (LIPITOR) 40 MG tablet Take 40 mg by mouth every morning.    cloNIDine (CATAPRES) 0.3 MG tablet Take 0.3 mg by mouth 2 (two) times daily.    Cyanocobalamin (RA VITAMIN B-12 TR) 1000 MCG TBCR Take 1,000 mcg by mouth daily.    FLUoxetine (PROZAC) 40 MG capsule Take 40 mg by mouth daily.    levothyroxine (SYNTHROID, LEVOTHROID) 75 MCG tablet Take 75 mcg by mouth daily before breakfast.    losartan (COZAAR) 50 MG tablet Take 50 mg by mouth 2 (two) times daily. Refills: 0    metFORMIN (GLUCOPHAGE) 500 MG tablet Take 500 mg by mouth 2 (two) times daily.    omeprazole (PRILOSEC) 20 MG capsule  Take 20 mg by mouth daily. Refills: 1    oxybutynin (DITROPAN) 5 MG tablet Take 10 mg by mouth 2 (two) times daily.    polyethylene glycol (MIRALAX / GLYCOLAX) packet Take 17 g by mouth daily.    senna (SENOKOT) 8.6 MG tablet Take 2 tablets by mouth 2 (two) times daily as needed for constipation.      STOP taking these medications     cyclobenzaprine (FLEXERIL) 5 MG tablet      gabapentin (NEURONTIN) 300 MG capsule      lidocaine (XYLOCAINE) 5 % ointment      meloxicam (MOBIC) 15 MG tablet      ondansetron (ZOFRAN) 4 MG tablet      traZODone (DESYREL) 100 MG tablet      trimethoprim (TRIMPEX) 100 MG tablet               Today   CHIEF COMPLAINT:  No issues overnight   VITAL SIGNS:  Blood pressure (!) 162/77, pulse 94, temperature 99.9 F (37.7 C), temperature source Oral, resp. rate 18, height 5\' 3"  (1.6 m), weight 74.3 kg (163 lb 11.2 oz), SpO2 95 %.   REVIEW OF SYSTEMS:  Review of Systems  Constitutional: Negative.  Negative for chills, fever and malaise/fatigue.  HENT: Negative.  Negative for ear discharge, ear pain, hearing loss, nosebleeds and sore throat.   Eyes: Negative.  Negative for blurred vision and pain.  Respiratory: Negative.  Negative for cough, hemoptysis, shortness of breath and wheezing.   Cardiovascular: Negative.  Negative for chest pain, palpitations and leg swelling.  Gastrointestinal: Negative.  Negative for abdominal pain, blood in stool, diarrhea, nausea and vomiting.  Genitourinary: Negative.  Negative for dysuria.  Musculoskeletal: Negative.  Negative for back pain.  Skin:       Minimal change on sacrum  Neurological: Positive for focal weakness. Negative for dizziness, tremors, speech change, seizures and headaches.  Endo/Heme/Allergies: Negative.  Does not bruise/bleed easily.  Psychiatric/Behavioral: Negative.  Negative for depression, hallucinations and suicidal ideas.     PHYSICAL EXAMINATION:  GENERAL:  66  y.o.-year-old patient lying in the bed with no acute distress.  NECK:  Supple, no jugular venous distention. No thyroid enlargement, no tenderness.  LUNGS: Normal breath sounds bilaterally, no wheezing, rales,rhonchi  No use of accessory muscles of respiration.  CARDIOVASCULAR: S1, S2 normal. No murmurs, rubs, or gallops.  ABDOMEN: Soft, non-tender, non-distended. Bowel sounds present. No organomegaly or mass.  EXTREMITIES: No pedal edema, cyanosis, or clubbing. + LUE contractures PSYCHIATRIC: The patient is alert and oriented x 3.  SKIN: sacral 1 POA  NEURO: LUE 0/5 and LLE0/5 contractured left hand DATA REVIEW:   CBC  Recent Labs Lab 02/28/16 0602  WBC 9.0  HGB 10.3*  HCT 29.3*  PLT 145*    Chemistries   Recent Labs Lab 02/25/16 0424  02/28/16 0602  NA 144  < > 139  K 4.1  < > 3.1*  CL 109  < > 107  CO2 25  < > 25  GLUCOSE 186*  < > 75  BUN 22*  < > 11  CREATININE 1.01*  < > 0.63  CALCIUM 9.3  < > 8.1*  AST 21  --   --   ALT 13*  --   --   ALKPHOS 42  --   --   BILITOT 0.8  --   --   < > = values in this interval not displayed.  Cardiac Enzymes No results for input(s): TROPONINI in the last 168 hours.  Microbiology Results  @[email or web address] @  RADIOLOGY:  No results found.    Management plans discussed with the patient and she is in agreement. Stable for discharge   Patient should follow up with pcp  CODE STATUS:     Code Status Orders        Start     Ordered   02/25/16 0901  Full code  Continuous     02/25/16 0900    Code Status History    Date Active Date Inactive Code Status Order ID Comments User Context   10/31/2015 11:55 PM 11/07/2015  2:39 PM Full Code [id number]  Hillary Bow, DO ED    Advance Directive Documentation   Flowsheet Row Most Recent Value  Type of Advance Directive  Healthcare Power of Attorney  Pre-existing out of facility DNR order (yellow form or pink MOST form)  No data  "MOST" Form in Place?  No data  TOTAL  TIME TAKING CARE OF THIS PATIENT: 38 minutes.    Note: This dictation was prepared with Dragon dictation along with smaller phrase technology. Any transcriptional errors that result from this process are unintentional.  Tinleigh Whitmire M.D on 02/28/2016 at 10:51 AM  Between 7am to 6pm - Pager - [phone] After 6pm go to [email or web address] - Social research officer, government  [facility]  Office  [phone]  CC: Primary care physician; Laurence Aly, MD

## 2016-02-28 NOTE — Progress Notes (Signed)
Called report to [facility] [phone] spoke to [facility] Memphiseresa RN

## 2016-02-28 NOTE — Clinical Social Work Note (Signed)
Patient will dc [facility] via non emergent EMS. The patient's daughter is aware an in agreement. The facility is aware and has all paperwork. [facility]. CSW will con't to follow pending additional dc needs.  Argentina PonderKaren Martha Shalisa Mcquade, MSW, Theresia MajorsLCSWA [phone]

## 2016-02-28 NOTE — Progress Notes (Signed)
Chart reviewed. Pt visited. No temp spikes. Pt is currently tolerating diet. Pt yelling for something to drink. Given sips by straw with no s/s of aspiration. Vocal quality remained clear. Pt has dentures in but refused to try solids with ST. Pt reported she likes the pureed foods and wants to continue receiving pureed consistency. No further ST f/u at this time. Stop use of straw if any coughing noted. Aspiration precautions in place. Dysphagia 1 diet at discharge

## 2016-02-28 NOTE — Progress Notes (Signed)
[facility] - [place] at [facility]   PATIENT NAME: Tammy SiasCindy Shupert    MR#:  [id number]  DATE OF BIRTH:  1950-04-14  SUBJECTIVE:   Patient is back to baseline. No acute events overnight. She does not want to return back to [facility].  REVIEW OF SYSTEMS:    Review of Systems  Constitutional: Negative.  Negative for chills, fever and malaise/fatigue.  HENT: Negative.  Negative for ear discharge, ear pain, hearing loss, nosebleeds and sore throat.   Eyes: Negative.  Negative for blurred vision and pain.  Respiratory: Negative.  Negative for cough, hemoptysis, shortness of breath and wheezing.   Cardiovascular: Negative.  Negative for chest pain, palpitations and leg swelling.  Gastrointestinal: Negative.  Negative for abdominal pain, blood in stool, diarrhea, nausea and vomiting.  Genitourinary: Negative.  Negative for dysuria.  Musculoskeletal: Negative.  Negative for back pain.  Skin:       Stage 1 POA  Neurological: Positive for focal weakness (previous cva). Negative for dizziness, tremors, speech change, seizures and headaches.  Endo/Heme/Allergies: Negative.  Does not bruise/bleed easily.  Psychiatric/Behavioral: Negative.  Negative for depression, hallucinations and suicidal ideas.    Tolerating Diet: yes      DRUG ALLERGIES:   Allergies  Allergen Reactions  . Amoxicillin Rash    Tolerating rocephin 10/29/15  . Ciprofloxacin Anaphylaxis  . Clindamycin Hcl Swelling, Rash and Other (See Comments)    Other Reaction: BAD RASH AND SWELLING, SKIN BR Severe rash and swelling. Skin break down.  Marland Kitchen. Penicillins Anaphylaxis    Tolerating rocephin 11/01/15  . Codeine Nausea And Vomiting  . Baclofen Itching and Rash  . Contrast Media [Iodinated Diagnostic Agents] Itching and Rash  . Erythromycin Rash  . Iodides Rash    Povidone iodine; betadine  . Latex Itching and Rash  . Nickel Rash and Other (See Comments)    OTHER REACTION  . Soap Itching and  Rash  . Sulfa Antibiotics Rash  . Tape Rash    (silk tape only) skin break down  . Tolterodine Rash  . Vancomycin Rash    VITALS:  Blood pressure (!) 162/77, pulse 94, temperature 99.9 F (37.7 C), temperature source Oral, resp. rate 18, height 5\' 3"  (1.6 m), weight 74.3 kg (163 lb 11.2 oz), SpO2 95 %.  PHYSICAL EXAMINATION:   Physical Exam  Constitutional: She is oriented to person, place, and time and well-developed, well-nourished, and in no distress. No distress.  HENT:  Head: Normocephalic.  Eyes: No scleral icterus.  Neck: Normal range of motion. Neck supple. No JVD present. No tracheal deviation present.  Cardiovascular: Normal rate, regular rhythm and normal heart sounds.  Exam reveals no gallop and no friction rub.   No murmur heard. Pulmonary/Chest: Effort normal and breath sounds normal. No respiratory distress. She has no wheezes. She has no rales. She exhibits no tenderness.  Abdominal: Soft. Bowel sounds are normal. She exhibits no distension and no mass. There is no tenderness. There is no rebound and no guarding.  Musculoskeletal: Normal range of motion. She exhibits no edema.  Neurological: She is alert and oriented to person, place, and time.  Left UE and LEE 1/5 from previous CVA  Skin: Skin is warm. No rash noted. No erythema.  Stage 1 sacral POA  Psychiatric: Affect and judgment normal.      LABORATORY PANEL:   CBC  Recent Labs Lab 02/28/16 0602  WBC 9.0  HGB 10.3*  HCT 29.3*  PLT 145*   ------------------------------------------------------------------------------------------------------------------  Chemistries   Recent Labs Lab 02/25/16 0424  02/28/16 0602  NA 144  < > 139  K 4.1  < > 3.1*  CL 109  < > 107  CO2 25  < > 25  GLUCOSE 186*  < > 75  BUN 22*  < > 11  CREATININE 1.01*  < > 0.63  CALCIUM 9.3  < > 8.1*  AST 21  --   --   ALT 13*  --   --   ALKPHOS 42  --   --   BILITOT 0.8  --   --   < > = values in this interval not  displayed. ------------------------------------------------------------------------------------------------------------------  Cardiac Enzymes No results for input(s): TROPONINI in the last 168 hours. ------------------------------------------------------------------------------------------------------------------  RADIOLOGY:  No results found.   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  1. Sepsis due to pneumonia: Due to multiple allergies she is on doxycycline and Flagyl for 2 more days. ID consult appreciated. Urine culture growing lactobacillus which was not treated. Repeat urine culture pending. The cultures are negative 2.HCAP: Plan as outlined above   3. Anemia of chronic disease with stable hemoglobin  4. Essential hypertension: Blood pressures have improved and patient will need to be restarted on outpatient medications  5. History of seizures: Continue Depakote  6. Hypothyroidism: Continue Synthroid  7. Diabetes: Continue sliding scale insulin   8. History of CVA Continue atorvastatin and ASA  Management plans discussed with the patient and she is in agreement.  CODE STATUS: FULL  TOTAL TIME TAKING CARE OF THIS PATIENT: 25 minutes.    Patient medically stable for discharge. POSSIBLE D/C today, DEPENDING ON CLINICAL CONDITION.   Josia Cueva M.D on 02/28/2016 at 10:38 AM  Between 7am to 6pm - Pager - [phone] After 6pm go to [email or web address] - password [email or web address]  [facility]  Office  [phone]  CC: Primary care physician; Laurence Aly, MD  Note: This dictation was prepared with Dragon dictation along with smaller phrase technology. Any transcriptional errors that result from this process are unintentional.

## 2016-03-01 LAB — CULTURE, BLOOD (ROUTINE X 2)
CULTURE: NO GROWTH
CULTURE: NO GROWTH

## 2016-04-21 DIAGNOSIS — E118 Type 2 diabetes mellitus with unspecified complications: Secondary | ICD-10-CM | POA: Diagnosis present

## 2016-08-28 ENCOUNTER — Observation Stay
Admission: EM | Admit: 2016-08-28 | Discharge: 2016-08-30 | Disposition: A | Discharge status: Skilled Nursing Facility | Payer: Medicare Other | Attending: Internal Medicine | Admitting: Internal Medicine

## 2016-08-28 ENCOUNTER — Emergency Department: Payer: Medicare Other

## 2016-08-28 ENCOUNTER — Encounter: Payer: Self-pay | Admitting: Emergency Medicine

## 2016-08-28 DIAGNOSIS — Z888 Allergy status to other drugs, medicaments and biological substances status: Secondary | ICD-10-CM | POA: Diagnosis not present

## 2016-08-28 DIAGNOSIS — Z881 Allergy status to other antibiotic agents status: Secondary | ICD-10-CM | POA: Diagnosis not present

## 2016-08-28 DIAGNOSIS — G9340 Encephalopathy, unspecified: Secondary | ICD-10-CM

## 2016-08-28 DIAGNOSIS — R112 Nausea with vomiting, unspecified: Secondary | ICD-10-CM | POA: Diagnosis not present

## 2016-08-28 DIAGNOSIS — B9620 Unspecified Escherichia coli [E. coli] as the cause of diseases classified elsewhere: Secondary | ICD-10-CM | POA: Diagnosis not present

## 2016-08-28 DIAGNOSIS — Z87891 Personal history of nicotine dependence: Secondary | ICD-10-CM | POA: Insufficient documentation

## 2016-08-28 DIAGNOSIS — Z885 Allergy status to narcotic agent status: Secondary | ICD-10-CM | POA: Insufficient documentation

## 2016-08-28 DIAGNOSIS — E785 Hyperlipidemia, unspecified: Secondary | ICD-10-CM | POA: Insufficient documentation

## 2016-08-28 DIAGNOSIS — A419 Sepsis, unspecified organism: Secondary | ICD-10-CM | POA: Diagnosis present

## 2016-08-28 DIAGNOSIS — Z882 Allergy status to sulfonamides status: Secondary | ICD-10-CM | POA: Diagnosis not present

## 2016-08-28 DIAGNOSIS — Z91040 Latex allergy status: Secondary | ICD-10-CM | POA: Insufficient documentation

## 2016-08-28 DIAGNOSIS — I700 Atherosclerosis of aorta: Secondary | ICD-10-CM | POA: Insufficient documentation

## 2016-08-28 DIAGNOSIS — Z880 Allergy status to penicillin: Secondary | ICD-10-CM | POA: Diagnosis not present

## 2016-08-28 DIAGNOSIS — I10 Essential (primary) hypertension: Secondary | ICD-10-CM | POA: Insufficient documentation

## 2016-08-28 DIAGNOSIS — E119 Type 2 diabetes mellitus without complications: Secondary | ICD-10-CM | POA: Insufficient documentation

## 2016-08-28 DIAGNOSIS — Z981 Arthrodesis status: Secondary | ICD-10-CM | POA: Insufficient documentation

## 2016-08-28 DIAGNOSIS — I69354 Hemiplegia and hemiparesis following cerebral infarction affecting left non-dominant side: Secondary | ICD-10-CM | POA: Diagnosis not present

## 2016-08-28 DIAGNOSIS — R1031 Right lower quadrant pain: Secondary | ICD-10-CM | POA: Diagnosis not present

## 2016-08-28 DIAGNOSIS — Z79899 Other long term (current) drug therapy: Secondary | ICD-10-CM | POA: Insufficient documentation

## 2016-08-28 DIAGNOSIS — N289 Disorder of kidney and ureter, unspecified: Secondary | ICD-10-CM | POA: Insufficient documentation

## 2016-08-28 DIAGNOSIS — Z7984 Long term (current) use of oral hypoglycemic drugs: Secondary | ICD-10-CM | POA: Insufficient documentation

## 2016-08-28 DIAGNOSIS — Z7982 Long term (current) use of aspirin: Secondary | ICD-10-CM | POA: Insufficient documentation

## 2016-08-28 DIAGNOSIS — N3090 Cystitis, unspecified without hematuria: Secondary | ICD-10-CM

## 2016-08-28 DIAGNOSIS — J45909 Unspecified asthma, uncomplicated: Secondary | ICD-10-CM | POA: Insufficient documentation

## 2016-08-28 DIAGNOSIS — N390 Urinary tract infection, site not specified: Secondary | ICD-10-CM | POA: Diagnosis not present

## 2016-08-28 DIAGNOSIS — Z91041 Radiographic dye allergy status: Secondary | ICD-10-CM | POA: Insufficient documentation

## 2016-08-28 LAB — COMPREHENSIVE METABOLIC PANEL
ALT: 13 U/L — AB (ref 14–54)
AST: 19 U/L (ref 15–41)
Albumin: 3.3 g/dL — ABNORMAL LOW (ref 3.5–5.0)
Alkaline Phosphatase: 80 U/L (ref 38–126)
Anion gap: 11 (ref 5–15)
BUN: 16 mg/dL (ref 6–20)
CHLORIDE: 108 mmol/L (ref 101–111)
CO2: 21 mmol/L — ABNORMAL LOW (ref 22–32)
CREATININE: 1.02 mg/dL — AB (ref 0.44–1.00)
Calcium: 8.7 mg/dL — ABNORMAL LOW (ref 8.9–10.3)
GFR calc Af Amer: >60 mL/min (ref >60)
GFR, EST NON AFRICAN AMERICAN: 57 mL/min — AB (ref >60)
Glucose, Bld: 143 mg/dL — ABNORMAL HIGH (ref 65–99)
Potassium: 3.9 mmol/L (ref 3.5–5.1)
Sodium: 140 mmol/L (ref 135–145)
Total Bilirubin: 0.5 mg/dL (ref 0.3–1.2)
Total Protein: 7.1 g/dL (ref 6.5–8.1)

## 2016-08-28 LAB — CBC WITH DIFFERENTIAL/PLATELET
BASOS ABS: 0 10*3/uL (ref 0–0.1)
BASOS PCT: 0 %
EOS PCT: 1 %
Eosinophils Absolute: 0.1 10*3/uL (ref 0–0.7)
HCT: 34.7 % — ABNORMAL LOW (ref 35.0–47.0)
Hemoglobin: 11.7 g/dL — ABNORMAL LOW (ref 12.0–16.0)
Lymphocytes Relative: 5 %
Lymphs Abs: 0.6 10*3/uL — ABNORMAL LOW (ref 1.0–3.6)
MCH: 30.7 pg (ref 26.0–34.0)
MCHC: 33.8 g/dL (ref 32.0–36.0)
MCV: 90.9 fL (ref 80.0–100.0)
MONO ABS: 0.9 10*3/uL (ref 0.2–0.9)
Monocytes Relative: 8 %
Neutro Abs: 9.3 10*3/uL — ABNORMAL HIGH (ref 1.4–6.5)
Neutrophils Relative %: 86 %
PLATELETS: 245 10*3/uL (ref 150–440)
RBC: 3.82 MIL/uL (ref 3.80–5.20)
RDW: 15.4 % — AB (ref 11.5–14.5)
WBC: 10.9 10*3/uL (ref 3.6–11.0)

## 2016-08-28 LAB — URINALYSIS, COMPLETE (UACMP) WITH MICROSCOPIC
Bilirubin Urine: NEGATIVE
Glucose, UA: NEGATIVE mg/dL
KETONES UR: NEGATIVE mg/dL
Nitrite: POSITIVE — AB
PH: 5 (ref 5.0–8.0)
PROTEIN: 100 mg/dL — AB
Specific Gravity, Urine: 1.014 (ref 1.005–1.030)

## 2016-08-28 LAB — TROPONIN I

## 2016-08-28 LAB — PROCALCITONIN: Procalcitonin: 0.27 ng/mL

## 2016-08-28 LAB — LACTIC ACID, PLASMA
LACTIC ACID, VENOUS: 2.5 mmol/L — AB (ref 0.5–1.9)
LACTIC ACID, VENOUS: 1.9 mmol/L (ref 0.5–1.9)

## 2016-08-28 LAB — LIPASE, BLOOD: LIPASE: 24 U/L (ref 11–51)

## 2016-08-28 MED ORDER — OXYCODONE HCL 5 MG PO TABS
5.0000 mg | ORAL_TABLET | Freq: Once | ORAL | Status: AC
  Administered 2016-08-28: 5 mg via ORAL
  Filled 2016-08-28: qty 1

## 2016-08-28 MED ORDER — SODIUM CHLORIDE 0.9 % IV BOLUS (SEPSIS)
1000.0000 mL | Freq: Once | INTRAVENOUS | Status: AC
  Administered 2016-08-28: 1000 mL via INTRAVENOUS

## 2016-08-28 MED ORDER — METRONIDAZOLE IN NACL 5-0.79 MG/ML-% IV SOLN
500.0000 mg | Freq: Once | INTRAVENOUS | Status: AC
  Administered 2016-08-28: 500 mg via INTRAVENOUS
  Filled 2016-08-28: qty 100

## 2016-08-28 MED ORDER — DEXTROSE 5 % IV SOLN
2.0000 g | Freq: Once | INTRAVENOUS | Status: AC
  Administered 2016-08-28: 2 g via INTRAVENOUS
  Filled 2016-08-28: qty 2

## 2016-08-28 NOTE — ED Provider Notes (Signed)
[facility] Emergency Department Provider Note  ____________________________________________  Time seen: Approximately 10:45 PM  I have reviewed the triage vital signs and the nursing notes.   HISTORY  Chief Complaint Emesis (Pt. here via EMS from [facility] for vomiting and nausea.)  Level 5 caveat:  Portions of the history and physical were unable to be obtained due to: Altered mental status  HPI Tammy Shepard is a 66 y.o. female sent to the ED for evaluation of vomiting. Patient has been reported to have nausea and vomiting for the past 2 days. Patient is not able to recall timeframe but does agree she's been vomiting. Also complains of generalized abdominal pain of unknown duration. Denies any other complaints. Denies falls or trauma. Patient had reported temperature of 100.8 at [employer].    Past Medical History:  Diagnosis Date  . Asthma   . CVA (cerebrovascular accident due to intracerebral hemorrhage) (HCC)   . Diabetes mellitus without complication (HCC)   . Hyperlipemia   . Hypertension   . Seizures (HCC)   . Stroke [facility])      Patient Active Problem List   Diagnosis Date Noted  . Sepsis (HCC) 02/25/2016  . H/O: CVA (cerebrovascular accident) 11/04/2015  . Hypotension 11/04/2015  . Essential hypertension, benign 11/04/2015  . Fever in adult   . Generalized weakness   . 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 Fever 10/31/2015  . UTI (lower urinary tract infection) 10/31/2015     Past Surgical History:  Procedure Laterality Date  . BRAIN SURGERY    . CERVICAL FUSION       Prior to Admission medications   Medication Sig Start Date End Date Taking? Authorizing Provider  albuterol (PROVENTIL HFA;VENTOLIN HFA) 108 (90 Base) MCG/ACT inhaler Inhale 2 puffs into the lungs daily as needed for shortness of breath. 11/04/12   [provider]  albuterol (PROVENTIL) (2.5 MG/3ML) 0.083% nebulizer solution Take 2.5 mg by nebulization  every 8 (eight) hours as needed for shortness of breath. 11/09/13   [provider]  amLODipine (NORVASC) 10 MG tablet Take 10 mg by mouth daily. 07/06/13   [provider]  ARIPiprazole (ABILIFY) 10 MG tablet Take 10 mg by mouth daily.    [provider]  aspirin EC 81 MG tablet Take 81 mg by mouth daily.    [provider]  atorvastatin (LIPITOR) 40 MG tablet Take 40 mg by mouth every morning.    [provider]  clonazePAM (KLONOPIN) 0.5 MG tablet Take 1 tablet (0.5 mg total) by mouth 2 (two) times daily as needed for anxiety. 02/28/16   Adrian Saran, MD  cloNIDine (CATAPRES) 0.3 MG tablet Take 0.3 mg by mouth 2 (two) times daily.    [provider]  Cyanocobalamin (RA VITAMIN B-12 TR) 1000 MCG TBCR Take 1,000 mcg by mouth daily.    [provider]  divalproex (DEPAKOTE SPRINKLE) 125 MG capsule Take 4 capsules (500 mg total) by mouth daily. 02/29/16   Adrian Saran, MD  divalproex (DEPAKOTE SPRINKLE) 125 MG capsule Take 6 capsules (750 mg total) by mouth daily at 10 pm. 02/28/16   Adrian Saran, MD  FLUoxetine (PROZAC) 40 MG capsule Take 40 mg by mouth daily.    [provider]  levothyroxine (SYNTHROID, LEVOTHROID) 75 MCG tablet Take 75 mcg by mouth daily before breakfast. 09/03/13   [provider]  losartan (COZAAR) 50 MG tablet Take 50 mg by mouth 2 (two) times daily. 10/27/15   [provider]  metFORMIN (GLUCOPHAGE) 500 MG tablet Take 500 mg by mouth 2 (two) times daily. 10/16/15   [provider]  omeprazole (PRILOSEC) 20 MG capsule Take 20 mg by mouth daily. 09/18/15   [provider]  oxybutynin (DITROPAN) 5 MG tablet Take 10 mg by mouth 2 (two) times daily. 01/10/14   [provider]  oxyCODONE (OXY IR/ROXICODONE) 5 MG immediate release tablet Take 1 tablet (5 mg total) by mouth daily as needed for moderate pain or severe pain. 02/28/16   Adrian Saran, MD  oxyCODONE (OXYCONTIN) 10 mg 12 hr  tablet Take 1 tablet (10 mg total) by mouth every 12 (twelve) hours. 02/28/16   Adrian Saran, MD  polyethylene glycol (MIRALAX / GLYCOLAX) packet Take 17 g by mouth daily.    [provider]  senna (SENOKOT) 8.6 MG tablet Take 2 tablets by mouth 2 (two) times daily as needed for constipation. 06/05/13   [provider]     Allergies Amoxicillin; Ciprofloxacin; Clindamycin hcl; Penicillins; Codeine; Baclofen; Contrast media [iodinated diagnostic agents]; Erythromycin; Iodides; Latex; Nickel; Soap; Sulfa antibiotics; Tape; Tolterodine; and Vancomycin   History reviewed. No pertinent family history.  Social History Social History  Substance Use Topics  . Smoking status: Former Games developer  . Smokeless tobacco: Never Used  . Alcohol use No    Review of Systems  Constitutional:   No fever Positive chills. Positive night sweats ENT:   No sore throat. No rhinorrhea. Cardiovascular:   No chest pain or syncope. Respiratory:   No dyspnea or cough. Gastrointestinal:   Positive abdominal pain with vomiting. No diarrhea or constipation.  Musculoskeletal:   Negative for focal pain or swelling All other systems reviewed and are negative except as documented above in ROS and HPI.  ____________________________________________   PHYSICAL EXAM:  VITAL SIGNS: ED Triage Vitals  Enc Vitals Group     BP 08/28/16 1931 (!) 112/53     Pulse Rate 08/28/16 1931 81     Resp 08/28/16 1931 16     Temp 08/28/16 1931 100.2 F (37.9 C)     Temp Source 08/28/16 1931 Oral     SpO2 08/28/16 1931 95 %     Weight 08/28/16 1939 145 lb (65.8 kg)     Height 08/28/16 1939 5\' 3"  (1.6 m)     Head Circumference --      Peak Flow --      Pain Score --      Pain Loc --      Pain Edu? --      Excl. in GC? --     Vital signs reviewed, nursing assessments reviewed.   Constitutional:   Alert and orientedTo person and place. Disoriented to time. Ill-appearing but not in distress. Eyes:   No scleral  icterus.  EOMI. No nystagmus. No conjunctival pallor. PERRL. ENT   Head:   Normocephalic and atraumatic.   Nose:   No congestion/rhinnorhea.    Mouth/Throat:   Dry mucous membranes, no pharyngeal erythema. No peritonsillar mass.    Neck:   No meningismus. Full ROM Hematological/Lymphatic/Immunilogical:   No cervical lymphadenopathy. Cardiovascular:   RRR. Symmetric bilateral radial and DP pulses.  No murmurs.  Respiratory:   Normal respiratory effort without tachypnea/retractions. Breath sounds are clear and equal bilaterally. No wheezes/rales/rhonchi. Gastrointestinal:   Soft with right lower quadrant tenderness. Mildly distended. There is no CVA tenderness.  No rebound, rigidity, or guarding. Genitourinary:   deferred Musculoskeletal:   Normal range of motion  in all extremities. No joint effusions.  No lower extremity tenderness.  No edema. Neurologic:   Normal speech .  Motor grossly intact. No gross focal neurologic deficits are appreciated.  Skin:    Skin is warm, dry and intact. No rash noted.  No petechiae, purpura, or bullae.  ____________________________________________    LABS (pertinent positives/negatives) (all labs ordered are listed, but only abnormal results are displayed) Labs Reviewed  COMPREHENSIVE METABOLIC PANEL - Abnormal; Notable for the following:       Result Value   CO2 21 (*)    Glucose, Bld 143 (*)    Creatinine, Ser 1.02 (*)    Calcium 8.7 (*)    Albumin 3.3 (*)    ALT 13 (*)    GFR calc non Af Amer 57 (*)    All other components within normal limits  URINALYSIS, COMPLETE (UACMP) WITH MICROSCOPIC - Abnormal; Notable for the following:    Color, Urine AMBER (*)    APPearance TURBID (*)    Hgb urine dipstick MODERATE (*)    Protein, ur 100 (*)    Nitrite POSITIVE (*)    Leukocytes, UA MODERATE (*)    Bacteria, UA MANY (*)    Squamous Epithelial / LPF 0-5 (*)    All other components within normal limits  LACTIC ACID, PLASMA - Abnormal;  Notable for the following:    Lactic Acid, Venous 2.5 (*)    All other components within normal limits  CBC WITH DIFFERENTIAL/PLATELET - Abnormal; Notable for the following:    Hemoglobin 11.7 (*)    HCT 34.7 (*)    RDW 15.4 (*)    Neutro Abs 9.3 (*)    Lymphs Abs 0.6 (*)    All other components within normal limits  CULTURE, BLOOD (ROUTINE X 2)  CULTURE, BLOOD (ROUTINE X 2)  URINE CULTURE  LIPASE, BLOOD  TROPONIN I  PROCALCITONIN  LACTIC ACID, PLASMA   ____________________________________________   EKG    ____________________________________________    RADIOLOGY  Ct Abdomen Pelvis Wo Contrast  Result Date: 08/28/2016 CLINICAL DATA:  Nausea and vomiting for 2 days. Right lower quadrant pain. EXAM: CT ABDOMEN AND PELVIS WITHOUT CONTRAST TECHNIQUE: Multidetector CT imaging of the abdomen and pelvis was performed following the standard protocol without IV contrast. COMPARISON:  04/19/2010 FINDINGS: Lower chest: Coronary artery calcification is noted. Hepatobiliary: No focal abnormality in the liver on this study without intravenous contrast. There is no evidence for gallstones, gallbladder wall thickening, or pericholecystic fluid. No intrahepatic or extrahepatic biliary dilation. Pancreas: No focal mass lesion. No dilatation of the main duct. No intraparenchymal cyst. No peripancreatic edema. Spleen: No splenomegaly. No focal mass lesion. Adrenals/Urinary Tract: No adrenal nodule or mass. 1.6 cm exophytic lesion upper pole left kidney approaches water attenuation and may be a cyst. 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 No hydronephrosis on either side. Ureters are unremarkable. The urinary bladder appears normal for the degree of distention. Stomach/Bowel: Stomach is nondistended. No gastric wall thickening. No evidence of outlet obstruction. Duodenum is normally positioned as is the ligament of Treitz. No small bowel wall thickening. No small bowel dilatation. The  terminal ileum is normal. The appendix is normal. No gross colonic mass. No colonic wall thickening. No substantial diverticular change. Mild circumferential wall thickening is noted in the rectum (image 69 series 2). Vascular/Lymphatic: There is abdominal aortic atherosclerosis without aneurysm. There is no gastrohepatic or hepatoduodenal ligament lymphadenopathy. No intraperitoneal or retroperitoneal lymphadenopathy. No pelvic sidewall lymphadenopathy. Reproductive: The  uterus has normal CT imaging appearance. There is no adnexal mass. Other: No intraperitoneal free fluid. Musculoskeletal: Bone windows reveal no worrisome lytic or sclerotic osseous lesions. IMPRESSION: 1. No acute findings in the abdomen or pelvis. No findings to explain the patient's history of nausea and vomiting with right lower quadrant pain. Terminal ileum and appendix are normal. No right adnexal mass. 2. 16 mm exophytic lesion upper pole left kidney is likely a cyst but cannot be definitively characterized. Follow-up nonemergent MRI without with contrast could be firm. 3.  Aortic Atherosclerois (ICD10-170.0) Electronically Signed   By: Kennith CenterEric  Mansell M.D.   On: 08/28/2016 20:44   Dg Chest Port 1 View  Result Date: 08/28/2016 CLINICAL DATA:  Nausea and vomiting for 2 days EXAM: PORTABLE CHEST 1 VIEW COMPARISON:  02/25/2016 FINDINGS: Stable borderline heart size. Negative mediastinal contours accounting for rotation. No acute infiltrate or edema. No effusion or pneumothorax. Symmetric hazy density at the apices, likely pleural thickening. Thoracic levocurvature and cervical spine fixation hardware. IMPRESSION: No evidence of active disease. Electronically Signed   By: Marnee SpringJonathon  Watts M.D.   On: 08/28/2016 20:23    ____________________________________________   PROCEDURES Procedures CRITICAL CARE Performed by: Scotty CourtSTAFFORD, Chrles Selley   Total critical care time: 35 minutes  Critical care time was exclusive of separately billable  procedures and treating other patients.  Critical care was necessary to treat or prevent imminent or life-threatening deterioration.  Critical care was time spent personally by me on the following activities: development of treatment plan with patient and/or surrogate as well as nursing, discussions with consultants, evaluation of patient's response to treatment, examination of patient, obtaining history from patient or surrogate, ordering and performing treatments and interventions, ordering and review of laboratory studies, ordering and review of radiographic studies, pulse oximetry and re-evaluation of patient's condition.  ____________________________________________   INITIAL IMPRESSION / ASSESSMENT AND PLAN / ED COURSE  Pertinent labs & imaging results that were available during my care of the patient were reviewed by me and considered in my medical decision making (see chart for details).    Clinical Course as of Aug 29 2330  Sat Aug 28, 2016  1958 Patient presents with fever, disorientation, abdominal pain. Suspected sepsis, give IV fluids and antibiotics. Due to patient's multiple antibiotics with severe reactions, antibiotic regimen for abdominal coverage is limited, but there is a comment that she was tolerating ceftriaxone within the past year. I'll give her ceftriaxone and Flagyl and monitor closely for signs of hypersensitivity reaction.  [PS]    Clinical Course User Index [PS] Sharman CheekStafford, Chynna Buerkle, MD    ----------------------------------------- 11:31 PM on 08/28/2016 -----------------------------------------  Sepsis recheck completed. Hemodynamically stable, good perfusion. Workup reveals urinary tract infection with elevated lactate. In setting of fever and encephalopathy, discussed the case with the hospitalist for further management. Patient appears to be tolerating ceftriaxone. Urine culture sent.    ____________________________________________   FINAL CLINICAL  IMPRESSION(S) / ED DIAGNOSES  Final diagnoses:  Cystitis  Encephalopathy  Sepsis, due to unspecified organism [facility](HCC)      New Prescriptions   No medications on file     Portions of this note were generated with dragon dictation software. Dictation errors may occur despite best attempts at proofreading.    Sharman CheekStafford, Frederich Montilla, MD 08/28/16 [phone]

## 2016-08-28 NOTE — ED Notes (Signed)
Pt. Here via EMS from [facility] for nausea [facility] vomiting.  Pt. Daughter states urine has had a bad smell.  Pt. Has rash to lt. Wrist and rt. Hand between thumb and 2nd finger.

## 2016-08-28 NOTE — ED Notes (Signed)
CODE SEPSIS CALLED TO DOUG AT [facility] 

## 2016-08-28 NOTE — ED Triage Notes (Signed)
Pt. Here via EMS from [facility] for nausea and vomiting for the past 2 days.

## 2016-08-29 DIAGNOSIS — N390 Urinary tract infection, site not specified: Secondary | ICD-10-CM

## 2016-08-29 DIAGNOSIS — A419 Sepsis, unspecified organism: Secondary | ICD-10-CM | POA: Diagnosis present

## 2016-08-29 LAB — BASIC METABOLIC PANEL
ANION GAP: 7 (ref 5–15)
BUN: 13 mg/dL (ref 6–20)
CHLORIDE: 112 mmol/L — AB (ref 101–111)
CO2: 24 mmol/L (ref 22–32)
Calcium: 7.9 mg/dL — ABNORMAL LOW (ref 8.9–10.3)
Creatinine, Ser: 1.23 mg/dL — ABNORMAL HIGH (ref 0.44–1.00)
GFR, EST AFRICAN AMERICAN: 53 mL/min — AB (ref >60)
GFR, EST NON AFRICAN AMERICAN: 45 mL/min — AB (ref >60)
Glucose, Bld: 115 mg/dL — ABNORMAL HIGH (ref 65–99)
POTASSIUM: 4.3 mmol/L (ref 3.5–5.1)
SODIUM: 143 mmol/L (ref 135–145)

## 2016-08-29 LAB — GLUCOSE, CAPILLARY
GLUCOSE-CAPILLARY: 101 mg/dL — AB (ref 65–99)
GLUCOSE-CAPILLARY: 112 mg/dL — AB (ref 65–99)
Glucose-Capillary: 112 mg/dL — ABNORMAL HIGH (ref 65–99)
Glucose-Capillary: 106 mg/dL — ABNORMAL HIGH (ref 65–99)

## 2016-08-29 LAB — CBC
HCT: 28.9 % — ABNORMAL LOW (ref 35.0–47.0)
Hemoglobin: 9.7 g/dL — ABNORMAL LOW (ref 12.0–16.0)
MCH: 30.8 pg (ref 26.0–34.0)
MCHC: 33.6 g/dL (ref 32.0–36.0)
MCV: 91.6 fL (ref 80.0–100.0)
Platelets: 205 10*3/uL (ref 150–440)
RBC: 3.15 MIL/uL — ABNORMAL LOW (ref 3.80–5.20)
RDW: 15.3 % — AB (ref 11.5–14.5)
WBC: 10.4 10*3/uL (ref 3.6–11.0)

## 2016-08-29 LAB — MRSA PCR SCREENING: MRSA BY PCR: NEGATIVE

## 2016-08-29 MED ORDER — VITAMIN B-12 1000 MCG PO TABS
1000.0000 ug | ORAL_TABLET | Freq: Every day | ORAL | Status: DC
  Administered 2016-08-29 – 2016-08-30 (×2): 1000 ug via ORAL
  Filled 2016-08-29 (×2): qty 1

## 2016-08-29 MED ORDER — OXYCODONE HCL ER 10 MG PO T12A
10.0000 mg | EXTENDED_RELEASE_TABLET | Freq: Two times a day (BID) | ORAL | Status: DC
  Administered 2016-08-29 – 2016-08-30 (×3): 10 mg via ORAL
  Filled 2016-08-29 (×4): qty 1

## 2016-08-29 MED ORDER — ALBUTEROL SULFATE (2.5 MG/3ML) 0.083% IN NEBU
2.5000 mg | INHALATION_SOLUTION | Freq: Four times a day (QID) | RESPIRATORY_TRACT | Status: DC | PRN
Start: 2016-08-29 — End: 2016-08-31

## 2016-08-29 MED ORDER — PANTOPRAZOLE SODIUM 40 MG PO TBEC
40.0000 mg | DELAYED_RELEASE_TABLET | Freq: Every day | ORAL | Status: DC
  Administered 2016-08-29 – 2016-08-30 (×2): 40 mg via ORAL
  Filled 2016-08-29 (×2): qty 1

## 2016-08-29 MED ORDER — ALBUTEROL SULFATE (2.5 MG/3ML) 0.083% IN NEBU: 2.5000 mg | INHALATION_SOLUTION | Freq: Every day | RESPIRATORY_TRACT | Status: DC | PRN

## 2016-08-29 MED ORDER — AMLODIPINE BESYLATE 10 MG PO TABS
10.0000 mg | ORAL_TABLET | Freq: Every day | ORAL | Status: DC
  Administered 2016-08-30: 10 mg via ORAL
  Filled 2016-08-29: qty 1

## 2016-08-29 MED ORDER — SODIUM CHLORIDE 0.9 % IV SOLN
INTRAVENOUS | Status: DC
  Administered 2016-08-29 (×2): via INTRAVENOUS

## 2016-08-29 MED ORDER — ONDANSETRON HCL 4 MG PO TABS: 4.0000 mg | ORAL_TABLET | Freq: Four times a day (QID) | ORAL | Status: DC | PRN

## 2016-08-29 MED ORDER — LEVOTHYROXINE SODIUM 50 MCG PO TABS
75.0000 ug | ORAL_TABLET | Freq: Every day | ORAL | Status: DC
  Administered 2016-08-29 – 2016-08-30 (×2): 75 ug via ORAL
  Filled 2016-08-29 (×2): qty 1

## 2016-08-29 MED ORDER — OXYBUTYNIN CHLORIDE 5 MG PO TABS
10.0000 mg | ORAL_TABLET | Freq: Two times a day (BID) | ORAL | Status: DC
  Administered 2016-08-29 – 2016-08-30 (×3): 10 mg via ORAL
  Filled 2016-08-29 (×3): qty 2

## 2016-08-29 MED ORDER — IPRATROPIUM BROMIDE 0.02 % IN SOLN: 0.5000 mg | Freq: Four times a day (QID) | RESPIRATORY_TRACT | Status: DC | PRN

## 2016-08-29 MED ORDER — HYDROCORTISONE 1 % EX CREA
1.0000 "application " | TOPICAL_CREAM | Freq: Three times a day (TID) | CUTANEOUS | Status: DC | PRN
  Administered 2016-08-29 – 2016-08-30 (×2): 1 via TOPICAL
  Filled 2016-08-29: qty 28

## 2016-08-29 MED ORDER — ACETAMINOPHEN 650 MG RE SUPP: 650.0000 mg | Freq: Four times a day (QID) | RECTAL | Status: DC | PRN

## 2016-08-29 MED ORDER — BISACODYL 5 MG PO TBEC: 5.0000 mg | DELAYED_RELEASE_TABLET | Freq: Every day | ORAL | Status: DC | PRN

## 2016-08-29 MED ORDER — DEXTROSE 5 % IV SOLN
2.0000 g | INTRAVENOUS | Status: DC
  Administered 2016-08-29: 2 g via INTRAVENOUS
  Filled 2016-08-29 (×2): qty 2

## 2016-08-29 MED ORDER — MAGNESIUM CITRATE PO SOLN
1.0000 | Freq: Once | ORAL | Status: DC | PRN
  Filled 2016-08-29: qty 296

## 2016-08-29 MED ORDER — LOSARTAN POTASSIUM 50 MG PO TABS
50.0000 mg | ORAL_TABLET | Freq: Two times a day (BID) | ORAL | Status: DC
  Administered 2016-08-29 – 2016-08-30 (×2): 50 mg via ORAL
  Filled 2016-08-29 (×2): qty 1

## 2016-08-29 MED ORDER — SODIUM CHLORIDE 0.9 % IV BOLUS (SEPSIS)
1000.0000 mL | Freq: Once | INTRAVENOUS | Status: AC
  Administered 2016-08-29: 1000 mL via INTRAVENOUS

## 2016-08-29 MED ORDER — ATORVASTATIN CALCIUM 20 MG PO TABS
40.0000 mg | ORAL_TABLET | ORAL | Status: DC
  Administered 2016-08-29 – 2016-08-30 (×2): 40 mg via ORAL
  Filled 2016-08-29 (×2): qty 2

## 2016-08-29 MED ORDER — SENNOSIDES-DOCUSATE SODIUM 8.6-50 MG PO TABS: 1.0000 | ORAL_TABLET | Freq: Every evening | ORAL | Status: DC | PRN

## 2016-08-29 MED ORDER — SENNA 8.6 MG PO TABS
2.0000 | ORAL_TABLET | Freq: Two times a day (BID) | ORAL | Status: DC | PRN
  Administered 2016-08-30: 11:00:00 17.2 mg via ORAL
  Filled 2016-08-29: qty 2

## 2016-08-29 MED ORDER — INSULIN ASPART 100 UNIT/ML ~~LOC~~ SOLN
0.0000 [IU] | Freq: Three times a day (TID) | SUBCUTANEOUS | Status: DC
  Administered 2016-08-30: 2 [IU] via SUBCUTANEOUS

## 2016-08-29 MED ORDER — DIVALPROEX SODIUM 125 MG PO CSDR
500.0000 mg | DELAYED_RELEASE_CAPSULE | Freq: Every day | ORAL | Status: DC
  Administered 2016-08-29 – 2016-08-30 (×2): 500 mg via ORAL
  Filled 2016-08-29 (×2): qty 4

## 2016-08-29 MED ORDER — DIVALPROEX SODIUM 125 MG PO CSDR
750.0000 mg | DELAYED_RELEASE_CAPSULE | Freq: Every day | ORAL | Status: DC
  Administered 2016-08-29: 22:00:00 750 mg via ORAL
  Filled 2016-08-29 (×2): qty 6

## 2016-08-29 MED ORDER — CLONIDINE HCL 0.1 MG PO TABS
0.3000 mg | ORAL_TABLET | Freq: Two times a day (BID) | ORAL | Status: DC
  Administered 2016-08-29: 0.3 mg via ORAL
  Filled 2016-08-29: qty 3

## 2016-08-29 MED ORDER — ACETAMINOPHEN 325 MG PO TABS: 650.0000 mg | ORAL_TABLET | Freq: Four times a day (QID) | ORAL | Status: DC | PRN

## 2016-08-29 MED ORDER — INSULIN ASPART 100 UNIT/ML ~~LOC~~ SOLN: 0.0000 [IU] | Freq: Every day | SUBCUTANEOUS | Status: DC

## 2016-08-29 MED ORDER — CLONAZEPAM 0.5 MG PO TABS
0.5000 mg | ORAL_TABLET | Freq: Two times a day (BID) | ORAL | Status: DC | PRN
  Administered 2016-08-29 – 2016-08-30 (×3): 0.5 mg via ORAL
  Filled 2016-08-29 (×3): qty 1

## 2016-08-29 MED ORDER — ARIPIPRAZOLE 10 MG PO TABS
10.0000 mg | ORAL_TABLET | Freq: Every day | ORAL | Status: DC
  Administered 2016-08-29 – 2016-08-30 (×2): 10 mg via ORAL
  Filled 2016-08-29 (×2): qty 1

## 2016-08-29 MED ORDER — OXYCODONE HCL 5 MG PO TABS
5.0000 mg | ORAL_TABLET | Freq: Every day | ORAL | Status: DC | PRN
  Administered 2016-08-29 – 2016-08-30 (×2): 5 mg via ORAL
  Filled 2016-08-29 (×2): qty 1

## 2016-08-29 MED ORDER — POLYETHYLENE GLYCOL 3350 17 G PO PACK
17.0000 g | PACK | Freq: Every day | ORAL | Status: DC
  Administered 2016-08-29 – 2016-08-30 (×2): 17 g via ORAL
  Filled 2016-08-29 (×2): qty 1

## 2016-08-29 MED ORDER — ALBUTEROL SULFATE (2.5 MG/3ML) 0.083% IN NEBU: 2.5000 mg | INHALATION_SOLUTION | Freq: Three times a day (TID) | RESPIRATORY_TRACT | Status: DC | PRN

## 2016-08-29 MED ORDER — FLUOXETINE HCL 20 MG PO CAPS
40.0000 mg | ORAL_CAPSULE | Freq: Every day | ORAL | Status: DC
  Administered 2016-08-29 – 2016-08-30 (×2): 40 mg via ORAL
  Filled 2016-08-29 (×2): qty 2

## 2016-08-29 MED ORDER — ENOXAPARIN SODIUM 40 MG/0.4ML ~~LOC~~ SOLN: 40.0000 mg | SUBCUTANEOUS | Status: DC

## 2016-08-29 MED ORDER — ENOXAPARIN SODIUM 40 MG/0.4ML ~~LOC~~ SOLN
40.0000 mg | SUBCUTANEOUS | Status: DC
  Administered 2016-08-29: 22:00:00 40 mg via SUBCUTANEOUS
  Filled 2016-08-29: qty 0.4

## 2016-08-29 MED ORDER — ONDANSETRON HCL 4 MG/2ML IJ SOLN
4.0000 mg | Freq: Four times a day (QID) | INTRAMUSCULAR | Status: DC | PRN
Start: 2016-08-29 — End: 2016-08-31

## 2016-08-29 MED ORDER — ASPIRIN EC 81 MG PO TBEC
81.0000 mg | DELAYED_RELEASE_TABLET | Freq: Every day | ORAL | Status: DC
  Administered 2016-08-29 – 2016-08-30 (×2): 81 mg via ORAL
  Filled 2016-08-29 (×2): qty 1

## 2016-08-29 NOTE — NC FL2 (Signed)
[place] MEDICAID FL2 LEVEL OF CARE SCREENING TOOL     IDENTIFICATION  Patient Name: Tammy Shepard Birthdate: 12/22/1950 Sex: female Admission Date (Current Location): 08/28/2016  [place] and [place] Number:  Chiropodist and Address:  [facility], [address], [place], [place] [address]      Provider Number: [id number]  Attending Physician Name and Address:  Enedina Finner, MD  Relative Name and Phone Number:       Current Level of Care: Hospital Recommended Level of Care: Skilled Nursing Facility Prior Approval Number:    Date Approved/Denied:   PASRR Number: [id number]  Discharge Plan: SNF    Current Diagnoses: Patient Active Problem List   Diagnosis Date Noted  . Sepsis secondary to UTI (HCC) 08/29/2016  . Sepsis (HCC) 02/25/2016  . H/O: CVA (cerebrovascular accident) 11/04/2015  . Hypotension 11/04/2015  . Essential hypertension, benign 11/04/2015  . Fever in adult   . Generalized weakness   . 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 Fever 10/31/2015  . UTI (lower urinary tract infection) 10/31/2015    Orientation RESPIRATION BLADDER Height & Weight     Self, Time, Situation  Normal Incontinent Weight: 156 lb 11.2 oz (71.1 kg) Height:  5\' 3"  (160 cm)  BEHAVIORAL SYMPTOMS/MOOD NEUROLOGICAL BOWEL NUTRITION STATUS      Continent Diet (Heart healthy/Carb Modified)  AMBULATORY STATUS COMMUNICATION OF NEEDS Skin   Extensive Assist Verbally Normal                       Personal Care Assistance Level of Assistance  Bathing, Feeding, Dressing Bathing Assistance: Maximum assistance Feeding assistance: Independent Dressing Assistance: Maximum assistance     Functional Limitations Info             SPECIAL CARE FACTORS FREQUENCY                       Contractures Contractures Info: Present    Additional Factors Info  Allergies   Allergies Info: Amoxicillin, Ciprofloxacin, Clindamycin Hcl, Penicillins, Codeine,  Baclofen, Contrast Media Iodinated Diagnostic Agents, Erythromycin, Iodides, Latex, Nickel, Soap, Sulfa Antibiotics, Tape, Tolterodine, Vancomycin           Current Medications (08/29/2016):  This is the current hospital active medication list Current Facility-Administered Medications  Medication Dose Route Frequency Provider Last Rate Last Dose  . 0.9 %  sodium chloride infusion   Intravenous Continuous Hugelmeyer, Alexis, DO 75 mL/hr at 08/29/16 0405    . acetaminophen (TYLENOL) tablet 650 mg  650 mg Oral Q6H PRN Hugelmeyer, Alexis, DO       Or  . acetaminophen (TYLENOL) suppository 650 mg  650 mg Rectal Q6H PRN Hugelmeyer, Alexis, DO      . albuterol (PROVENTIL) (2.5 MG/3ML) 0.083% nebulizer solution 2.5 mg  2.5 mg Inhalation Daily PRN Hugelmeyer, Alexis, DO      . albuterol (PROVENTIL) (2.5 MG/3ML) 0.083% nebulizer solution 2.5 mg  2.5 mg Nebulization Q8H PRN Hugelmeyer, Alexis, DO      . albuterol (PROVENTIL) (2.5 MG/3ML) 0.083% nebulizer solution 2.5 mg  2.5 mg Nebulization Q6H PRN Hugelmeyer, Alexis, DO      . amLODipine (NORVASC) tablet 10 mg  10 mg Oral Daily Hugelmeyer, Alexis, DO   Stopped at 08/29/16 0843  . ARIPiprazole (ABILIFY) tablet 10 mg  10 mg Oral Daily Hugelmeyer, Alexis, DO   10 mg at 08/29/16 1152  . aspirin EC tablet 81 mg  81 mg Oral Daily  Hugelmeyer, Alexis, DO   81 mg at 08/29/16 1033  . atorvastatin (LIPITOR) tablet 40 mg  40 mg Oral BH-q7a Hugelmeyer, Alexis, DO      . bisacodyl (DULCOLAX) EC tablet 5 mg  5 mg Oral Daily PRN Hugelmeyer, Alexis, DO      . cefTRIAXone (ROCEPHIN) 2 g in dextrose 5 % 50 mL IVPB  2 g Intravenous Q24H Hugelmeyer, Alexis, DO      . clonazePAM (KLONOPIN) tablet 0.5 mg  0.5 mg Oral BID PRN Hugelmeyer, Alexis, DO      . cloNIDine (CATAPRES) tablet 0.3 mg  0.3 mg Oral BID Hugelmeyer, Alexis, DO   Stopped at 08/29/16 [id number]  . divalproex (DEPAKOTE SPRINKLE) capsule 500 mg  500 mg Oral Daily Hugelmeyer, Alexis, DO   500 mg at 08/29/16 1153  .  divalproex (DEPAKOTE SPRINKLE) capsule 750 mg  750 mg Oral Q2200 Hugelmeyer, Alexis, DO      . enoxaparin (LOVENOX) injection 40 mg  40 mg Subcutaneous Q24H Hugelmeyer, Alexis, DO      . FLUoxetine (PROZAC) capsule 40 mg  40 mg Oral Daily Hugelmeyer, Alexis, DO   40 mg at 08/29/16 1152  . hydrocortisone cream 1 % 1 application  1 application Topical TID PRN Hugelmeyer, Alexis, DO   1 application at 08/29/16 1152  . insulin aspart (novoLOG) injection 0-15 Units  0-15 Units Subcutaneous TID WC Hugelmeyer, Alexis, DO      . insulin aspart (novoLOG) injection 0-5 Units  0-5 Units Subcutaneous QHS Hugelmeyer, Alexis, DO      . ipratropium (ATROVENT) nebulizer solution 0.5 mg  0.5 mg Nebulization Q6H PRN Hugelmeyer, Alexis, DO      . levothyroxine (SYNTHROID, LEVOTHROID) tablet 75 mcg  75 mcg Oral QAC breakfast Hugelmeyer, Alexis, DO   75 mcg at 08/29/16 1033  . losartan (COZAAR) tablet 50 mg  50 mg Oral BID Hugelmeyer, Alexis, DO   Stopped at 08/29/16 0844  . magnesium citrate solution 1 Bottle  1 Bottle Oral Once PRN Hugelmeyer, Alexis, DO      . ondansetron (ZOFRAN) tablet 4 mg  4 mg Oral Q6H PRN Hugelmeyer, Alexis, DO       Or  . ondansetron (ZOFRAN) injection 4 mg  4 mg Intravenous Q6H PRN Hugelmeyer, Alexis, DO      . oxybutynin (DITROPAN) tablet 10 mg  10 mg Oral BID Hugelmeyer, Alexis, DO   10 mg at 08/29/16 1033  . oxyCODONE (Oxy IR/ROXICODONE) immediate release tablet 5 mg  5 mg Oral Daily PRN Hugelmeyer, Alexis, DO      . oxyCODONE (OXYCONTIN) 12 hr tablet 10 mg  10 mg Oral Q12H Hugelmeyer, Alexis, DO   10 mg at 08/29/16 1152  . pantoprazole (PROTONIX) EC tablet 40 mg  40 mg Oral Daily Hugelmeyer, Alexis, DO   40 mg at 08/29/16 1033  . polyethylene glycol (MIRALAX / GLYCOLAX) packet 17 g  17 g Oral Daily Hugelmeyer, Alexis, DO   17 g at 08/29/16 1032  . senna (SENOKOT) tablet 17.2 mg  2 tablet Oral BID PRN Hugelmeyer, Alexis, DO      . senna-docusate (Senokot-S) tablet 1 tablet  1 tablet Oral  QHS PRN Hugelmeyer, Alexis, DO      . vitamin B-12 (CYANOCOBALAMIN) tablet 1,000 mcg  1,000 mcg Oral Daily Hugelmeyer, Alexis, DO   1,000 mcg at 08/29/16 1033     Discharge Medications: Please see discharge summary for a list of discharge medications.  Relevant Imaging Results:  Relevant Lab Results:  Additional Information SS# [id number]  Judi Cong, LCSW

## 2016-08-29 NOTE — Progress Notes (Signed)
Patient had Allergy and High Fall Risk arm band placed upon arrival to the floor

## 2016-08-29 NOTE — ED Notes (Signed)
Daughter Clayborne ArtistJamie Holden [phone] is the power of attorney on file. [place]  [phone]

## 2016-08-29 NOTE — Plan of Care (Signed)
Problem: Safety: Goal: Ability to remain free from injury will improve Outcome: Progressing Ext bil elevated on pillow repos freq.  Pink foam to bottom  Support in  Left hand  Problem: Fluid Volume: Goal: Ability to maintain a balanced intake and output will improve Outcome: Progressing ivfs infusing  Problem: Bowel/Gastric: Goal: Will not experience complications related to bowel motility Outcome: Progressing purewick in place today

## 2016-08-29 NOTE — Clinical Social Work Note (Signed)
Clinical Social Work Assessment  Patient Details  Name: Tammy Shepard MRN: [id number] Date of Birth: 1950/04/14  Date of referral:  08/29/16               Reason for consult:  Facility Placement                Permission sought to share information with:  Facility Art therapist granted to share information::  Yes, Verbal Permission Granted  Name::        Agency::     Relationship::     Contact Information:     Housing/Transportation Living arrangements for the past 2 months:  [place] of Information:  Patient Patient Interpreter Needed:  None Criminal Activity/Legal Involvement Pertinent to Current Situation/Hospitalization:  No - Comment as needed Significant Relationships:  Adult Children Lives with:  Facility Resident Do you feel safe going back to the place where you live?  Yes Need for family participation in patient care:  No (Coment)  Care giving concerns:  Patient admitted from SNF/Patient requesting change of SNF   Social Worker assessment / plan:  CSW met with the patient at bedside to discuss discharge planning. The patient reported that she is a LTC resident of [facility], and she wants to change to a facility in [place] due to dissatisfaction with the current facility. The CSW explained the barriers for said change due to catchment area. The patient indicated that she understands that she may need to discharge back to [facility] then transfer from that facility. The patient gave permission to contact her daughter/HCPOA Theresia Majors for any reason.  The patient has a UTI. Discharge date is unknown pending medical stability. The patient will need EMS at time of discharge.  Employment status:  Retired Forensic scientist:  Medicare PT Recommendations:  Not assessed at this time [place] / Referral to community resources:  [place]  Patient/Family's Response to care:  The patient thanked  the CSW.  Patient/Family's Understanding of and Emotional Response to Diagnosis, Current Treatment, and Prognosis:  The patient understands that she may need to discharge back to her originating facility.  Emotional Assessment Appearance:  Appears stated age Attitude/Demeanor/Rapport:  Apprehensive Affect (typically observed):  Flat Orientation:  Oriented to Self, Oriented to Place, Oriented to  Time, Oriented to Situation Alcohol / Substance use:  Never Used Psych involvement (Current and /or in the community):  No (Comment)  Discharge Needs  Concerns to be addressed:  Care Coordination, Discharge Planning Concerns Readmission within the last 30 days:  No Current discharge risk:  None Barriers to Discharge:  Continued Medical Work up   [employer], LCSW 08/29/2016, 11:49 AM

## 2016-08-29 NOTE — H&P (Signed)
History and Physical   [facility] - [place] @ [facility] Admission History and Physical [employer], D.O.    Patient Name: Tammy Shepard MR#: [id number] Date of Birth: 1950/06/29 Date of Admission: 08/28/2016  Referring MD/NP/PA: Dr. Scotty Court Primary Care Physician: Laurence Aly, MD Patient coming from: [facility]   Chief Complaint:  Chief Complaint  Patient presents with  . Emesis    Pt. [facility] via [facility] from [facility] for vomiting and nausea.    HPI: Tammy Shepard is a 66 y.o. female with a known history of CVA, DM, HTN, HLD, seizure presents to the emergency department for evaluation of N/V.  Patient was in a usual state of health until two days ago when she developed nausea, vomiting, cramping lower abdominal pain and fever to 100.8  Patient doesn't give much more of a history than that.  Can't elaborate on timeframe or symptoms progression.  She is bed bound at baseline. .   Patient denies fevers/chills, weakness, dizziness, chest pain, shortness of breath, dysuria/frequency, changes in mental status.   Otherwise there has been no change in status. Patient has been taking medication as prescribed and there has been no recent change in medication or diet.  No recent antibiotics.  There has been no recent illness, hospitalizations, travel or sick contacts.    EMS/ED Course: Patient received Rocephin, Flagyl.  Medical admission was requested for ongoing management of sepsis 2/2 UTI.    Review of Systems:  CONSTITUTIONAL: No fever/chills, fatigue, weakness, weight gain/loss, headache. EYES: No blurry or double vision. ENT: No tinnitus, postnasal drip, redness or soreness of the oropharynx. RESPIRATORY: No cough, dyspnea, wheeze.  No hemoptysis.  CARDIOVASCULAR: No chest pain, palpitations, syncope, orthopnea. No lower extremity edema.  GASTROINTESTINAL: Positive nausea, vomiting, abdominal pain. Negative diarrhea, constipation.  No hematemesis, melena or  hematochezia. GENITOURINARY: No dysuria, frequency, hematuria. ENDOCRINE: No polyuria or nocturia. No heat or cold intolerance. HEMATOLOGY: No anemia, bruising, bleeding. INTEGUMENTARY: No rashes, ulcers, lesions. MUSCULOSKELETAL: No arthritis, gout, dyspnea. NEUROLOGIC: No numbness, tingling, ataxia, seizure-type activity, weakness. PSYCHIATRIC: No anxiety, depression, insomnia.  Past Medical History:  Diagnosis Date  . Asthma   . CVA (cerebrovascular accident due to intracerebral hemorrhage) (HCC)   . Diabetes mellitus without complication (HCC)   . Hyperlipemia   . Hypertension   . Seizures (HCC)   . Stroke [facility])     Past Surgical History:  Procedure Laterality Date  . BRAIN SURGERY    . CERVICAL FUSION       reports that she has quit smoking. She has never used smokeless tobacco. She reports that she does not drink alcohol or use drugs.  Allergies  Allergen Reactions  . Amoxicillin Rash    Tolerating rocephin 10/29/15  . Ciprofloxacin Anaphylaxis  . Clindamycin Hcl Swelling, Rash and Other (See Comments)    Other Reaction: BAD RASH AND SWELLING, SKIN BR Severe rash and swelling. Skin break down.  Marland Kitchen Penicillins Anaphylaxis    Tolerating rocephin 11/01/15  . Codeine Nausea And Vomiting  . Baclofen Itching and Rash  . Contrast Media [Iodinated Diagnostic Agents] Itching and Rash  . Erythromycin Rash  . Iodides Rash    Povidone iodine; betadine  . Latex Itching and Rash  . Nickel Rash and Other (See Comments)    OTHER REACTION  . Soap Itching and Rash  . Sulfa Antibiotics Rash  . Tape Rash    (silk tape only) skin break down  . Tolterodine Rash  . Vancomycin Rash  History reviewed. No pertinent family history.  Prior to Admission medications   Medication Sig Start Date End Date Taking? Authorizing Provider  albuterol (PROVENTIL HFA;VENTOLIN HFA) 108 (90 Base) MCG/ACT inhaler Inhale 2 puffs into the lungs daily as needed for shortness of breath. 11/04/12    [provider]  albuterol (PROVENTIL) (2.5 MG/3ML) 0.083% nebulizer solution Take 2.5 mg by nebulization every 8 (eight) hours as needed for shortness of breath. 11/09/13   [provider]  amLODipine (NORVASC) 10 MG tablet Take 10 mg by mouth daily. 07/06/13   [provider]  ARIPiprazole (ABILIFY) 10 MG tablet Take 10 mg by mouth daily.    [provider]  aspirin EC 81 MG tablet Take 81 mg by mouth daily.    [provider]  atorvastatin (LIPITOR) 40 MG tablet Take 40 mg by mouth every morning.    [provider]  clonazePAM (KLONOPIN) 0.5 MG tablet Take 1 tablet (0.5 mg total) by mouth 2 (two) times daily as needed for anxiety. 02/28/16   Adrian Saran, MD  cloNIDine (CATAPRES) 0.3 MG tablet Take 0.3 mg by mouth 2 (two) times daily.    [provider]  Cyanocobalamin (RA VITAMIN B-12 TR) 1000 MCG TBCR Take 1,000 mcg by mouth daily.    [provider]  divalproex (DEPAKOTE SPRINKLE) 125 MG capsule Take 4 capsules (500 mg total) by mouth daily. 02/29/16   Adrian Saran, MD  divalproex (DEPAKOTE SPRINKLE) 125 MG capsule Take 6 capsules (750 mg total) by mouth daily at 10 pm. 02/28/16   Adrian Saran, MD  FLUoxetine (PROZAC) 40 MG capsule Take 40 mg by mouth daily.    [provider]  levothyroxine (SYNTHROID, LEVOTHROID) 75 MCG tablet Take 75 mcg by mouth daily before breakfast. 09/03/13   [provider]  losartan (COZAAR) 50 MG tablet Take 50 mg by mouth 2 (two) times daily. 10/27/15   [provider]  metFORMIN (GLUCOPHAGE) 500 MG tablet Take 500 mg by mouth 2 (two) times daily. 10/16/15   [provider]  omeprazole (PRILOSEC) 20 MG capsule Take 20 mg by mouth daily. 09/18/15   [provider]  oxybutynin (DITROPAN) 5 MG tablet Take 10 mg by mouth 2 (two) times daily. 01/10/14   [provider]  oxyCODONE (OXY IR/ROXICODONE) 5 MG immediate release tablet Take 1 tablet (5 mg total) by  mouth daily as needed for moderate pain or severe pain. 02/28/16   Adrian Saran, MD  oxyCODONE (OXYCONTIN) 10 mg 12 hr tablet Take 1 tablet (10 mg total) by mouth every 12 (twelve) hours. 02/28/16   Adrian Saran, MD  polyethylene glycol (MIRALAX / GLYCOLAX) packet Take 17 g by mouth daily.    [provider]  senna (SENOKOT) 8.6 MG tablet Take 2 tablets by mouth 2 (two) times daily as needed for constipation. 06/05/13   [provider]    Physical Exam: Vitals:   08/28/16 2230 08/28/16 2300 08/28/16 2330 08/29/16 0000  BP: (!) 105/58 (!) 106/55 (!) 99/52 100/60  Pulse:      Resp: 11 (!) 9 (!) 7 16  Temp:      TempSrc:      SpO2:      Weight:      Height:        GENERAL: 66 y.o.-year-old chronically ill appearing white female patient, well-developed, well-nourished lying in the bed in no acute distress.  Pleasant and cooperative.   HEENT: Head atraumatic, normocephalic. Pupils equal. Mucus membranes moist.  NECK: Supple CHEST: Normal breath sounds bilaterally. No wheezing, rales, rhonchi or crackles. No use of accessory muscles of respiration.  No reproducible chest wall tenderness.  CARDIOVASCULAR: S1, S2 normal. No murmurs, rubs, or gallops. Cap refill <2 seconds. Pulses intact distally.  ABDOMEN: Soft, nondistended, mild suprapubic tenderness to palpation. No rebound, guarding, rigidity. Normoactive bowel sounds present in all four quadrants. No organomegaly or mass. EXTREMITIES: LUE contracted, Bilateral ankle contractures. No pedal edema, cyanosis, or clubbing. No calf tenderness or Homan's sign.  NEUROLOGIC: The patient is alert and oriented x 3.  SKIN: Warm, dry, and intact without obvious rash, lesion, or ulcer.    Labs on Admission:  CBC:  Recent Labs Lab 08/28/16 1945  WBC 10.9  NEUTROABS 9.3*  HGB 11.7*  HCT 34.7*  MCV 90.9  PLT 245   Basic Metabolic Panel:  Recent Labs Lab 08/28/16 1945  NA 140  K 3.9  CL 108  CO2 21*  GLUCOSE 143*  BUN 16   CREATININE 1.02*  CALCIUM 8.7*   GFR: Estimated Creatinine Clearance: 50.8 mL/min (A) (by C-G formula based on SCr of 1.02 mg/dL (H)). Liver Function Tests:  Recent Labs Lab 08/28/16 1945  AST 19  ALT 13*  ALKPHOS 80  BILITOT 0.5  PROT 7.1  ALBUMIN 3.3*    Recent Labs Lab 08/28/16 1945  LIPASE 24   No results for input(s): AMMONIA in the last 168 hours. Coagulation Profile: No results for input(s): INR, PROTIME in the last 168 hours. Cardiac Enzymes:  Recent Labs Lab 08/28/16 1945  TROPONINI <0.03   BNP (last 3 results) No results for input(s): PROBNP in the last 8760 hours. HbA1C: No results for input(s): HGBA1C in the last 72 hours. CBG: No results for input(s): GLUCAP in the last 168 hours. Lipid Profile: No results for input(s): CHOL, HDL, LDLCALC, TRIG, CHOLHDL, LDLDIRECT in the last 72 hours. Thyroid Function Tests: No results for input(s): TSH, T4TOTAL, FREET4, T3FREE, THYROIDAB in the last 72 hours. Anemia Panel: No results for input(s): VITAMINB12, FOLATE, FERRITIN, TIBC, IRON, RETICCTPCT in the last 72 hours. Urine analysis:    Component Value Date/Time   COLORURINE AMBER (A) 08/28/2016 2051   APPEARANCEUR TURBID (A) 08/28/2016 2051   APPEARANCEUR Cloudy 08/18/2011 1018   LABSPEC 1.014 08/28/2016 2051   LABSPEC 1.017 08/18/2011 1018   PHURINE 5.0 08/28/2016 2051   GLUCOSEU NEGATIVE 08/28/2016 2051   GLUCOSEU Negative 08/18/2011 1018   HGBUR MODERATE (A) 08/28/2016 2051   BILIRUBINUR NEGATIVE 08/28/2016 2051   BILIRUBINUR Negative 08/18/2011 1018   KETONESUR NEGATIVE 08/28/2016 2051   PROTEINUR 100 (A) 08/28/2016 2051   NITRITE POSITIVE (A) 08/28/2016 2051   LEUKOCYTESUR MODERATE (A) 08/28/2016 2051   LEUKOCYTESUR 3+ 08/18/2011 1018   Sepsis Labs: @LABRCNTIP (procalcitonin:4,lacticidven:4) )No results found for this or any previous visit (from the past 240 hour(s)).   Radiological Exams on Admission: Ct Abdomen Pelvis Wo  Contrast  Result Date: 08/28/2016 CLINICAL DATA:  Nausea and vomiting for 2 days. Right lower quadrant pain. EXAM: CT ABDOMEN AND PELVIS WITHOUT CONTRAST TECHNIQUE: Multidetector CT imaging of the abdomen and pelvis was performed following the standard protocol without IV contrast. COMPARISON:  04/19/2010 FINDINGS: Lower chest: Coronary artery calcification is noted. Hepatobiliary: No focal abnormality in the liver on this study without intravenous contrast. There is no evidence for gallstones, gallbladder wall thickening, or pericholecystic fluid. No intrahepatic or extrahepatic biliary dilation. Pancreas: No focal mass lesion. No dilatation of the main duct. No intraparenchymal cyst. No peripancreatic edema.  Spleen: No splenomegaly. No focal mass lesion. Adrenals/Urinary Tract: No adrenal nodule or mass. 1.6 cm exophytic lesion upper pole left kidney approaches water attenuation and may be a cyst. 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 No hydronephrosis on either side. Ureters are unremarkable. The urinary bladder appears normal for the degree of distention. Stomach/Bowel: Stomach is nondistended. No gastric wall thickening. No evidence of outlet obstruction. Duodenum is normally positioned as is the ligament of Treitz. No small bowel wall thickening. No small bowel dilatation. The terminal ileum is normal. The appendix is normal. No gross colonic mass. No colonic wall thickening. No substantial diverticular change. Mild circumferential wall thickening is noted in the rectum (image 69 series 2). Vascular/Lymphatic: There is abdominal aortic atherosclerosis without aneurysm. There is no gastrohepatic or hepatoduodenal ligament lymphadenopathy. No intraperitoneal or retroperitoneal lymphadenopathy. No pelvic sidewall lymphadenopathy. Reproductive: The uterus has normal CT imaging appearance. There is no adnexal mass. Other: No intraperitoneal free fluid. Musculoskeletal: Bone windows reveal no  worrisome lytic or sclerotic osseous lesions. IMPRESSION: 1. No acute findings in the abdomen or pelvis. No findings to explain the patient's history of nausea and vomiting with right lower quadrant pain. Terminal ileum and appendix are normal. No right adnexal mass. 2. 16 mm exophytic lesion upper pole left kidney is likely a cyst but cannot be definitively characterized. Follow-up nonemergent MRI without with contrast could be firm. 3.  Aortic Atherosclerois (ICD10-170.0) Electronically Signed   By: Kennith CenterEric  Mansell M.D.   On: 08/28/2016 20:44   Dg Chest Port 1 View  Result Date: 08/28/2016 CLINICAL DATA:  Nausea and vomiting for 2 days EXAM: PORTABLE CHEST 1 VIEW COMPARISON:  02/25/2016 FINDINGS: Stable borderline heart size. Negative mediastinal contours accounting for rotation. No acute infiltrate or edema. No effusion or pneumothorax. Symmetric hazy density at the apices, likely pleural thickening. Thoracic levocurvature and cervical spine fixation hardware. IMPRESSION: No evidence of active disease. Electronically Signed   By: Marnee SpringJonathon  Watts M.D.   On: 08/28/2016 20:23   Assessment/Plan  This is a 66 y.o. female with a history of CVA, DM, HTN, HLD, seizure now being admitted with:  #. Sepsis secondary to UTI - Admit to inpatient with telemetry monitoring - IV antibiotics:  - IV fluid hydration - Follow up blood,urine & sputum cultures - Repeat CBC in am.  - Infectious disease consultation has been requested  Admission status: Inpatient IV Fluids: NS Diet/Nutrition: HH, CC Consults called: Social work (does not want to go back to [facility]) DVT Px: Lovenox, SCDs and early ambulation. Code Status: Full Code  Disposition Plan: To home in 1-2 days  All the records are reviewed and case discussed with ED provider. Management plans discussed with the patient and/or family who express understanding and agree with plan of care.  Treyvon Blahut D.O. on 08/29/2016 at 12:31 AM Between  7am to 6pm - Pager - [phone] After 6pm go to [email or web address] - Social research officer, governmentpassword EPAS [facility] [place] [facility] Office [phone] CC: Primary care physician; Laurence AlyGay, Laura C, MD   08/29/2016, 12:31 AM

## 2016-08-29 NOTE — Progress Notes (Signed)
Pharmacy Antibiotic Note  Woodroe ModeCindy S Chaddock is a 66 y.o. female admitted on 08/28/2016 with UTI.  Pharmacy has been consulted for ceftriaxone dosing.  Plan: Will start ceftriaxone 2g IV daily  Height: 5\' 3"  (160 cm) Weight: 145 lb (65.8 kg) IBW/kg (Calculated) : 52.4  Temp (24hrs), Avg:98.9 F (37.2 C), Min:97.5 F (36.4 C), Max:100.2 F (37.9 C)   Recent Labs Lab 08/28/16 1945 08/28/16 2308  WBC 10.9  --   CREATININE 1.02*  --   LATICACIDVEN 2.5* 1.9    Estimated Creatinine Clearance: 50.8 mL/min (A) (by C-G formula based on SCr of 1.02 mg/dL (H)).    Allergies  Allergen Reactions  . Amoxicillin Rash    Tolerating rocephin 10/29/15  . Ciprofloxacin Anaphylaxis  . Clindamycin Hcl Swelling, Rash and Other (See Comments)    Other Reaction: BAD RASH AND SWELLING, SKIN BR Severe rash and swelling. Skin break down.  Marland Kitchen. Penicillins Anaphylaxis    Tolerating rocephin 11/01/15  . Codeine Nausea And Vomiting  . Baclofen Itching and Rash  . Contrast Media [Iodinated Diagnostic Agents] Itching and Rash  . Erythromycin Rash  . Iodides Rash    Povidone iodine; betadine  . Latex Itching and Rash  . Nickel Rash and Other (See Comments)    OTHER REACTION  . Soap Itching and Rash  . Sulfa Antibiotics Rash  . Tape Rash    (silk tape only) skin break down  . Tolterodine Rash  . Vancomycin Rash    Thank you for allowing pharmacy to be a part of this patient's care.  Thomasene Rippleavid Damyon Mullane, PharmD, BCPS Clinical Pharmacist 08/29/2016

## 2016-08-29 NOTE — Progress Notes (Signed)
[facility] [place] at [facility]   PATIENT NAME: Tammy SiasCindy Shepard    MR#:  [id number]  DATE OF BIRTH:  01/19/1951  SUBJECTIVE:   Came in with weakness and not feeling well REVIEW OF SYSTEMS:   Review of Systems  Constitutional: Negative for chills, fever and weight loss.  HENT: Negative for ear discharge, ear pain and nosebleeds.   Eyes: Negative for blurred vision, pain and discharge.  Respiratory: Negative for sputum production, shortness of breath, wheezing and stridor.   Cardiovascular: Negative for chest pain, palpitations, orthopnea and PND.  Gastrointestinal: Negative for abdominal pain, diarrhea, nausea and vomiting.  Genitourinary: Negative for frequency and urgency.  Musculoskeletal: Negative for back pain and joint pain.  Neurological: Positive for focal weakness and weakness. Negative for sensory change and speech change.  Psychiatric/Behavioral: Negative for depression and hallucinations. The patient is not nervous/anxious.    Tolerating Diet:yes Tolerating PT: SNF  DRUG ALLERGIES:   Allergies  Allergen Reactions  . Amoxicillin Rash    Tolerating rocephin 10/29/15  . Ciprofloxacin Anaphylaxis  . Clindamycin Hcl Swelling, Rash and Other (See Comments)    Other Reaction: BAD RASH AND SWELLING, SKIN BR Severe rash and swelling. Skin break down.  Marland Kitchen. Penicillins Anaphylaxis    Tolerating rocephin 11/01/15  . Codeine Nausea And Vomiting  . Baclofen Itching and Rash  . Contrast Media [Iodinated Diagnostic Agents] Itching and Rash  . Erythromycin Rash  . Iodides Rash    Povidone iodine; betadine  . Latex Itching and Rash  . Nickel Rash and Other (See Comments)    OTHER REACTION  . Soap Itching and Rash  . Sulfa Antibiotics Rash  . Tape Rash    (silk tape only) skin break down  . Tolterodine Rash  . Vancomycin Rash    VITALS:  Blood pressure (!) 132/53, pulse 62, temperature 98.4 F (36.9 C), temperature source Oral, resp. rate 20,  height 5\' 3"  (1.6 m), weight 71.1 kg (156 lb 11.2 oz), SpO2 100 %.  PHYSICAL EXAMINATION:   Physical Exam  GENERAL:  66 y.o.-year-old patient lying in the bed with no acute distress.  EYES: Pupils equal, round, reactive to light and accommodation. No scleral icterus. Extraocular muscles intact.  HEENT: Head atraumatic, normocephalic. Oropharynx and nasopharynx clear.  NECK:  Supple, no jugular venous distention. No thyroid enlargement, no tenderness.  LUNGS: Normal breath sounds bilaterally, no wheezing, rales, rhonchi. No use of accessory muscles of respiration.  CARDIOVASCULAR: S1, S2 normal. No murmurs, rubs, or gallops.  ABDOMEN: Soft, nontender, nondistended. Bowel sounds present. No organomegaly or mass.  EXTREMITIES: No cyanosis, clubbing or edema b/l.    NEUROLOGIC: chronic left sided weakness due to previus stroke PSYCHIATRIC:  patient is alert and oriented x 3.  SKIN: No obvious rash, lesion, or ulcer.   LABORATORY PANEL:  CBC  Recent Labs Lab 08/29/16 0410  WBC 10.4  HGB 9.7*  HCT 28.9*  PLT 205    Chemistries   Recent Labs Lab 08/28/16 1945 08/29/16 0410  NA 140 143  K 3.9 4.3  CL 108 112*  CO2 21* 24  GLUCOSE 143* 115*  BUN 16 13  CREATININE 1.02* 1.23*  CALCIUM 8.7* 7.9*  AST 19  --   ALT 13*  --   ALKPHOS 80  --   BILITOT 0.5  --    Cardiac Enzymes  Recent Labs Lab 08/28/16 1945  TROPONINI <0.03   RADIOLOGY:  Ct Abdomen Pelvis Wo Contrast  Result Date: 08/28/2016  CLINICAL DATA:  Nausea and vomiting for 2 days. Right lower quadrant pain. EXAM: CT ABDOMEN AND PELVIS WITHOUT CONTRAST TECHNIQUE: Multidetector CT imaging of the abdomen and pelvis was performed following the standard protocol without IV contrast. COMPARISON:  04/19/2010 FINDINGS: Lower chest: Coronary artery calcification is noted. Hepatobiliary: No focal abnormality in the liver on this study without intravenous contrast. There is no evidence for gallstones, gallbladder wall  thickening, or pericholecystic fluid. No intrahepatic or extrahepatic biliary dilation. Pancreas: No focal mass lesion. No dilatation of the main duct. No intraparenchymal cyst. No peripancreatic edema. Spleen: No splenomegaly. No focal mass lesion. Adrenals/Urinary Tract: No adrenal nodule or mass. 1.6 cm exophytic lesion upper pole left kidney approaches water attenuation and may be a cyst. 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 No hydronephrosis on either side. Ureters are unremarkable. The urinary bladder appears normal for the degree of distention. Stomach/Bowel: Stomach is nondistended. No gastric wall thickening. No evidence of outlet obstruction. Duodenum is normally positioned as is the ligament of Treitz. No small bowel wall thickening. No small bowel dilatation. The terminal ileum is normal. The appendix is normal. No gross colonic mass. No colonic wall thickening. No substantial diverticular change. Mild circumferential wall thickening is noted in the rectum (image 69 series 2). Vascular/Lymphatic: There is abdominal aortic atherosclerosis without aneurysm. There is no gastrohepatic or hepatoduodenal ligament lymphadenopathy. No intraperitoneal or retroperitoneal lymphadenopathy. No pelvic sidewall lymphadenopathy. Reproductive: The uterus has normal CT imaging appearance. There is no adnexal mass. Other: No intraperitoneal free fluid. Musculoskeletal: Bone windows reveal no worrisome lytic or sclerotic osseous lesions. IMPRESSION: 1. No acute findings in the abdomen or pelvis. No findings to explain the patient's history of nausea and vomiting with right lower quadrant pain. Terminal ileum and appendix are normal. No right adnexal mass. 2. 16 mm exophytic lesion upper pole left kidney is likely a cyst but cannot be definitively characterized. Follow-up nonemergent MRI without with contrast could be firm. 3.  Aortic Atherosclerois (ICD10-170.0) Electronically Signed   By: Kennith Center M.D.   On: 08/28/2016 20:44   Dg Chest Port 1 View  Result Date: 08/28/2016 CLINICAL DATA:  Nausea and vomiting for 2 days EXAM: PORTABLE CHEST 1 VIEW COMPARISON:  02/25/2016 FINDINGS: Stable borderline heart size. Negative mediastinal contours accounting for rotation. No acute infiltrate or edema. No effusion or pneumothorax. Symmetric hazy density at the apices, likely pleural thickening. Thoracic levocurvature and cervical spine fixation hardware. IMPRESSION: No evidence of active disease. Electronically Signed   By: Marnee Spring M.D.   On: 08/28/2016 20:23   ASSESSMENT AND PLAN:  66 y.o. female with a history of CVA, DM, HTN, HLD, seizure now being admitted with:  #. 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 Management plans discussed with the patient, family and they are in agreement.  CODE STATUS: FULL  DVT Prophylaxis: lovenox  TOTAL TIME TAKING CARE OF THIS PATIENT: *25* minutes.  >50% time spent on counselling and coordination of care  POSSIBLE D/C IN 1-2 DAYS, DEPENDING ON CLINICAL CONDITION.  Note: This dictation was prepared with Dragon dictation along with smaller phrase technology. Any transcriptional errors that result from this process are unintentional.  Saron Tweed M.D on 08/29/2016 at 8:06 PM  Between 7am to 6pm - Pager - [phone]  After 6pm go to [email or web address] - password [email or web address]  [facility]  Office  [phone]  CC: Primary care physician; Laurence Aly, MD

## 2016-08-29 NOTE — Progress Notes (Signed)
Physical Therapy Evaluation Patient Details Name: Tammy Shepard MRN: [id number] DOB: 09-25-1950 Today's Date: 08/29/2016   History of Present Illness  Patient is a 66 y.o. female admitted on 21 JUL from [facility] for increasing nausea/vomiting/fever. PMH includes CVA, DMII, HTN, HLD, and seizure.  Clinical Impression  Patient is a pleasant female admitted for above listed reasons. Patient bedbound at baseline per notes with last attempt at standing being last week at [facility], per report. Patient demonstrates hemiplegia on L with UE in contracted position. Patient required maximal-total assistance to perform bed mobility and move into sitting, still leaning on HOB. Patient is deemed unsafe to return to apartment at this time and will benefit from skilled and progressive PT to return to transfers with hemiwalker. Patient will benefit with f/u with SNF upon d/c when medically ready.    Follow Up Recommendations SNF    Equipment Recommendations  None recommended by PT    Recommendations for Other Services       Precautions / Restrictions Precautions Precautions: Fall Restrictions Weight Bearing Restrictions: No      Mobility  Bed Mobility Overal bed mobility: Needs Assistance Bed Mobility: Supine to Sit;Rolling;Sit to Supine Rolling: Max assist   Supine to sit: Max assist;HOB elevated Sit to supine: Total assist;HOB elevated   General bed mobility comments: Patient required maximal assistance to roll to L after hooking R LE under L LE. Unable to push through R UE to [facility] Required total assistance to return to supine.  Transfers                    Ambulation/Gait                Stairs            Wheelchair Mobility    Modified Rankin (Stroke Patients Only)       Balance Overall balance assessment: Needs assistance Sitting-balance support: Single extremity supported;Feet supported Sitting balance-Leahy Scale:  Zero                                       Pertinent Vitals/Pain Pain Assessment: No/denies pain    Home Living Family/patient expects to be discharged to:: Skilled nursing facility                      Prior Function Level of Independence: Needs assistance   Gait / Transfers Assistance Needed: Stand-pivot transfers with hemiwalker  ADL's / Homemaking Assistance Needed: Performed by daughter        Hand Dominance        Extremity/Trunk Assessment   Upper Extremity Assessment Upper Extremity Assessment: LUE deficits/detail;Generalized weakness LUE Deficits / Details: Hemiplegic in contracted position; sensation intact LUE: Unable to fully assess due to pain    Lower Extremity Assessment Lower Extremity Assessment: Generalized weakness;LLE deficits/detail LLE Deficits / Details: Trace contractions with limited passive knee flexion/dorsiflexion; sensation intact       Communication   Communication: No difficulties  Cognition Arousal/Alertness: Awake/alert Behavior During Therapy: Flat affect Overall Cognitive Status: Difficult to assess                                 General Comments: Patient stated she could do something then stared at PT       General Comments  Exercises     Assessment/Plan    PT Assessment Patient needs continued PT services  PT Problem List Decreased strength;Decreased range of motion;Decreased activity tolerance;Decreased balance;Decreased mobility;Decreased cognition;Decreased safety awareness       PT Treatment Interventions DME instruction;Gait training;Stair training;Functional mobility training;Therapeutic activities;Therapeutic exercise;Balance training;Cognitive remediation;Patient/family education;Wheelchair mobility training    PT Goals (Current goals can be found in the Care Plan section)  Acute Rehab PT Goals Patient Stated Goal: To get better PT Goal Formulation: With  patient Time For Goal Achievement: 09/12/16 Potential to Achieve Goals: Poor    Frequency Min 2X/week   Barriers to discharge Inaccessible home environment;Decreased caregiver support      Co-evaluation               AM-PAC PT "6 Clicks" Daily Activity  Outcome Measure Difficulty turning over in bed (including adjusting bedclothes, sheets and blankets)?: Total Difficulty moving from lying on back to sitting on the side of the bed? : Total Difficulty sitting down on and standing up from a chair with arms (e.g., wheelchair, bedside commode, etc,.)?: Total Help needed moving to and from a bed to chair (including a wheelchair)?: Total Help needed walking in hospital room?: Total Help needed climbing 3-5 steps with a railing? : Total 6 Click Score: 6    End of Session   Activity Tolerance: Patient tolerated treatment well Patient left: in bed;with call bell/phone within reach;with bed alarm set   PT Visit Diagnosis: Difficulty in walking, not elsewhere classified (R26.2);Muscle weakness (generalized) (M62.81);Hemiplegia and hemiparesis Hemiplegia - Right/Left: Left    Time: 1610-9604 PT Time Calculation (min) (ACUTE ONLY): 20 min   Charges:   PT Evaluation $PT Eval Low Complexity: 1 Procedure     PT G Codes:          Neita Carp, PT, DPT 08/29/2016, 2:46 PM

## 2016-08-29 NOTE — Progress Notes (Signed)
Noted for presence of Allergy and Fall wrist bands.

## 2016-08-30 LAB — GLUCOSE, CAPILLARY
GLUCOSE-CAPILLARY: 86 mg/dL (ref 65–99)
GLUCOSE-CAPILLARY: 144 mg/dL — AB (ref 65–99)
Glucose-Capillary: 95 mg/dL (ref 65–99)

## 2016-08-30 LAB — HIV ANTIBODY (ROUTINE TESTING W REFLEX): HIV Screen 4th Generation wRfx: NONREACTIVE

## 2016-08-30 MED ORDER — CEPHALEXIN 500 MG PO CAPS
500.0000 mg | ORAL_CAPSULE | Freq: Two times a day (BID) | ORAL | Status: DC
  Administered 2016-08-30: 500 mg via ORAL
  Filled 2016-08-30: qty 1

## 2016-08-30 MED ORDER — CEPHALEXIN 500 MG PO CAPS: 500.0000 mg | ORAL_CAPSULE | Freq: Two times a day (BID) | ORAL | 0 refills | Status: DC

## 2016-08-30 NOTE — Care Management CC44 (Signed)
Condition Code 44 Documentation Completed  Patient Details  Name: Tammy Shepard MRN: [id number] Date of Birth: [birth year]   Condition Code 44 given:    Patient signature on Condition Code 44 notice:    Documentation of 2 MD's agreement:   yes Code 44 added to claim:   unable to change; [facility]    [name], RN 08/30/2016, 10:59 AM

## 2016-08-30 NOTE — Clinical Social Work Note (Signed)
Pt is ready for discharge today and will return to [facility]. Pt and daughter are aware and agreeable to discharge [facility]. Facility is ready to accept as they have received discharge information. RN will call report. [facility]. CSW is signing off as no further needs identified.   Dede QuerySarah Djuna Frechette, MSW, LCSW  Clinical Social Worker  [phone]

## 2016-08-30 NOTE — Discharge Summary (Signed)
[facility] - [place] at [facility]   PATIENT NAME: Tammy Shepard    MR#:  [id number]  DATE OF BIRTH:  Jul 09, 1950  DATE OF ADMISSION:  08/28/2016 ADMITTING PHYSICIAN: Tonye Royalty, DO  DATE OF DISCHARGE: 08/30/2016  PRIMARY CARE PHYSICIAN: Laurence Aly, MD    ADMISSION DIAGNOSIS:  Encephalopathy [G93.40] Cystitis [N30.90] Sepsis, due to unspecified organism (HCC) [A41.9]  DISCHARGE DIAGNOSIS:  Sepsis due to E coli UTI  SECONDARY DIAGNOSIS:   Past Medical History:  Diagnosis Date  . Asthma   . CVA (cerebrovascular accident due to intracerebral hemorrhage) (HCC)   . Diabetes mellitus without complication (HCC)   . Hyperlipemia   . Hypertension   . Seizures (HCC)   . Stroke [facility])     HOSPITAL COURSE:  66 y.o.femalewith a history of CVA, DM, HTN, HLD, seizurenow being admitted with:  #. Sepsis secondary to UTI - - IV antibiotics: Rocephin---po keflex - received IV fluid hydration - Blood cultures negative - WBC normal  -UC ecoli  # Late effects  left hemiparesis from previous CVA  # HTN Cont home meds  #PT recommends SNF---pt will d/c back to [facility] if CSW is unable to get new facility [facility] with [facility]  CONSULTS OBTAINED:    DRUG ALLERGIES:   Allergies  Allergen Reactions  . Amoxicillin Rash    Tolerating rocephin 10/29/15  . Ciprofloxacin Anaphylaxis  . Clindamycin Hcl Swelling, Rash and Other (See Comments)    Other Reaction: BAD RASH AND SWELLING, SKIN BR Severe rash and swelling. Skin break down.  Marland Kitchen Penicillins Anaphylaxis    Tolerating rocephin 11/01/15  . Codeine Nausea And Vomiting  . Baclofen Itching and Rash  . Contrast Media [Iodinated Diagnostic Agents] Itching and Rash  . Erythromycin Rash  . Iodides Rash    Povidone iodine; betadine  . Latex Itching and Rash  . Nickel Rash and Other (See Comments)    OTHER REACTION  . Soap Itching and Rash  . Sulfa Antibiotics Rash  .  Tape Rash    (silk tape only) skin break down  . Tolterodine Rash  . Vancomycin Rash    DISCHARGE MEDICATIONS:   Current Discharge Medication List    START taking these medications   Details  cephALEXin (KEFLEX) 500 MG capsule Take 1 capsule (500 mg total) by mouth every 12 (twelve) hours. Qty: 14 capsule, Refills: 0      CONTINUE these medications which have NOT CHANGED   Details  albuterol (PROVENTIL HFA;VENTOLIN HFA) 108 (90 Base) MCG/ACT inhaler Inhale 2 puffs into the lungs daily as needed for shortness of breath.    albuterol (PROVENTIL) (2.5 MG/3ML) 0.083% nebulizer solution Take 2.5 mg by nebulization every 8 (eight) hours as needed for shortness of breath.    amLODipine (NORVASC) 10 MG tablet Take 10 mg by mouth daily.    ARIPiprazole (ABILIFY) 10 MG tablet Take 10 mg by mouth daily.    aspirin EC 81 MG tablet Take 81 mg by mouth daily.    atorvastatin (LIPITOR) 40 MG tablet Take 40 mg by mouth every morning.    clonazePAM (KLONOPIN) 0.5 MG tablet Take 1 tablet (0.5 mg total) by mouth 2 (two) times daily as needed for anxiety. Qty: 10 tablet, Refills: 0    cloNIDine (CATAPRES) 0.3 MG tablet Take 0.3 mg by mouth 2 (two) times daily.    Cyanocobalamin (RA VITAMIN B-12 TR) 1000 MCG TBCR Take 1,000 mcg by mouth daily.    FLUoxetine (PROZAC)  40 MG capsule Take 40 mg by mouth daily.    levothyroxine (SYNTHROID, LEVOTHROID) 100 MCG tablet Take 100 mcg by mouth daily before breakfast.     losartan (COZAAR) 50 MG tablet Take 50 mg by mouth 2 (two) times daily. Refills: 0    metFORMIN (GLUCOPHAGE) 500 MG tablet Take 500 mg by mouth 2 (two) times daily.    omeprazole (PRILOSEC) 20 MG capsule Take 20 mg by mouth daily. Refills: 1    oxybutynin (DITROPAN) 5 MG tablet Take 10 mg by mouth 2 (two) times daily.    oxyCODONE (OXY IR/ROXICODONE) 5 MG immediate release tablet Take 1 tablet (5 mg total) by mouth daily as needed for moderate pain or severe pain. Qty: 10 tablet,  Refills: 0    oxyCODONE (OXYCONTIN) 10 mg 12 hr tablet Take 1 tablet (10 mg total) by mouth every 12 (twelve) hours. Qty: 10 tablet, Refills: 0    polyethylene glycol (MIRALAX / GLYCOLAX) packet Take 17 g by mouth daily.    senna (SENOKOT) 8.6 MG tablet Take 2 tablets by mouth 2 (two) times daily as needed for constipation.    !! divalproex (DEPAKOTE SPRINKLE) 125 MG capsule Take 4 capsules (500 mg total) by mouth daily. Qty: 20 capsule, Refills: 0    !! divalproex (DEPAKOTE SPRINKLE) 125 MG capsule Take 6 capsules (750 mg total) by mouth daily at 10 pm. Qty: 20 capsule, Refills: 0     !! - Potential duplicate medications found. Please discuss with provider.      If you experience worsening of your admission symptoms, develop shortness of breath, life threatening emergency, suicidal or homicidal thoughts you must seek medical attention immediately by calling 911 or calling your MD immediately  if symptoms less severe.  You Must read complete instructions/literature along with all the possible adverse reactions/side effects for all the Medicines you take and that have been prescribed to you. Take any new Medicines after you have completely understood and accept all the possible adverse reactions/side effects.   Please note  You were cared for by a hospitalist during your hospital stay. If you have any questions about your discharge medications or the care you received while you were in the hospital after you are discharged, you can call the unit and asked to speak with the hospitalist on call if the hospitalist that took care of you is not available. Once you are discharged, your primary care physician will handle any further medical issues. Please note that NO REFILLS for any discharge medications will be authorized once you are discharged, as it is imperative that you return to your primary care physician (or establish a relationship with a primary care physician if you do not have one) for  your aftercare needs so that they can reassess your need for medications and monitor your lab values. Today   SUBJECTIVE   No new complaints  VITAL SIGNS:  Blood pressure 119/65, pulse 62, temperature 97.9 F (36.6 C), temperature source Oral, resp. rate 18, height 5\' 3"  (1.6 m), weight 71.1 kg (156 lb 11.2 oz), SpO2 98 %.  I/O:   Intake/Output Summary (Last 24 hours) at 08/30/16 0901 Last data filed at 08/30/16 0800  Gross per 24 hour  Intake             2661 ml  Output             1200 ml  Net             1461  ml    PHYSICAL EXAMINATION:  GENERAL:  66 y.o.-year-old patient lying in the bed with no acute distress.  EYES: Pupils equal, round, reactive to light and accommodation. No scleral icterus. Extraocular muscles intact.  HEENT: Head atraumatic, normocephalic. Oropharynx and nasopharynx clear.  NECK:  Supple, no jugular venous distention. No thyroid enlargement, no tenderness.  LUNGS: Normal breath sounds bilaterally, no wheezing, rales,rhonchi or crepitation. No use of accessory muscles of respiration.  CARDIOVASCULAR: S1, S2 normal. No murmurs, rubs, or gallops.  ABDOMEN: Soft, non-tender, non-distended. Bowel sounds present. No organomegaly or mass.  EXTREMITIES: No pedal edema, cyanosis, or clubbing.  NEUROLOGIC: chronic left hemiplegia with contracture due to previus stroke PSYCHIATRIC: The patient is alert and oriented x 3.  SKIN: No obvious rash, lesion, or ulcer.   DATA REVIEW:   CBC   Recent Labs Lab 08/29/16 0410  WBC 10.4  HGB 9.7*  HCT 28.9*  PLT 205    Chemistries   Recent Labs Lab 08/28/16 1945 08/29/16 0410  NA 140 143  K 3.9 4.3  CL 108 112*  CO2 21* 24  GLUCOSE 143* 115*  BUN 16 13  CREATININE 1.02* 1.23*  CALCIUM 8.7* 7.9*  AST 19  --   ALT 13*  --   ALKPHOS 80  --   BILITOT 0.5  --     Microbiology Results   Recent Results (from the past 240 hour(s))  Blood Culture (routine x 2)     Status: None (Preliminary result)    Collection Time: 08/28/16  8:51 PM  Result Value Ref Range Status   Specimen Description BLOOD RIGHT ANTECUBITAL  Final   Special Requests   Final    BOTTLES DRAWN AEROBIC AND ANAEROBIC Blood Culture adequate volume   Culture NO GROWTH 2 DAYS  Final   Report Status PENDING  Incomplete  Blood Culture (routine x 2)     Status: None (Preliminary result)   Collection Time: 08/28/16  8:51 PM  Result Value Ref Range Status   Specimen Description BLOOD RIGHT HAND  Final   Special Requests   Final    BOTTLES DRAWN AEROBIC AND ANAEROBIC Blood Culture results may not be optimal due to an inadequate volume of blood received in culture bottles   Culture NO GROWTH 2 DAYS  Final   Report Status PENDING  Incomplete  Urine culture     Status: Abnormal (Preliminary result)   Collection Time: 08/28/16  8:51 PM  Result Value Ref Range Status   Specimen Description URINE, RANDOM  Final   Special Requests NONE  Final   Culture >=100,000 COLONIES/mL ESCHERICHIA COLI (A)  Final   Report Status PENDING  Incomplete  MRSA PCR Screening     Status: None   Collection Time: 08/29/16  6:20 AM  Result Value Ref Range Status   MRSA by PCR NEGATIVE NEGATIVE Final    Comment:        The GeneXpert MRSA Assay (FDA approved for NASAL specimens only), is one component of a comprehensive MRSA colonization surveillance program. It is not intended to diagnose MRSA infection nor to guide or monitor treatment for MRSA infections.     RADIOLOGY:  Ct Abdomen Pelvis Wo Contrast  Result Date: 08/28/2016 CLINICAL DATA:  Nausea and vomiting for 2 days. Right lower quadrant pain. EXAM: CT ABDOMEN AND PELVIS WITHOUT CONTRAST TECHNIQUE: Multidetector CT imaging of the abdomen and pelvis was performed following the standard protocol without IV contrast. COMPARISON:  04/19/2010 FINDINGS: Lower chest: Coronary artery  calcification is noted. Hepatobiliary: No focal abnormality in the liver on this study without intravenous  contrast. There is no evidence for gallstones, gallbladder wall thickening, or pericholecystic fluid. No intrahepatic or extrahepatic biliary dilation. Pancreas: No focal mass lesion. No dilatation of the main duct. No intraparenchymal cyst. No peripancreatic edema. Spleen: No splenomegaly. No focal mass lesion. Adrenals/Urinary Tract: No adrenal nodule or mass. 1.6 cm exophytic lesion upper pole left kidney approaches water attenuation and may be a cyst. 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 No hydronephrosis on either side. Ureters are unremarkable. The urinary bladder appears normal for the degree of distention. Stomach/Bowel: Stomach is nondistended. No gastric wall thickening. No evidence of outlet obstruction. Duodenum is normally positioned as is the ligament of Treitz. No small bowel wall thickening. No small bowel dilatation. The terminal ileum is normal. The appendix is normal. No gross colonic mass. No colonic wall thickening. No substantial diverticular change. Mild circumferential wall thickening is noted in the rectum (image 69 series 2). Vascular/Lymphatic: There is abdominal aortic atherosclerosis without aneurysm. There is no gastrohepatic or hepatoduodenal ligament lymphadenopathy. No intraperitoneal or retroperitoneal lymphadenopathy. No pelvic sidewall lymphadenopathy. Reproductive: The uterus has normal CT imaging appearance. There is no adnexal mass. Other: No intraperitoneal free fluid. Musculoskeletal: Bone windows reveal no worrisome lytic or sclerotic osseous lesions. IMPRESSION: 1. No acute findings in the abdomen or pelvis. No findings to explain the patient's history of nausea and vomiting with right lower quadrant pain. Terminal ileum and appendix are normal. No right adnexal mass. 2. 16 mm exophytic lesion upper pole left kidney is likely a cyst but cannot be definitively characterized. Follow-up nonemergent MRI without with contrast could be firm. 3.  Aortic  Atherosclerois (ICD10-170.0) Electronically Signed   By: Kennith Center M.D.   On: 08/28/2016 20:44   Dg Chest Port 1 View  Result Date: 08/28/2016 CLINICAL DATA:  Nausea and vomiting for 2 days EXAM: PORTABLE CHEST 1 VIEW COMPARISON:  02/25/2016 FINDINGS: Stable borderline heart size. Negative mediastinal contours accounting for rotation. No acute infiltrate or edema. No effusion or pneumothorax. Symmetric hazy density at the apices, likely pleural thickening. Thoracic levocurvature and cervical spine fixation hardware. IMPRESSION: No evidence of active disease. Electronically Signed   By: Marnee Spring M.D.   On: 08/28/2016 20:23     Management plans discussed with the patient, family and they are in agreement.  CODE STATUS:     Code Status Orders        Start     Ordered   08/29/16 0239  Full code  Continuous     08/29/16 0238    Code Status History    Date Active Date Inactive Code Status Order ID Comments User Context   02/25/2016  9:00 AM 02/28/2016  6:55 PM Full Code [id number]  Arnaldo Natal, MD Inpatient   10/31/2015 11:55 PM 11/07/2015  2:39 PM Full Code [id number]  Hillary Bow, DO ED    Advance Directive Documentation     Most Recent Value  Type of Advance Directive  Healthcare Power of Attorney  Pre-existing out of facility DNR order (yellow form or pink MOST form)  -  "MOST" Form in Place?  -      TOTAL TIME TAKING CARE OF THIS PATIENT: 40 minutes.    Anyjah Roundtree M.D on 08/30/2016 at 9:01 AM  Between 7am to 6pm - Pager - [phone] After 6pm go to [email or web address] - Social research officer, government  [employer]  [phone]  CC: Primary care physician; Laurence Aly, MD

## 2016-08-30 NOTE — Progress Notes (Signed)
Pt picked up by EMS at 2111.  Belongings and d/c papers sent with patient.

## 2016-08-30 NOTE — Care Management Obs Status (Signed)
MEDICARE OBSERVATION STATUS NOTIFICATION   Patient Details  Name: Woodroe ModeCindy S Hollingshed MRN: [id number] Date of Birth: July 12, 1950   Medicare Observation Status Notification Given:  Yes    Gwenette GreetBrenda S Lazaria Schaben, RN 08/30/2016, 12:28 PM

## 2016-08-30 NOTE — Progress Notes (Signed)
Pt to  Return to [facility] via ems this pm.  Alert. No resp  Distress.   Report called to lisa at [facility].  Sl x2 d/cd.   Pt in transfer pack. vss.

## 2016-08-31 LAB — URINE CULTURE

## 2016-09-02 LAB — CULTURE, BLOOD (ROUTINE X 2)
CULTURE: NO GROWTH
Culture: NO GROWTH
Special Requests: ADEQUATE

## 2016-09-10 NOTE — Progress Notes (Signed)
   08/29/16 1400  PT Time Calculation  PT Start Time (ACUTE ONLY) 1418  PT Stop Time (ACUTE ONLY) 1438  PT Time Calculation (min) (ACUTE ONLY) 20 min  PT G-Codes **NOT FOR INPATIENT CLASS**  Functional Assessment Tool Used AM-PAC 6 Clicks Basic Mobility  Functional Limitation Mobility: Walking and moving around  Mobility: Walking and Moving Around Current Status (W0981(G8978) CN  Mobility: Walking and Moving Around Goal Status (X9147(G8979) CJ  PT General Charges  $$ ACUTE PT VISIT 1 Procedure  PT Evaluation  $PT Eval Low Complexity 1 Procedure   Late-entry g-codes added after review of initial evaluation/documentation completed by Kriste BasqueJulie Banta, PT.  Tommy RainwaterKristen H. Manson PasseyBrown, PT, DPT, NCS 09/10/16, 11:11 AM [phone]

## 2017-05-07 ENCOUNTER — Other Ambulatory Visit: Payer: Self-pay

## 2017-05-07 ENCOUNTER — Encounter: Payer: Self-pay | Admitting: Emergency Medicine

## 2017-05-07 ENCOUNTER — Emergency Department
Admission: EM | Admit: 2017-05-07 | Discharge: 2017-05-07 | Disposition: A | Discharge status: Home / Self Care | Payer: Medicare (Managed Care) | Attending: Emergency Medicine | Admitting: Emergency Medicine

## 2017-05-07 ENCOUNTER — Emergency Department: Payer: Medicare (Managed Care)

## 2017-05-07 DIAGNOSIS — Y9389 Activity, other specified: Secondary | ICD-10-CM | POA: Diagnosis not present

## 2017-05-07 DIAGNOSIS — W228XXA Striking against or struck by other objects, initial encounter: Secondary | ICD-10-CM | POA: Insufficient documentation

## 2017-05-07 DIAGNOSIS — Z79899 Other long term (current) drug therapy: Secondary | ICD-10-CM | POA: Insufficient documentation

## 2017-05-07 DIAGNOSIS — Z7982 Long term (current) use of aspirin: Secondary | ICD-10-CM | POA: Diagnosis not present

## 2017-05-07 DIAGNOSIS — Y92511 Restaurant or cafe as the place of occurrence of the external cause: Secondary | ICD-10-CM | POA: Diagnosis not present

## 2017-05-07 DIAGNOSIS — J45909 Unspecified asthma, uncomplicated: Secondary | ICD-10-CM | POA: Diagnosis not present

## 2017-05-07 DIAGNOSIS — E119 Type 2 diabetes mellitus without complications: Secondary | ICD-10-CM | POA: Diagnosis not present

## 2017-05-07 DIAGNOSIS — I10 Essential (primary) hypertension: Secondary | ICD-10-CM | POA: Insufficient documentation

## 2017-05-07 DIAGNOSIS — T17320A Food in larynx causing asphyxiation, initial encounter: Secondary | ICD-10-CM | POA: Insufficient documentation

## 2017-05-07 DIAGNOSIS — Z87891 Personal history of nicotine dependence: Secondary | ICD-10-CM | POA: Diagnosis not present

## 2017-05-07 DIAGNOSIS — Z7984 Long term (current) use of oral hypoglycemic drugs: Secondary | ICD-10-CM | POA: Diagnosis not present

## 2017-05-07 DIAGNOSIS — Y999 Unspecified external cause status: Secondary | ICD-10-CM | POA: Diagnosis not present

## 2017-05-07 NOTE — ED Notes (Signed)
Pt daughter assisted this RN to get pt into wheelchair upon discharge. Pt daughter verbalized understanding of discharge instructions and follow-up care. VSS. Skin warm and dry. At baseline cognition, per daughter. Pt tolerating PO liquids.

## 2017-05-07 NOTE — Discharge Instructions (Addendum)
As we discussed please be very careful eating or drinking.  If you develop a fever cough or shortness of breath please return to the emergency department for repeat chest imaging or follow-up with your doctor.  Return to the emergency department for any difficulty breathing, chest or abdominal pain, or any other symptom personally concerning to yourself.

## 2017-05-07 NOTE — ED Triage Notes (Signed)
Patient presents to Emergency Department via EMS with complaints of chocking on food.  Per EMS pt was [place] [employer] [place] granddaughter[place] when she choked on a quesadilla and a bystander performed the Heimlich maneuver.    Blood and food particles noted in mouth, only top of dentures present.  Unable to locate source of blood.  Pt hx of 2 x hemorrhagic stroke, with significant left side paralysis (devices and contractures), HTN, DM.  Pt slowly follows commands and is alert. NAD.

## 2017-05-07 NOTE — ED Notes (Signed)
Pt tolerating fluids by mouth. No coughing. Pt states she feels "normal again." MD aware.

## 2017-05-07 NOTE — ED Provider Notes (Signed)
[facility] Emergency Department Provider Note  Time seen: 8:23 PM  I have reviewed the triage vital signs and the nursing notes.   HISTORY  Chief Complaint Choking    HPI Tammy Shepard is a 67 y.o. female with a past medical history of asthma, CVA, diabetes, hypertension, hyperlipidemia, presents to the emergency department after choking episode.  According to report patient was at a [place] when she choked on a quesadilla.  Reportedly received Heimlich which freed the obstruction.  Patient was also noted to spit out some blood as well.  Upon arrival patient appears well, answering all questions appropriately.  Denies any pain in her chest or abdomen, denies any complaints at all.  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  Awaiting family arrival for further history.   Past Medical History:  Diagnosis Date  . Asthma   . CVA (cerebrovascular accident due to intracerebral hemorrhage) (HCC)   . Diabetes mellitus without complication (HCC)   . Hyperlipemia   . Hypertension   . Seizures (HCC)   . Stroke [facility])     Patient Active Problem List   Diagnosis Date Noted  . Sepsis secondary to UTI (HCC) 08/29/2016  . Sepsis (HCC) 02/25/2016  . H/O: CVA (cerebrovascular accident) 11/04/2015  . Hypotension 11/04/2015  . Essential hypertension, benign 11/04/2015  . Fever in adult   . Generalized weakness   . 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 Fever 10/31/2015  . UTI (lower urinary tract infection) 10/31/2015    Past Surgical History:  Procedure Laterality Date  . BRAIN SURGERY    . CERVICAL FUSION      Prior to Admission medications   Medication Sig Start Date End Date Taking? Authorizing Provider  albuterol (PROVENTIL HFA;VENTOLIN HFA) 108 (90 Base) MCG/ACT inhaler Inhale 2 puffs into the lungs daily as needed for shortness of breath. 11/04/12   [provider]  albuterol (PROVENTIL) (2.5 MG/3ML) 0.083% nebulizer solution Take 2.5 mg by  nebulization every 8 (eight) hours as needed for shortness of breath. 11/09/13   [provider]  amLODipine (NORVASC) 10 MG tablet Take 10 mg by mouth daily. 07/06/13   [provider]  ARIPiprazole (ABILIFY) 10 MG tablet Take 10 mg by mouth daily.    [provider]  aspirin EC 81 MG tablet Take 81 mg by mouth daily.    [provider]  atorvastatin (LIPITOR) 40 MG tablet Take 40 mg by mouth every morning.    [provider]  cephALEXin (KEFLEX) 500 MG capsule Take 1 capsule (500 mg total) by mouth every 12 (twelve) hours. 08/30/16   Enedina Finner, MD  clonazePAM (KLONOPIN) 0.5 MG tablet Take 1 tablet (0.5 mg total) by mouth 2 (two) times daily as needed for anxiety. 02/28/16   Adrian Saran, MD  cloNIDine (CATAPRES) 0.3 MG tablet Take 0.3 mg by mouth 2 (two) times daily.    [provider]  Cyanocobalamin (RA VITAMIN B-12 TR) 1000 MCG TBCR Take 1,000 mcg by mouth daily.    [provider]  divalproex (DEPAKOTE SPRINKLE) 125 MG capsule Take 4 capsules (500 mg total) by mouth daily. Patient not taking: Reported on 08/30/2016 02/29/16   Adrian Saran, MD  divalproex (DEPAKOTE SPRINKLE) 125 MG capsule Take 6 capsules (750 mg total) by mouth daily at 10 pm. Patient not taking: Reported on 08/30/2016 02/28/16   Adrian Saran, MD  FLUoxetine (PROZAC) 40 MG capsule Take 40 mg by mouth daily.    [provider]  levothyroxine (SYNTHROID, LEVOTHROID) 100 MCG tablet Take 100 mcg by mouth daily before breakfast.  09/03/13   [provider]  losartan (COZAAR) 50 MG tablet Take 50 mg by mouth 2 (two) times daily. 10/27/15   [provider]  metFORMIN (GLUCOPHAGE) 500 MG tablet Take 500 mg by mouth 2 (two) times daily. 10/16/15   [provider]  omeprazole (PRILOSEC) 20 MG capsule Take 20 mg by mouth daily. 09/18/15   [provider]  oxybutynin (DITROPAN) 5 MG tablet Take 10 mg by mouth 2 (two) times daily. 01/10/14    [provider]  oxyCODONE (OXY IR/ROXICODONE) 5 MG immediate release tablet Take 1 tablet (5 mg total) by mouth daily as needed for moderate pain or severe pain. 02/28/16   Adrian Saran, MD  oxyCODONE (OXYCONTIN) 10 mg 12 hr tablet Take 1 tablet (10 mg total) by mouth every 12 (twelve) hours. 02/28/16   Adrian Saran, MD  polyethylene glycol (MIRALAX / GLYCOLAX) packet Take 17 g by mouth daily.    [provider]  senna (SENOKOT) 8.6 MG tablet Take 2 tablets by mouth 2 (two) times daily as needed for constipation. 06/05/13   [provider]    Allergies  Allergen Reactions  . Amoxicillin Rash    Tolerating rocephin 10/29/15  . Ciprofloxacin Anaphylaxis  . Clindamycin Hcl Swelling, Rash and Other (See Comments)    Other Reaction: BAD RASH AND SWELLING, SKIN BR Severe rash and swelling. Skin break down.  Marland Kitchen Penicillins Anaphylaxis    Tolerating rocephin 11/01/15  . Codeine Nausea And Vomiting  . Baclofen Itching and Rash  . Contrast Media [Iodinated Diagnostic Agents] Itching and Rash  . Erythromycin Rash  . Iodides Rash    Povidone iodine; betadine  . Latex Itching and Rash  . Nickel Rash and Other (See Comments)    OTHER REACTION  . Soap Itching and Rash  . Sulfa Antibiotics Rash  . Tape Rash    (silk tape only) skin break down  . Tolterodine Rash  . Vancomycin Rash    History reviewed. No pertinent family history.  Social History Social History   Tobacco Use  . Smoking status: Former Games developer  . Smokeless tobacco: Never Used  Substance Use Topics  . Alcohol use: No  . Drug use: No    Review of Systems Constitutional: Negative for fever.  Did not lose consciousness. ENT: Did spit out some blood.  Denies any oral injuries. Cardiovascular: Negative for chest pain. Respiratory: Negative for shortness of breath. Gastrointestinal: Negative for abdominal pain All other ROS negative  ____________________________________________   PHYSICAL  EXAM:  VITAL SIGNS: ED Triage Vitals  Enc Vitals Group     BP 05/07/17 1955 113/62     Pulse Rate 05/07/17 1955 91     Resp 05/07/17 1955 18     Temp 05/07/17 1955 98.3 F (36.8 C)     Temp Source 05/07/17 1955 Oral     SpO2 05/07/17 1950 100 %     Weight 05/07/17 1956 159 lb (72.1 kg)     Height 05/07/17 1956 5\' 7"  (1.702 m)     Head Circumference --      Peak Flow --      Pain Score 05/07/17 1956 0     Pain Loc --      Pain Edu? --      Excl. in GC? --     Constitutional: Alert and oriented. Well appearing and in no distress. Eyes: Normal exam  ENT   Head: Normocephalic and atraumatic.   Mouth/Throat: Mucous membranes are moist.  Small amount of dried blood around her lips.  No intraoral injuries identified. Cardiovascular: Normal rate, regular rhythm.  Respiratory: Normal respiratory effort without tachypnea nor retractions. Breath sounds are clear.  No wheeze rales or rhonchi.  No chest wall tenderness to palpation. Gastrointestinal: Soft and nontender. No distention.  Nontender abdominal exam. Musculoskeletal: Contracted left upper extremity, chronic.  Left lower extremity paralysis, chronic. Neurologic:  Normal speech and language. No gross focal neurologic deficits  Skin:  Skin is warm, dry and intact.  Psychiatric: Mood and affect are normal.   ____________________________________________   RADIOLOGY  Chest x-ray is normal.  ____________________________________________   INITIAL IMPRESSION / ASSESSMENT AND PLAN / ED COURSE  Pertinent labs & imaging results that were available during my care of the patient were reviewed by me and considered in my medical decision making (see chart for details).  Patient presents emergency department after choking episode.  Differential would include choking now resolved, aspiration, abdominal or chest injury.  Overall the patient appears very well, denies any chest pain or abdominal pain.  Denies any trouble breathing.   Satting 99% on room air with a normal respiratory rate normal pulse rate.  Afebrile.  Patient did have a slight amount of dried blood around her mouth, is not exactly clear the etiology of this blood.  Patient has been able to tolerate fluids in the emergency department without any difficulty.  She has a completely benign abdominal exam nontender epigastrium and nontender chest.  Possible Mallory-Weiss tear due to force full Heimlich.  With no further bleeding episodes I do not believe invasive evaluation is needed at this time.  Family is here with the patient, they will be taking her home.  Family patient agreeable to this plan of care.  ____________________________________________   FINAL CLINICAL IMPRESSION(S) / ED DIAGNOSES  Choking, now resolved    Minna AntisPaduchowski, Kadden Osterhout, MD 05/07/17 2118

## 2019-08-06 ENCOUNTER — Emergency Department: Payer: Medicare (Managed Care)

## 2019-08-06 ENCOUNTER — Other Ambulatory Visit: Payer: Self-pay

## 2019-08-06 ENCOUNTER — Emergency Department
Admission: EM | Admit: 2019-08-06 | Discharge: 2019-08-06 | Disposition: A | Discharge status: Skilled Nursing Facility | Payer: Medicare (Managed Care) | Attending: Student in an Organized Health Care Education/Training Program | Admitting: Student in an Organized Health Care Education/Training Program

## 2019-08-06 DIAGNOSIS — Z7984 Long term (current) use of oral hypoglycemic drugs: Secondary | ICD-10-CM | POA: Diagnosis not present

## 2019-08-06 DIAGNOSIS — R29810 Facial weakness: Secondary | ICD-10-CM | POA: Diagnosis not present

## 2019-08-06 DIAGNOSIS — E119 Type 2 diabetes mellitus without complications: Secondary | ICD-10-CM | POA: Insufficient documentation

## 2019-08-06 DIAGNOSIS — I10 Essential (primary) hypertension: Secondary | ICD-10-CM | POA: Insufficient documentation

## 2019-08-06 DIAGNOSIS — Z7982 Long term (current) use of aspirin: Secondary | ICD-10-CM | POA: Insufficient documentation

## 2019-08-06 DIAGNOSIS — Z79899 Other long term (current) drug therapy: Secondary | ICD-10-CM | POA: Insufficient documentation

## 2019-08-06 DIAGNOSIS — J45909 Unspecified asthma, uncomplicated: Secondary | ICD-10-CM | POA: Diagnosis not present

## 2019-08-06 DIAGNOSIS — Z91040 Latex allergy status: Secondary | ICD-10-CM | POA: Diagnosis not present

## 2019-08-06 DIAGNOSIS — Z87891 Personal history of nicotine dependence: Secondary | ICD-10-CM | POA: Insufficient documentation

## 2019-08-06 LAB — URINALYSIS, COMPLETE (UACMP) WITH MICROSCOPIC
Bilirubin Urine: NEGATIVE
Glucose, UA: NEGATIVE mg/dL
Ketones, ur: 5 mg/dL — AB
Nitrite: POSITIVE — AB
Protein, ur: NEGATIVE mg/dL
Specific Gravity, Urine: 1.012 (ref 1.005–1.030)
Squamous Epithelial / HPF: NONE SEEN (ref 0–5)
pH: 6 (ref 5.0–8.0)

## 2019-08-06 LAB — COMPREHENSIVE METABOLIC PANEL
ALT: 14 U/L (ref 0–44)
AST: 15 U/L (ref 15–41)
Albumin: 3.5 g/dL (ref 3.5–5.0)
Alkaline Phosphatase: 48 U/L (ref 38–126)
Anion gap: 12 (ref 5–15)
BUN: 15 mg/dL (ref 8–23)
CO2: 23 mmol/L (ref 22–32)
Calcium: 8.9 mg/dL (ref 8.9–10.3)
Chloride: 105 mmol/L (ref 98–111)
Creatinine, Ser: 0.79 mg/dL (ref 0.44–1.00)
GFR calc Af Amer: >60 mL/min (ref >60)
GFR calc non Af Amer: >60 mL/min (ref >60)
Glucose, Bld: 89 mg/dL (ref 70–99)
Potassium: 4.5 mmol/L (ref 3.5–5.1)
Sodium: 140 mmol/L (ref 135–145)
Total Bilirubin: 0.7 mg/dL (ref 0.3–1.2)
Total Protein: 6.4 g/dL — ABNORMAL LOW (ref 6.5–8.1)

## 2019-08-06 LAB — CBC WITH DIFFERENTIAL/PLATELET
Abs Immature Granulocytes: 0.05 10*3/uL (ref 0.00–0.07)
Basophils Absolute: 0 10*3/uL (ref 0.0–0.1)
Basophils Relative: 1 %
Eosinophils Absolute: 0.1 10*3/uL (ref 0.0–0.5)
Eosinophils Relative: 1 %
HCT: 40.5 % (ref 36.0–46.0)
Hemoglobin: 13.8 g/dL (ref 12.0–15.0)
Immature Granulocytes: 1 %
Lymphocytes Relative: 33 %
Lymphs Abs: 2.4 10*3/uL (ref 0.7–4.0)
MCH: 33.1 pg (ref 26.0–34.0)
MCHC: 34.1 g/dL (ref 30.0–36.0)
MCV: 97.1 fL (ref 80.0–100.0)
Monocytes Absolute: 0.7 10*3/uL (ref 0.1–1.0)
Monocytes Relative: 10 %
Neutro Abs: 3.9 10*3/uL (ref 1.7–7.7)
Neutrophils Relative %: 54 %
Platelets: 197 10*3/uL (ref 150–400)
RBC: 4.17 MIL/uL (ref 3.87–5.11)
RDW: 14 % (ref 11.5–15.5)
WBC: 7.2 10*3/uL (ref 4.0–10.5)
nRBC: 0 % (ref 0.0–0.2)

## 2019-08-06 NOTE — ED Provider Notes (Signed)
[facility] Emergency Department Provider Note    First MD Initiated Contact with Patient 08/06/19 1343     (approximate)  I have reviewed the triage vital signs and the nursing notes.   HISTORY  Chief Complaint Facial Droop    HPI Tammy Shepard is a 69 y.o. female below listed past medical history presents to the ER due to concern for left facial droop that occurred around 1130 this morning.  States she was also having a headache during that time and symptoms lasted roughly 20 minutes.  Feels like she is back at her baseline.  Does have remote history of stroke as well as seizures.  She is presenting from nursing facility.  She feels like she is back at her baseline.  Has been compliant with her medications.  Denies any nausea or vomiting.  No chest pain or shortness of breath.    Past Medical History:  Diagnosis Date  . Asthma   . CVA (cerebrovascular accident due to intracerebral hemorrhage) (HCC)   . Diabetes mellitus without complication (HCC)   . Hyperlipemia   . Hypertension   . Seizures (HCC)   . Stroke [facility])    No family history on file. Past Surgical History:  Procedure Laterality Date  . BRAIN SURGERY    . CERVICAL FUSION     Patient Active Problem List   Diagnosis Date Noted  . Sepsis secondary to UTI (HCC) 08/29/2016  . Sepsis (HCC) 02/25/2016  . H/O: CVA (cerebrovascular accident) 11/04/2015  . Hypotension 11/04/2015  . Essential hypertension, benign 11/04/2015  . Fever in adult   . Generalized weakness   . 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 Fever 10/31/2015  . UTI (lower urinary tract infection) 10/31/2015      Prior to Admission medications   Medication Sig Start Date End Date Taking? Authorizing Provider  amLODipine (NORVASC) 10 MG tablet Take 10 mg by mouth daily. 07/06/13  Yes [provider]  ARIPiprazole (ABILIFY) 5 MG tablet Take 5 mg by mouth daily.    Yes [provider]  aspirin 81 MG chewable  tablet Chew 81 mg by mouth daily.   Yes [provider]  atorvastatin (LIPITOR) 40 MG tablet Take 40 mg by mouth every morning.   Yes [provider]  clonazePAM (KLONOPIN) 0.5 MG tablet Take 0.5 mg by mouth 2 (two) times daily.   Yes [provider]  divalproex (DEPAKOTE SPRINKLE) 125 MG capsule Take 500 mg by mouth daily.   Yes [provider]  divalproex (DEPAKOTE SPRINKLE) 125 MG capsule Take 750 mg by mouth at bedtime.   Yes [provider]  FLUoxetine (PROZAC) 40 MG capsule Take 40 mg by mouth 2 (two) times daily.    Yes [provider]  levothyroxine (SYNTHROID) 150 MCG tablet Take 150 mcg by mouth daily before breakfast.    Yes [provider]  losartan (COZAAR) 50 MG tablet Take 50 mg by mouth 2 (two) times daily. 10/27/15  Yes [provider]  metFORMIN (GLUCOPHAGE) 500 MG tablet Take 500 mg by mouth 2 (two) times daily. 10/16/15  Yes [provider]  metoprolol tartrate (LOPRESSOR) 25 MG tablet Take 25 mg by mouth 2 (two) times daily.   Yes [provider]  Multiple Vitamin (MULTIVITAMIN WITH MINERALS) TABS tablet Take 1 tablet by mouth daily.   Yes [provider]  nitroGLYCERIN (NITROSTAT) 0.4 MG SL tablet Place 0.4 mg under the tongue every 5 (five) minutes as needed  for chest pain.   Yes [provider]  omeprazole (PRILOSEC) 20 MG capsule Take 20 mg by mouth daily. 09/18/15  Yes [provider]  oxyCODONE-acetaminophen (PERCOCET/ROXICET) 5-325 MG tablet Take 1 tablet by mouth 2 (two) times daily.   Yes [provider]  polyethylene glycol (MIRALAX / GLYCOLAX) packet Take 17 g by mouth daily.   Yes [provider]  senna (SENOKOT) 8.6 MG tablet Take 2 tablets by mouth 2 (two) times daily as needed for constipation. 06/05/13  Yes [provider]    Allergies Amoxicillin, Ciprofloxacin, Clindamycin hcl, Penicillins, Codeine, Baclofen, Contrast  media [iodinated diagnostic agents], Erythromycin, Iodides, Latex, Nickel, Soap, Sulfa antibiotics, Tape, Tolterodine, and Vancomycin    Social History Social History   Tobacco Use  . Smoking status: Former Games developer  . Smokeless tobacco: Never Used  Substance Use Topics  . Alcohol use: No  . Drug use: No    Review of Systems Patient denies headaches, rhinorrhea, blurry vision, numbness, shortness of breath, chest pain, edema, cough, abdominal pain, nausea, vomiting, diarrhea, dysuria, fevers, rashes or hallucinations unless otherwise stated above in HPI. ____________________________________________   PHYSICAL EXAM:  VITAL SIGNS: Vitals:   08/06/19 1350  BP: 128/65  Pulse: 64  Resp: 18  Temp: 97.7 F (36.5 C)  SpO2: 96%    Constitutional: Alert, chronically ill appearing. oriented.  Eyes: Conjunctivae are normal.  Head: Atraumatic. Nose: No congestion/rhinnorhea. Mouth/Throat: Mucous membranes are moist.   Neck: No stridor. Painless ROM.  Cardiovascular: Normal rate, regular rhythm. Grossly normal heart sounds.  Good peripheral circulation. Respiratory: Normal respiratory effort.  No retractions. Lungs CTAB. Gastrointestinal: Soft and nontender. No distention. No abdominal bruits. No CVA tenderness. Genitourinary: defered Musculoskeletal: No lower extremity tenderness nor edema.  No joint effusions. Neurologic:  Normal speech and language. Chronic spastic paralysis of LUE,  CNI, no acute focal deficits as compared to baseline Skin:  Skin is warm, dry and intact. No rash noted. Psychiatric: Mood and affect are normal. Speech and behavior are normal.  ____________________________________________   LABS (all labs ordered are listed, but only abnormal results are displayed)  Results for orders placed or performed during the hospital encounter of 08/06/19 (from the past 24 hour(s))  CBC WITH DIFFERENTIAL     Status: None   Collection Time: 08/06/19  1:51 PM  Result  Value Ref Range   WBC 7.2 4.0 - 10.5 K/uL   RBC 4.17 3.87 - 5.11 MIL/uL   Hemoglobin 13.8 12.0 - 15.0 g/dL   HCT 66.0 36 - 46 %   MCV 97.1 80.0 - 100.0 fL   MCH 33.1 26.0 - 34.0 pg   MCHC 34.1 30.0 - 36.0 g/dL   RDW 63.0 16.0 - 10.9 %   Platelets 197 150 - 400 K/uL   nRBC 0.0 0.0 - 0.2 %   Neutrophils Relative % 54 %   Neutro Abs 3.9 1.7 - 7.7 K/uL   Lymphocytes Relative 33 %   Lymphs Abs 2.4 0.7 - 4.0 K/uL   Monocytes Relative 10 %   Monocytes Absolute 0.7 0 - 1 K/uL   Eosinophils Relative 1 %   Eosinophils Absolute 0.1 0 - 0 K/uL   Basophils Relative 1 %   Basophils Absolute 0.0 0 - 0 K/uL   Immature Granulocytes 1 %   Abs Immature Granulocytes 0.05 0.00 - 0.07 K/uL  Comprehensive metabolic panel     Status: Abnormal   Collection Time: 08/06/19  1:51 PM  Result Value Ref Range  Sodium 140 135 - 145 mmol/L   Potassium 4.5 3.5 - 5.1 mmol/L   Chloride 105 98 - 111 mmol/L   CO2 23 22 - 32 mmol/L   Glucose, Bld 89 70 - 99 mg/dL   BUN 15 8 - 23 mg/dL   Creatinine, Ser 1.85 0.44 - 1.00 mg/dL   Calcium 8.9 8.9 - 63.1 mg/dL   Total Protein 6.4 (L) 6.5 - 8.1 g/dL   Albumin 3.5 3.5 - 5.0 g/dL   AST 15 15 - 41 U/L   ALT 14 0 - 44 U/L   Alkaline Phosphatase 48 38 - 126 U/L   Total Bilirubin 0.7 0.3 - 1.2 mg/dL   GFR calc non Af Amer >60 >60 mL/min   GFR calc Af Amer >60 >60 mL/min   Anion gap 12 5 - 15   ____________________________________________  EKG My review and personal interpretation at Time: 13:50   Indication: weakness  Rate: 65  Rhythm: sinus Axis: normal Other: nonspecific st abn, no stemi ____________________________________________  RADIOLOGY  I personally reviewed all radiographic images ordered to evaluate for the above acute complaints and reviewed radiology reports and findings.  These findings were personally discussed with the patient.  Please see medical record for radiology  report.  ____________________________________________   PROCEDURES  Procedure(s) performed:  Procedures    Critical Care performed: no ____________________________________________   INITIAL IMPRESSION / ASSESSMENT AND PLAN / ED COURSE  Pertinent labs & imaging results that were available during my care of the patient were reviewed by me and considered in my medical decision making (see chart for details).   DDX: cva, tia, hypoglycemia, dehydration, electrolyte abnormality, dissection, sepsis   BETSAIDA MISSOURI is a 69 y.o. who presents to the ED with symptoms as described above.  Does not have any objective focal neuro deficits at this time.  Left facial droop reported earlier today was on the side from previous CVA.  Did not lose consciousness.  Does not seem consistent with seizure.  Is not consistent with code stroke as her symptoms have resolved.  Will order CT as well as basic blood work and reassess.     ----------------------------------------- 3:15 PM on 08/06/2019 -----------------------------------------  Blood work is reassuring.  CT imaging is stable.  She does not have any new focal deficits.  Still awaiting urinalysis.  Anticipate patient will be appropriate for discharge home.  Patient will be signed out to oncombing physician.  The patient was evaluated in Emergency Department today for the symptoms described in the history of present illness. He/she was evaluated in the context of the global COVID-19 pandemic, which necessitated consideration that the patient might be at risk for infection with the SARS-CoV-2 virus that causes COVID-19. Institutional protocols and algorithms that pertain to the evaluation of patients at risk for COVID-19 are in a state of rapid change based on information released by regulatory bodies including the CDC and federal and state organizations. These policies and algorithms were followed during the patient's care in the ED.  As part of my  medical decision making, I reviewed the following data within the electronic MEDICAL RECORD NUMBER Nursing notes reviewed and incorporated, Labs reviewed, notes from prior ED visits and [place] Controlled Substance Database   ____________________________________________   FINAL CLINICAL IMPRESSION(S) / ED DIAGNOSES  Final diagnoses:  Facial droop      NEW MEDICATIONS STARTED DURING THIS VISIT:  New Prescriptions   No medications on file     Note:  This document was  prepared using Systems analyst and may include unintentional dictation errors.    Merlyn Lot, MD 08/06/19 [phone]

## 2019-08-06 NOTE — ED Triage Notes (Signed)
Pt via EMS from [facility]. Pt's NP at [employer] [facility] that the pt had a worsen facial droop than normal. Pt had a stroke in 1988 that left her with L sided deficits. Pt A&Ox4 and NAD. LKW 1120 this am. Pt also c/o a R-sided posterior headache.

## 2019-08-06 NOTE — ED Notes (Signed)
Spoke with Era at [facility] upon pending d/c.

## 2019-08-06 NOTE — ED Notes (Signed)
Attempted to call pt's daughter, Clayborne Artist, two unsuccessful attempts.

## 2019-08-09 LAB — URINE CULTURE: Culture: 80000 — AB

## 2019-08-10 NOTE — Consult Note (Addendum)
Attempted to call the only number listed on patient's chart. No answer. Voicemail not set up. Attempted to reach the pharmacy that was listed on the patient's chart, and they also did not pick up the phone. Called listed PCP on patient's chart with left voice message. Will try again tomorrow. Discussed case with Dr. Roxan Hockey. Plan is to call in prescription for nitrofurantoin 500 mg BID x 5 days.  PCP listed on patient's chart called back and stated patient does not see them anymore. Last saw in 2018. I called [facility] and will fax the urine culture results there (fax #[phone]). The PCP will handle the abx prescription.   Thanks,  Paschal Dopp, PharmD, BCPS

## 2020-09-18 ENCOUNTER — Inpatient Hospital Stay (HOSPITAL_COMMUNITY): Payer: Medicare (Managed Care)

## 2020-09-18 ENCOUNTER — Inpatient Hospital Stay: Payer: Medicare (Managed Care)

## 2020-09-18 ENCOUNTER — Encounter: Payer: Self-pay | Admitting: Intensive Care

## 2020-09-18 ENCOUNTER — Emergency Department: Payer: Medicare (Managed Care)

## 2020-09-18 ENCOUNTER — Inpatient Hospital Stay
Admission: EM | Admit: 2020-09-18 | Discharge: 2020-09-22 | DRG: 871 | Disposition: A | Discharge status: Skilled Nursing Facility | Payer: Medicare (Managed Care) | Source: Skilled Nursing Facility | Attending: Internal Medicine | Admitting: Internal Medicine

## 2020-09-18 ENCOUNTER — Other Ambulatory Visit: Payer: Self-pay

## 2020-09-18 DIAGNOSIS — I110 Hypertensive heart disease with heart failure: Secondary | ICD-10-CM | POA: Diagnosis present

## 2020-09-18 DIAGNOSIS — Z7984 Long term (current) use of oral hypoglycemic drugs: Secondary | ICD-10-CM | POA: Diagnosis not present

## 2020-09-18 DIAGNOSIS — Z20822 Contact with and (suspected) exposure to covid-19: Secondary | ICD-10-CM | POA: Diagnosis present

## 2020-09-18 DIAGNOSIS — E119 Type 2 diabetes mellitus without complications: Secondary | ICD-10-CM | POA: Diagnosis present

## 2020-09-18 DIAGNOSIS — J9601 Acute respiratory failure with hypoxia: Secondary | ICD-10-CM | POA: Diagnosis present

## 2020-09-18 DIAGNOSIS — L8990 Pressure ulcer of unspecified site, unspecified stage: Secondary | ICD-10-CM | POA: Insufficient documentation

## 2020-09-18 DIAGNOSIS — I5032 Chronic diastolic (congestive) heart failure: Secondary | ICD-10-CM | POA: Diagnosis present

## 2020-09-18 DIAGNOSIS — J45909 Unspecified asthma, uncomplicated: Secondary | ICD-10-CM | POA: Diagnosis present

## 2020-09-18 DIAGNOSIS — Z7401 Bed confinement status: Secondary | ICD-10-CM | POA: Diagnosis not present

## 2020-09-18 DIAGNOSIS — I82441 Acute embolism and thrombosis of right tibial vein: Secondary | ICD-10-CM | POA: Diagnosis present

## 2020-09-18 DIAGNOSIS — E785 Hyperlipidemia, unspecified: Secondary | ICD-10-CM | POA: Diagnosis present

## 2020-09-18 DIAGNOSIS — R0602 Shortness of breath: Secondary | ICD-10-CM

## 2020-09-18 DIAGNOSIS — Z881 Allergy status to other antibiotic agents status: Secondary | ICD-10-CM

## 2020-09-18 DIAGNOSIS — Z91040 Latex allergy status: Secondary | ICD-10-CM

## 2020-09-18 DIAGNOSIS — Z79899 Other long term (current) drug therapy: Secondary | ICD-10-CM | POA: Diagnosis not present

## 2020-09-18 DIAGNOSIS — G40909 Epilepsy, unspecified, not intractable, without status epilepticus: Secondary | ICD-10-CM | POA: Diagnosis present

## 2020-09-18 DIAGNOSIS — J9621 Acute and chronic respiratory failure with hypoxia: Secondary | ICD-10-CM

## 2020-09-18 DIAGNOSIS — F0150 Vascular dementia without behavioral disturbance: Secondary | ICD-10-CM | POA: Diagnosis present

## 2020-09-18 DIAGNOSIS — E039 Hypothyroidism, unspecified: Secondary | ICD-10-CM | POA: Diagnosis present

## 2020-09-18 DIAGNOSIS — Z888 Allergy status to other drugs, medicaments and biological substances status: Secondary | ICD-10-CM

## 2020-09-18 DIAGNOSIS — Z885 Allergy status to narcotic agent status: Secondary | ICD-10-CM | POA: Diagnosis not present

## 2020-09-18 DIAGNOSIS — E669 Obesity, unspecified: Secondary | ICD-10-CM | POA: Diagnosis present

## 2020-09-18 DIAGNOSIS — Z7982 Long term (current) use of aspirin: Secondary | ICD-10-CM | POA: Diagnosis not present

## 2020-09-18 DIAGNOSIS — Y95 Nosocomial condition: Secondary | ICD-10-CM | POA: Diagnosis present

## 2020-09-18 DIAGNOSIS — Z6834 Body mass index (BMI) 34.0-34.9, adult: Secondary | ICD-10-CM

## 2020-09-18 DIAGNOSIS — I69354 Hemiplegia and hemiparesis following cerebral infarction affecting left non-dominant side: Secondary | ICD-10-CM | POA: Diagnosis not present

## 2020-09-18 DIAGNOSIS — Z882 Allergy status to sulfonamides status: Secondary | ICD-10-CM

## 2020-09-18 DIAGNOSIS — Z79890 Hormone replacement therapy (postmenopausal): Secondary | ICD-10-CM | POA: Diagnosis not present

## 2020-09-18 DIAGNOSIS — Z880 Allergy status to penicillin: Secondary | ICD-10-CM | POA: Diagnosis not present

## 2020-09-18 DIAGNOSIS — Z91041 Radiographic dye allergy status: Secondary | ICD-10-CM

## 2020-09-18 DIAGNOSIS — A411 Sepsis due to other specified staphylococcus: Principal | ICD-10-CM | POA: Diagnosis present

## 2020-09-18 DIAGNOSIS — L89152 Pressure ulcer of sacral region, stage 2: Secondary | ICD-10-CM | POA: Diagnosis present

## 2020-09-18 DIAGNOSIS — J189 Pneumonia, unspecified organism: Secondary | ICD-10-CM | POA: Diagnosis present

## 2020-09-18 LAB — COMPREHENSIVE METABOLIC PANEL
ALT: 12 U/L (ref 0–44)
AST: 17 U/L (ref 15–41)
Albumin: 3.5 g/dL (ref 3.5–5.0)
Alkaline Phosphatase: 56 U/L (ref 38–126)
Anion gap: 7 (ref 5–15)
BUN: 22 mg/dL (ref 8–23)
CO2: 26 mmol/L (ref 22–32)
Calcium: 8.8 mg/dL — ABNORMAL LOW (ref 8.9–10.3)
Chloride: 104 mmol/L (ref 98–111)
Creatinine, Ser: 0.85 mg/dL (ref 0.44–1.00)
GFR, Estimated: >60 mL/min (ref >60)
Glucose, Bld: 129 mg/dL — ABNORMAL HIGH (ref 70–99)
Potassium: 4.1 mmol/L (ref 3.5–5.1)
Sodium: 137 mmol/L (ref 135–145)
Total Bilirubin: 0.6 mg/dL (ref 0.3–1.2)
Total Protein: 7.1 g/dL (ref 6.5–8.1)

## 2020-09-18 LAB — CBC
HCT: 42.3 % (ref 36.0–46.0)
Hemoglobin: 14.7 g/dL (ref 12.0–15.0)
MCH: 34.7 pg — ABNORMAL HIGH (ref 26.0–34.0)
MCHC: 34.8 g/dL (ref 30.0–36.0)
MCV: 99.8 fL (ref 80.0–100.0)
Platelets: 177 10*3/uL (ref 150–400)
RBC: 4.24 MIL/uL (ref 3.87–5.11)
RDW: 14.7 % (ref 11.5–15.5)
WBC: 9.5 10*3/uL (ref 4.0–10.5)
nRBC: 0 % (ref 0.0–0.2)

## 2020-09-18 LAB — LACTIC ACID, PLASMA: Lactic Acid, Venous: 1.2 mmol/L (ref 0.5–1.9)

## 2020-09-18 LAB — BLOOD GAS, ARTERIAL
Acid-Base Excess: 2 mmol/L (ref 0.0–2.0)
Bicarbonate: 26.5 mmol/L (ref 20.0–28.0)
Delivery systems: POSITIVE
Expiratory PAP: 6
FIO2: 0.45
Inspiratory PAP: 12
O2 Saturation: 97.7 %
Patient temperature: 37
pCO2 arterial: 40 mmHg (ref 32.0–48.0)
pH, Arterial: 7.43 (ref 7.350–7.450)
pO2, Arterial: 97 mmHg (ref 83.0–108.0)

## 2020-09-18 LAB — TROPONIN I (HIGH SENSITIVITY)
Troponin I (High Sensitivity): 7 ng/L (ref <18)
Troponin I (High Sensitivity): 15 ng/L (ref <18)

## 2020-09-18 LAB — C-REACTIVE PROTEIN: CRP: 0.8 mg/dL (ref <1.0)

## 2020-09-18 LAB — BRAIN NATRIURETIC PEPTIDE: B Natriuretic Peptide: 20.1 pg/mL (ref 0.0–100.0)

## 2020-09-18 LAB — RESP PANEL BY RT-PCR (FLU A&B, COVID) ARPGX2
Influenza A by PCR: NEGATIVE
Influenza B by PCR: NEGATIVE
SARS Coronavirus 2 by RT PCR: NEGATIVE

## 2020-09-18 LAB — HEMOGLOBIN A1C
Hgb A1c MFr Bld: 5.5 % (ref 4.8–5.6)
Mean Plasma Glucose: 111.15 mg/dL

## 2020-09-18 LAB — D-DIMER, QUANTITATIVE: D-Dimer, Quant: 0.58 ug/mL-FEU — ABNORMAL HIGH (ref 0.00–0.50)

## 2020-09-18 LAB — CBG MONITORING, ED: Glucose-Capillary: 112 mg/dL — ABNORMAL HIGH (ref 70–99)

## 2020-09-18 MED ORDER — PANTOPRAZOLE SODIUM 40 MG IV SOLR
40.0000 mg | Freq: Every day | INTRAVENOUS | Status: DC
  Administered 2020-09-18 – 2020-09-22 (×5): 40 mg via INTRAVENOUS
  Filled 2020-09-18 (×4): qty 40

## 2020-09-18 MED ORDER — CLONAZEPAM 0.5 MG PO TABS: 0.5000 mg | ORAL_TABLET | Freq: Two times a day (BID) | ORAL | Status: DC

## 2020-09-18 MED ORDER — SENNA 8.6 MG PO TABS: 2.0000 | ORAL_TABLET | Freq: Two times a day (BID) | ORAL | Status: DC | PRN

## 2020-09-18 MED ORDER — DIVALPROEX SODIUM 125 MG PO CSDR: 750.0000 mg | DELAYED_RELEASE_CAPSULE | Freq: Every day | ORAL | Status: DC

## 2020-09-18 MED ORDER — PANTOPRAZOLE SODIUM 40 MG PO TBEC: 40.0000 mg | DELAYED_RELEASE_TABLET | Freq: Every day | ORAL | Status: DC

## 2020-09-18 MED ORDER — FUROSEMIDE 10 MG/ML IJ SOLN
60.0000 mg | Freq: Once | INTRAMUSCULAR | Status: AC
  Administered 2020-09-18: 60 mg via INTRAVENOUS
  Filled 2020-09-18: qty 8

## 2020-09-18 MED ORDER — OLANZAPINE 10 MG IM SOLR
2.5000 mg | Freq: Two times a day (BID) | INTRAMUSCULAR | Status: DC | PRN
  Filled 2020-09-18: qty 10

## 2020-09-18 MED ORDER — ADULT MULTIVITAMIN W/MINERALS CH
1.0000 | ORAL_TABLET | Freq: Every day | ORAL | Status: DC
  Administered 2020-09-19 – 2020-09-22 (×4): 1 via ORAL
  Filled 2020-09-18 (×4): qty 1

## 2020-09-18 MED ORDER — FLUOXETINE HCL 20 MG PO CAPS: 40.0000 mg | ORAL_CAPSULE | Freq: Two times a day (BID) | ORAL | Status: DC

## 2020-09-18 MED ORDER — INSULIN ASPART 100 UNIT/ML IJ SOLN: 0.0000 [IU] | Freq: Three times a day (TID) | INTRAMUSCULAR | Status: DC

## 2020-09-18 MED ORDER — DIVALPROEX SODIUM 125 MG PO CSDR
500.0000 mg | DELAYED_RELEASE_CAPSULE | Freq: Two times a day (BID) | ORAL | Status: DC
  Administered 2020-09-19 – 2020-09-22 (×7): 500 mg via ORAL
  Filled 2020-09-18 (×10): qty 4

## 2020-09-18 MED ORDER — SODIUM CHLORIDE 0.9 % IV BOLUS: 1000.0000 mL | Freq: Once | INTRAVENOUS | Status: DC

## 2020-09-18 MED ORDER — ASPIRIN 81 MG PO CHEW
81.0000 mg | CHEWABLE_TABLET | Freq: Every day | ORAL | Status: DC
  Administered 2020-09-19 – 2020-09-21 (×3): 81 mg via ORAL
  Filled 2020-09-18 (×3): qty 1

## 2020-09-18 MED ORDER — LORAZEPAM 2 MG/ML IJ SOLN: 0.5000 mg | Freq: Once | INTRAMUSCULAR | Status: DC

## 2020-09-18 MED ORDER — SODIUM CHLORIDE 0.9 % IV SOLN
100.0000 mg | Freq: Two times a day (BID) | INTRAVENOUS | Status: DC
  Administered 2020-09-18 – 2020-09-20 (×4): 100 mg via INTRAVENOUS
  Filled 2020-09-18 (×5): qty 100

## 2020-09-18 MED ORDER — FENTANYL CITRATE (PF) 100 MCG/2ML IJ SOLN
25.0000 ug | Freq: Once | INTRAMUSCULAR | Status: AC
  Administered 2020-09-18: 25 ug via INTRAVENOUS
  Filled 2020-09-18: qty 2

## 2020-09-18 MED ORDER — LORAZEPAM 2 MG/ML IJ SOLN
0.5000 mg | Freq: Three times a day (TID) | INTRAMUSCULAR | Status: DC | PRN
  Administered 2020-09-19 – 2020-09-21 (×5): 0.5 mg via INTRAVENOUS
  Filled 2020-09-18 (×5): qty 1

## 2020-09-18 MED ORDER — SODIUM CHLORIDE 0.9 % IV SOLN: INTRAVENOUS | Status: AC

## 2020-09-18 MED ORDER — NITROGLYCERIN 0.4 MG SL SUBL: 0.4000 mg | SUBLINGUAL_TABLET | SUBLINGUAL | Status: DC | PRN

## 2020-09-18 MED ORDER — POLYETHYLENE GLYCOL 3350 17 G PO PACK: 17.0000 g | PACK | Freq: Every day | ORAL | Status: DC

## 2020-09-18 MED ORDER — OXYCODONE-ACETAMINOPHEN 5-325 MG PO TABS: 1.0000 | ORAL_TABLET | Freq: Two times a day (BID) | ORAL | Status: DC

## 2020-09-18 MED ORDER — ARIPIPRAZOLE 5 MG PO TABS: 5.0000 mg | ORAL_TABLET | Freq: Every day | ORAL | Status: DC

## 2020-09-18 MED ORDER — RISPERIDONE 1 MG PO TABS: 0.5000 mg | ORAL_TABLET | Freq: Every day | ORAL | Status: DC

## 2020-09-18 MED ORDER — ATORVASTATIN CALCIUM 20 MG PO TABS: 40.0000 mg | ORAL_TABLET | ORAL | Status: DC

## 2020-09-18 MED ORDER — SODIUM CHLORIDE 0.9 % IV SOLN
1.0000 g | INTRAVENOUS | Status: DC
  Administered 2020-09-18 – 2020-09-21 (×4): 1 g via INTRAVENOUS
  Filled 2020-09-18 (×3): qty 10
  Filled 2020-09-18: qty 1

## 2020-09-18 MED ORDER — LEVOTHYROXINE SODIUM 50 MCG PO TABS
150.0000 ug | ORAL_TABLET | Freq: Every day | ORAL | Status: DC
  Administered 2020-09-20 – 2020-09-22 (×3): 150 ug via ORAL
  Filled 2020-09-18 (×3): qty 1

## 2020-09-18 MED ORDER — ACETAMINOPHEN 500 MG PO TABS
1000.0000 mg | ORAL_TABLET | Freq: Once | ORAL | Status: DC
  Filled 2020-09-18: qty 2

## 2020-09-18 MED ORDER — SODIUM CHLORIDE 0.9 % IV BOLUS
250.0000 mL | Freq: Once | INTRAVENOUS | Status: AC
  Administered 2020-09-18: 250 mL via INTRAVENOUS

## 2020-09-18 NOTE — ED Notes (Signed)
Amber RN aware of assigned bed 

## 2020-09-18 NOTE — ED Notes (Signed)
mepilex placed on patients bottom

## 2020-09-18 NOTE — ED Notes (Signed)
Patient placed back on bipap at this time due to wearing 6L O2 with sats of 86%

## 2020-09-18 NOTE — ED Notes (Signed)
Patient taken off bi-pap and placed on 4L O2 at this time

## 2020-09-18 NOTE — Consult Note (Signed)
Pharmacy Antibiotic Note  Tammy Shepard is a 70 y.o. female admitted on 09/18/2020 with Respiratory Distress. Pharmacy has been consulted for Vancomycin and aztreonam dosing. Patient has tolerated ceftriaxone on multiple occasions since PCN allergy documentation  Plan: After discussion with Dr. Maisie Fus, antibiotics will be changed to ceftriaxone + doxyxcycline.  Height: 5\' 4"  (162.6 cm) Weight: 90.7 kg (200 lb) IBW/kg (Calculated) : 54.7  Temp (24hrs), Avg:98.4 F (36.9 C), Min:98.4 F (36.9 C), Max:98.4 F (36.9 C)  Recent Labs  Lab 09/18/20 0922  WBC 9.5  CREATININE 0.85    Estimated Creatinine Clearance: 69.1 mL/min (by C-G formula based on SCr of 0.85 mg/dL).    Allergies  Allergen Reactions   Amoxicillin Rash    Tolerating rocephin 10/29/15   Ciprofloxacin Anaphylaxis   Clindamycin Hcl Swelling, Rash and Other (See Comments)    Other Reaction: BAD RASH AND SWELLING, SKIN BR Severe rash and swelling. Skin break down.   Penicillins Anaphylaxis    Tolerating rocephin 11/01/15   Codeine Nausea And Vomiting   Baclofen Itching and Rash   Contrast Media [Iodinated Diagnostic Agents] Itching and Rash   Erythromycin Rash   Iodides Rash    Povidone iodine; betadine   Latex Itching and Rash   Nickel Rash and Other (See Comments)    OTHER REACTION   Soap Itching and Rash   Sulfa Antibiotics Rash   Tape Rash    (silk tape only) skin break down   Tolterodine Rash   Vancomycin Rash    Antimicrobials this admission: 8/11 ceftriaxone >>  8/11 doxycyline >>   Dose adjustments this admission: N/a  Microbiology results: 8/11 BCx: sent 8/11 Sputum: sent  8/11 MRSA PCR: ordered  Thank you for allowing pharmacy to be a part of this patient's care.  10/11, PharmD, BCPS Clinical Pharmacist   09/18/2020 4:22 PM

## 2020-09-18 NOTE — ED Provider Notes (Signed)
[facility] Emergency Department Provider Note  Time seen: 9:21 AM  I have reviewed the triage vital signs and the nursing notes.   HISTORY  Chief Complaint Respiratory Distress   HPI Tammy Shepard is a 70 y.o. female with a past medical history of asthma, CVA with left-sided deficits, diabetes, hypertension, hyperlipidemia prior seizures, presents to the emergency department for dyspnea.  According to EMS report patient is coming from [facility] nursing facility for dyspnea.  Per report patient was found to be foaming at the mouth with white sputum, 81% initial room air saturation.  Patient was placed on CPAP and brought to the emergency department for evaluation.  Here the patient is awake and alert, answering questions appropriately does state shortness of breath but denies other symptoms.  No chest pain.  No known fever.  Afebrile per EMS.  Patient is bedbound due to prior CVA.  Past Medical History:  Diagnosis Date   Asthma    CVA (cerebrovascular accident due to intracerebral hemorrhage) (HCC)    Diabetes mellitus without complication (HCC)    Hyperlipemia    Hypertension    Seizures (HCC)    Stroke [facility])     Patient Active Problem List   Diagnosis Date Noted   Sepsis secondary to UTI (HCC) 08/29/2016   Sepsis (HCC) 02/25/2016   H/O: CVA (cerebrovascular accident) 11/04/2015   Hypotension 11/04/2015   Essential hypertension, benign 11/04/2015   Fever in adult    Generalized weakness    Recurrent UTI 10/31/2015   Fever 10/31/2015   UTI (lower urinary tract infection) 10/31/2015    Past Surgical History:  Procedure Laterality Date   BRAIN SURGERY     CERVICAL FUSION      Prior to Admission medications   Medication Sig Start Date End Date Taking? Authorizing Provider  amLODipine (NORVASC) 10 MG tablet Take 10 mg by mouth daily. 07/06/13   [provider]  ARIPiprazole (ABILIFY) 5 MG tablet Take 5 mg by mouth daily.      [provider]  aspirin 81 MG chewable tablet Chew 81 mg by mouth daily.    [provider]  atorvastatin (LIPITOR) 40 MG tablet Take 40 mg by mouth every morning.    [provider]  clonazePAM (KLONOPIN) 0.5 MG tablet Take 0.5 mg by mouth 2 (two) times daily.    [provider]  divalproex (DEPAKOTE SPRINKLE) 125 MG capsule Take 500 mg by mouth daily.    [provider]  divalproex (DEPAKOTE SPRINKLE) 125 MG capsule Take 750 mg by mouth at bedtime.    [provider]  FLUoxetine (PROZAC) 40 MG capsule Take 40 mg by mouth 2 (two) times daily.     [provider]  levothyroxine (SYNTHROID) 150 MCG tablet Take 150 mcg by mouth daily before breakfast.     [provider]  losartan (COZAAR) 50 MG tablet Take 50 mg by mouth 2 (two) times daily. 10/27/15   [provider]  metFORMIN (GLUCOPHAGE) 500 MG tablet Take 500 mg by mouth 2 (two) times daily. 10/16/15   [provider]  metoprolol tartrate (LOPRESSOR) 25 MG tablet Take 25 mg by mouth 2 (two) times daily.    [provider]  Multiple Vitamin (MULTIVITAMIN WITH MINERALS) TABS tablet Take 1 tablet by mouth daily.    [provider]  nitroGLYCERIN (NITROSTAT) 0.4 MG SL tablet Place 0.4 mg under the tongue every 5 (five) minutes as needed for chest pain.  [provider]  omeprazole (PRILOSEC) 20 MG capsule Take 20 mg by mouth daily. 09/18/15   [provider]  oxyCODONE-acetaminophen (PERCOCET/ROXICET) 5-325 MG tablet Take 1 tablet by mouth 2 (two) times daily.    [provider]  polyethylene glycol (MIRALAX / GLYCOLAX) packet Take 17 g by mouth daily.    [provider]  senna (SENOKOT) 8.6 MG tablet Take 2 tablets by mouth 2 (two) times daily as needed for constipation. 06/05/13   [provider]    Allergies  Allergen Reactions   Amoxicillin Rash    Tolerating rocephin 10/29/15   Ciprofloxacin  Anaphylaxis   Clindamycin Hcl Swelling, Rash and Other (See Comments)    Other Reaction: BAD RASH AND SWELLING, SKIN BR Severe rash and swelling. Skin break down.   Penicillins Anaphylaxis    Tolerating rocephin 11/01/15   Codeine Nausea And Vomiting   Baclofen Itching and Rash   Contrast Media [Iodinated Diagnostic Agents] Itching and Rash   Erythromycin Rash   Iodides Rash    Povidone iodine; betadine   Latex Itching and Rash   Nickel Rash and Other (See Comments)    OTHER REACTION   Soap Itching and Rash   Sulfa Antibiotics Rash   Tape Rash    (silk tape only) skin break down   Tolterodine Rash   Vancomycin Rash    No family history on file.  Social History Social History   Tobacco Use   Smoking status: Former   Smokeless tobacco: Never  Substance Use Topics   Alcohol use: No   Drug use: No    Review of Systems Constitutional: Negative for fever. Cardiovascular: Negative for chest pain. Respiratory: Positive for shortness of breath. Gastrointestinal: Negative for abdominal pain, vomiting Musculoskeletal: Negative for musculoskeletal complaints Neurological: Negative for headache All other ROS negative  ____________________________________________   PHYSICAL EXAM:  VITAL SIGNS: ED Triage Vitals [09/18/20 0919]  Enc Vitals Group     BP 128/87     Pulse Rate (!) 109     Resp (!) 23     Temp 98.4 F (36.9 C)     Temp Source Oral     SpO2 (!) 76 %     Weight      Height      Head Circumference      Peak Flow      Pain Score      Pain Loc      Pain Edu?      Excl. in GC?    Constitutional: Awake alert, no acute distress but appears quite fatigued, CPAP in place currently.  Answering questions appropriately. Eyes: Normal exam ENT      Head: Normocephalic and atraumatic.      Mouth/Throat: Mucous membranes are moist. Cardiovascular: Normal rate, regular rhythm around 100 bpm. Respiratory: Normal respiratory effort without tachypnea nor retractions.  Breath sounds are clear  Gastrointestinal: Soft and nontender. No distention.   Musculoskeletal: Nontender with normal range of motion in all extremities.  Neurologic:  Normal speech and language. No gross focal neurologic deficits  Skin:  Skin is warm, dry and intact.  Psychiatric: Mood and affect are normal.   ____________________________________________    EKG  EKG viewed and interpreted by myself shows sinus tachycardia 108 bpm with a narrow QRS, normal axis, normal intervals, nonspecific ST changes.  ____________________________________________    RADIOLOGY  No acute findings on chest x-ray  ____________________________________________   INITIAL IMPRESSION / ASSESSMENT AND PLAN / ED COURSE  Pertinent labs & imaging results that were available during my care of the patient were reviewed by me and considered in my medical decision making (see chart for details).   Patient presents to the emergency department for shortness of breath from her nursing facility.  We will transition the patient from CPAP to BiPAP, given the patient shortness of breath with EMS description of what sounds like pulmonary edema highly suspect CHF/pulmonary edema.  Differential would also include pneumonia, aspiration, COVID or PE.  We will check labs, chest x-ray and continue to closely monitor.  Lab work is largely nonrevealing.  COVID-negative.  Troponin negative.  Chest x-ray overall clear.  Daughter is now here who is concerned that the patient may have aspirated and she was eating a large burger yesterday.  Patient's D-dimer is slightly elevated.  Unable to obtain CTA due to contrast allergy.  We will obtain a CT without contrast to evaluate for possible aspiration.  We will order a VQ scan as well.  Patient admitted to the hospital service for further treatment and evaluation.  Attempted to wean off BiPAP, patient only tolerated this for approximately 30 minutes before desatted, placed back on  BiPAP.  AMBRIE CARTE was evaluated in Emergency Department on 09/18/2020 for the symptoms described in the history of present illness. She was evaluated in the context of the global COVID-19 pandemic, which necessitated consideration that the patient might be at risk for infection with the SARS-CoV-2 virus that causes COVID-19. Institutional protocols and algorithms that pertain to the evaluation of patients at risk for COVID-19 are in a state of rapid change based on information released by regulatory bodies including the CDC and federal and state organizations. These policies and algorithms were followed during the patient's care in the ED.  ____________________________________________   FINAL CLINICAL IMPRESSION(S) / ED DIAGNOSES  Dyspnea Hypoxia   Minna Antis, MD 09/18/20 1301

## 2020-09-18 NOTE — ED Notes (Signed)
Patient back from CT. Patient turned to left side with pillow

## 2020-09-18 NOTE — ED Notes (Signed)
X-ray at bedside

## 2020-09-18 NOTE — H&P (Signed)
History and Physical    Tammy Shepard HMC:[id number] DOB: [birth year] DOA: 09/18/2020  PCP: Lauro Regulus, MD  Patient coming from: home  I have personally briefly reviewed patient's old medical records in [facility]  Chief Complaint: sob  HPI: Tammy Shepard is a 70 y.o. female with medical history significant of asthma, CVA  residual deficits leading to bed bound state, DMII, HLD, HTN , SZ  who is BIB EMS from [facility] with complaint of" sob and foaming a the mouth"  Patient was noted to be mid 70's on RA and was placed on bipap and transported to ED. In ED patient was transitioned to bipap and during transition patient also dropped to the mid 70's but recovered to 90's once on bipap. Per patient this was an acute event notes this am she felt her normal self. She denies any cough fever, chills,chest pain  n/v/d/dysuria. She notes she feel much improve on bipap. On evaluation patient noted to have CT suggestive of pna.  ED Course: Vitals: afeb, bp 128/87, hr 109, rr 23 sat 76% ( bipap 96% EKG: sinus tachycardia with diffuse st depression Wbc: 9.5, hgb 14.7, plt 177 NA137, K 4.1, glu 128,  CE:7,15 D-dimer:58 Cxr :NAD Covid: negative CT: Mild right posterior basilar opacity is noted concerning for atelectasis or pneumonia.   Small left pleural effusion is noted with adjacent subsegmental atelectasis.   4 mm nodule is noted in left lower lobe. No follow-up needed if patient is low-risk. Non-contrast chest CT can be considered in 12 months if patient is high-risk. This recommendation follows the consensus statement: Guidelines for Management of Incidental Pulmonary Nodules Detected on CT Images: From the Fleischner Society 2017; Radiology 2017; 284:228-243.   Coronary artery calcifications are noted suggesting coronary artery disease.   Aortic Atherosclerosis (ICD10-I70.0).  Lower extremity dopplers pending   Tx lasix 60, tylenol 1000   Review of  Systems: As per HPI otherwise 10 point review of systems negative.   Past Medical History:  Diagnosis Date   Asthma    CVA (cerebrovascular accident due to intracerebral hemorrhage) (HCC)    Diabetes mellitus without complication (HCC)    Hyperlipemia    Hypertension    Seizures (HCC)    Stroke [facility])     Past Surgical History:  Procedure Laterality Date   BRAIN SURGERY     CERVICAL FUSION       reports that she has quit smoking. She has never used smokeless tobacco. She reports that she does not drink alcohol and does not use drugs.  Allergies  Allergen Reactions   Amoxicillin Rash    Tolerating rocephin 10/29/15   Ciprofloxacin Anaphylaxis   Clindamycin Hcl Swelling, Rash and Other (See Comments)    Other Reaction: BAD RASH AND SWELLING, SKIN BR Severe rash and swelling. Skin break down.   Penicillins Anaphylaxis    Tolerating rocephin 11/01/15   Codeine Nausea And Vomiting   Baclofen Itching and Rash   Contrast Media [Iodinated Diagnostic Agents] Itching and Rash   Erythromycin Rash   Iodides Rash    Povidone iodine; betadine   Latex Itching and Rash   Nickel Rash and Other (See Comments)    OTHER REACTION   Soap Itching and Rash   Sulfa Antibiotics Rash   Tape Rash    (silk tape only) skin break down   Tolterodine Rash   Vancomycin Rash    History reviewed. No pertinent family history.  Prior to Admission medications  Medication Sig Start Date End Date Taking? Authorizing Provider  amLODipine (NORVASC) 10 MG tablet Take 10 mg by mouth daily. 07/06/13   [provider]  ARIPiprazole (ABILIFY) 5 MG tablet Take 5 mg by mouth daily.     [provider]  aspirin 81 MG chewable tablet Chew 81 mg by mouth daily.    [provider]  atorvastatin (LIPITOR) 40 MG tablet Take 40 mg by mouth every morning.    [provider]  clonazePAM (KLONOPIN) 0.5 MG tablet Take 0.5 mg by mouth 2 (two) times daily.    [provider]   divalproex (DEPAKOTE SPRINKLE) 125 MG capsule Take 500 mg by mouth daily.    [provider]  divalproex (DEPAKOTE SPRINKLE) 125 MG capsule Take 750 mg by mouth at bedtime.    [provider]  FLUoxetine (PROZAC) 40 MG capsule Take 40 mg by mouth 2 (two) times daily.     [provider]  levothyroxine (SYNTHROID) 150 MCG tablet Take 150 mcg by mouth daily before breakfast.     [provider]  losartan (COZAAR) 50 MG tablet Take 50 mg by mouth 2 (two) times daily. 10/27/15   [provider]  metFORMIN (GLUCOPHAGE) 500 MG tablet Take 500 mg by mouth 2 (two) times daily. 10/16/15   [provider]  metoprolol tartrate (LOPRESSOR) 25 MG tablet Take 25 mg by mouth 2 (two) times daily.    [provider]  Multiple Vitamin (MULTIVITAMIN WITH MINERALS) TABS tablet Take 1 tablet by mouth daily.    [provider]  nitroGLYCERIN (NITROSTAT) 0.4 MG SL tablet Place 0.4 mg under the tongue every 5 (five) minutes as needed for chest pain.    [provider]  omeprazole (PRILOSEC) 20 MG capsule Take 20 mg by mouth daily. 09/18/15   [provider]  oxyCODONE-acetaminophen (PERCOCET/ROXICET) 5-325 MG tablet Take 1 tablet by mouth 2 (two) times daily.    [provider]  polyethylene glycol (MIRALAX / GLYCOLAX) packet Take 17 g by mouth daily.    [provider]  senna (SENOKOT) 8.6 MG tablet Take 2 tablets by mouth 2 (two) times daily as needed for constipation. 06/05/13   [provider]    Physical Exam: Vitals:   09/18/20 1100 09/18/20 1330 09/18/20 1400 09/18/20 1430  BP: 118/67 107/79 105/66 99/62  Pulse: 97 (!) 110 100 (!) 101  Resp: 12 (!) 21 (!) 21 11  Temp:      TempSrc:      SpO2: 96% 97% 98% 96%  Weight:      Height:         Vitals:   09/18/20 1100 09/18/20 1330 09/18/20 1400 09/18/20 1430  BP: 118/67 107/79 105/66 99/62  Pulse: 97 (!) 110 100 (!) 101  Resp: 12 (!) 21 (!) 21  11  Temp:      TempSrc:      SpO2: 96% 97% 98% 96%  Weight:      Height:      Constitutional: NAD, calm, comfortable on bipap  Eyes: PERRL, lids and conjunctivae normal ENMT: deferred while on bipap Neck: normal, supple, no masses, no thyromegaly Respiratory: clear to auscultation bilaterally, no wheezing, no crackles. Normal respiratory effort. currenlty No accessory muscle use.  Cardiovascular: Regular rate and rhythm, no murmurs / rubs / gallops. + dependent extremity edema. 2+ pedal pulses. No carotid bruits.  Abdomen: no tenderness, no masses palpated. No hepatosplenomegaly. Bowel sounds positive.  Musculoskeletal: no clubbing /  cyanosis. No joint deformity upper and lower extremities. Good ROM, no contractures. Normal muscle tone.  Skin: no rashes, lesions, ulcers. No induration Neurologic: CN 2-12 grossly intact. Sensation intact, DTR normal. Strength 5/5 in all 4.  Psychiatric: Normal judgment and insight. Alert and oriented x 3. Normal mood.    Labs on Admission: I have personally reviewed following labs and imaging studies  CBC: Recent Labs  Lab 09/18/20 0922  WBC 9.5  HGB 14.7  HCT 42.3  MCV 99.8  PLT 177   Basic Metabolic Panel: Recent Labs  Lab 09/18/20 0922  NA 137  K 4.1  CL 104  CO2 26  GLUCOSE 129*  BUN 22  CREATININE 0.85  CALCIUM 8.8*   GFR: Estimated Creatinine Clearance: 69.1 mL/min (by C-G formula based on SCr of 0.85 mg/dL). Liver Function Tests: Recent Labs  Lab 09/18/20 0922  AST 17  ALT 12  ALKPHOS 56  BILITOT 0.6  PROT 7.1  ALBUMIN 3.5   No results for input(s): LIPASE, AMYLASE in the last 168 hours. No results for input(s): AMMONIA in the last 168 hours. Coagulation Profile: No results for input(s): INR, PROTIME in the last 168 hours. Cardiac Enzymes: No results for input(s): CKTOTAL, CKMB, CKMBINDEX, TROPONINI in the last 168 hours. BNP (last 3 results) No results for input(s): PROBNP in the last 8760 hours. HbA1C: No  results for input(s): HGBA1C in the last 72 hours. CBG: No results for input(s): GLUCAP in the last 168 hours. Lipid Profile: No results for input(s): CHOL, HDL, LDLCALC, TRIG, CHOLHDL, LDLDIRECT in the last 72 hours. Thyroid Function Tests: No results for input(s): TSH, T4TOTAL, FREET4, T3FREE, THYROIDAB in the last 72 hours. Anemia Panel: No results for input(s): VITAMINB12, FOLATE, FERRITIN, TIBC, IRON, RETICCTPCT in the last 72 hours. Urine analysis:    Component Value Date/Time   COLORURINE YELLOW (A) 08/06/2019 1538   APPEARANCEUR HAZY (A) 08/06/2019 1538   APPEARANCEUR Cloudy 08/18/2011 1018   LABSPEC 1.012 08/06/2019 1538   LABSPEC 1.017 08/18/2011 1018   PHURINE 6.0 08/06/2019 1538   GLUCOSEU NEGATIVE 08/06/2019 1538   GLUCOSEU Negative 08/18/2011 1018   HGBUR SMALL (A) 08/06/2019 1538   BILIRUBINUR NEGATIVE 08/06/2019 1538   BILIRUBINUR Negative 08/18/2011 1018   KETONESUR 5 (A) 08/06/2019 1538   PROTEINUR NEGATIVE 08/06/2019 1538   NITRITE POSITIVE (A) 08/06/2019 1538   LEUKOCYTESUR LARGE (A) 08/06/2019 1538   LEUKOCYTESUR 3+ 08/18/2011 1018    Radiological Exams on Admission: DG Chest Portable 1 View  Result Date: 09/18/2020 CLINICAL DATA:  Shortness of EXAM: PORTABLE CHEST 1 VIEW COMPARISON:  Radiograph 08/06/2019 FINDINGS: The patient is rotated towards the right. Unchanged cardiomediastinal silhouette. There is no new focal airspace disease. There is no large pleural effusion or visible pneumothorax. Partially visualized cervical spine fusion hardware. There is no acute osseous abnormality. Bilateral shoulder degenerative changes. IMPRESSION: No evidence of acute cardiopulmonary disease. Electronically Signed   By: Caprice RenshawJacob  Kahn M.D.   On: 09/18/2020 10:23    EKG: Independently reviewed.  Assessment/Plan HCAP with acute hypoxic respiratory failure -admit to progressive care  -wean bipap as able  -broad spectrum abx per hospital protocol  -f/u on cultures and  urine ag  -nebs/pulmonary toilet -of note covid/flu negative  -abg ordered   Hx of asthma  -no wheezing no exam note on controller regimen    CVA   -residual deficits leading to bed bound state -continue secondary ppx  DMII -hold oral hypogylcemic -place on iss    HLD -  continue statin     HTN  -soft  -resume bp meds in am as able    SZ  d/o -continue valproic acid  -check level  Hypothyrodism -resume synthroid  Psych/do/vascular dementia -resume home regimen  DVT prophylaxis: lwmh Code Status: full Family Communication: none at bedside Disposition Plan: patient  expected to be admitted greater than 2 midnights  Consults called:n/a Admission status: progressive care   Lurline Del MD [facility]   If 7PM-7AM, please contact night-coverage [email or web address] Password [facility]  09/18/2020, 2:56 PM

## 2020-09-18 NOTE — ED Triage Notes (Signed)
Arrived by EMS from [facility]. Staff reports finding patient foaming at the mouth. Diminished breath sounds per EMS.

## 2020-09-19 ENCOUNTER — Inpatient Hospital Stay
Admit: 2020-09-19 | Discharge: 2020-09-19 | Disposition: A | Discharge status: Home / Self Care | Payer: Medicare (Managed Care) | Attending: Internal Medicine | Admitting: Internal Medicine

## 2020-09-19 ENCOUNTER — Encounter: Payer: Self-pay | Admitting: Internal Medicine

## 2020-09-19 LAB — URINALYSIS, ROUTINE W REFLEX MICROSCOPIC
Bilirubin Urine: NEGATIVE
Glucose, UA: NEGATIVE mg/dL
Hgb urine dipstick: NEGATIVE
Ketones, ur: 20 mg/dL — AB
Leukocytes,Ua: NEGATIVE
Nitrite: NEGATIVE
Protein, ur: NEGATIVE mg/dL
Specific Gravity, Urine: 1.012 (ref 1.005–1.030)
pH: 8 (ref 5.0–8.0)

## 2020-09-19 LAB — COMPREHENSIVE METABOLIC PANEL
ALT: 8 U/L (ref 0–44)
AST: 8 U/L — ABNORMAL LOW (ref 15–41)
Albumin: 3 g/dL — ABNORMAL LOW (ref 3.5–5.0)
Alkaline Phosphatase: 49 U/L (ref 38–126)
Anion gap: 6 (ref 5–15)
BUN: 18 mg/dL (ref 8–23)
CO2: 25 mmol/L (ref 22–32)
Calcium: 7.7 mg/dL — ABNORMAL LOW (ref 8.9–10.3)
Chloride: 112 mmol/L — ABNORMAL HIGH (ref 98–111)
Creatinine, Ser: 0.82 mg/dL (ref 0.44–1.00)
GFR, Estimated: >60 mL/min (ref >60)
Glucose, Bld: 88 mg/dL (ref 70–99)
Potassium: 3 mmol/L — ABNORMAL LOW (ref 3.5–5.1)
Sodium: 143 mmol/L (ref 135–145)
Total Bilirubin: 1 mg/dL (ref 0.3–1.2)
Total Protein: 5.8 g/dL — ABNORMAL LOW (ref 6.5–8.1)

## 2020-09-19 LAB — CBC WITH DIFFERENTIAL/PLATELET
Abs Immature Granulocytes: 0.09 10*3/uL — ABNORMAL HIGH (ref 0.00–0.07)
Basophils Absolute: 0 10*3/uL (ref 0.0–0.1)
Basophils Relative: 0 %
Eosinophils Absolute: 0 10*3/uL (ref 0.0–0.5)
Eosinophils Relative: 0 %
HCT: 40.2 % (ref 36.0–46.0)
Hemoglobin: 13.7 g/dL (ref 12.0–15.0)
Immature Granulocytes: 1 %
Lymphocytes Relative: 20 %
Lymphs Abs: 2.4 10*3/uL (ref 0.7–4.0)
MCH: 34.1 pg — ABNORMAL HIGH (ref 26.0–34.0)
MCHC: 34.1 g/dL (ref 30.0–36.0)
MCV: 100 fL (ref 80.0–100.0)
Monocytes Absolute: 1.4 10*3/uL — ABNORMAL HIGH (ref 0.1–1.0)
Monocytes Relative: 12 %
Neutro Abs: 7.8 10*3/uL — ABNORMAL HIGH (ref 1.7–7.7)
Neutrophils Relative %: 67 %
Platelets: 168 10*3/uL (ref 150–400)
RBC: 4.02 MIL/uL (ref 3.87–5.11)
RDW: 14.8 % (ref 11.5–15.5)
WBC: 11.7 10*3/uL — ABNORMAL HIGH (ref 4.0–10.5)
nRBC: 0 % (ref 0.0–0.2)

## 2020-09-19 LAB — BLOOD CULTURE ID PANEL (REFLEXED) - BCID2

## 2020-09-19 LAB — ECHOCARDIOGRAM COMPLETE
AR max vel: 1.43 cm2
AV Area VTI: 1.49 cm2
AV Area mean vel: 1.3 cm2
AV Mean grad: 5 mmHg
AV Peak grad: 9.1 mmHg
Ao pk vel: 1.51 m/s
Area-P 1/2: 9.73 cm2
Height: 64 in
MV VTI: 2.39 cm2
S' Lateral: 3.45 cm
Weight: 3200 oz

## 2020-09-19 LAB — STREP PNEUMONIAE URINARY ANTIGEN: Strep Pneumo Urinary Antigen: NEGATIVE

## 2020-09-19 LAB — PROCALCITONIN
Procalcitonin: <0.1 ng/mL
Procalcitonin: <0.1 ng/mL

## 2020-09-19 LAB — CBG MONITORING, ED: Glucose-Capillary: 89 mg/dL (ref 70–99)

## 2020-09-19 LAB — LACTIC ACID, PLASMA
Lactic Acid, Venous: 1 mmol/L (ref 0.5–1.9)
Lactic Acid, Venous: 0.7 mmol/L (ref 0.5–1.9)

## 2020-09-19 LAB — HIV ANTIBODY (ROUTINE TESTING W REFLEX): HIV Screen 4th Generation wRfx: NONREACTIVE

## 2020-09-19 LAB — GLUCOSE, CAPILLARY
Glucose-Capillary: 120 mg/dL — ABNORMAL HIGH (ref 70–99)
Glucose-Capillary: 112 mg/dL — ABNORMAL HIGH (ref 70–99)
Glucose-Capillary: 117 mg/dL — ABNORMAL HIGH (ref 70–99)

## 2020-09-19 LAB — MRSA NEXT GEN BY PCR, NASAL: MRSA by PCR Next Gen: NOT DETECTED

## 2020-09-19 MED ORDER — ENOXAPARIN SODIUM 60 MG/0.6ML IJ SOSY
0.5000 mg/kg | PREFILLED_SYRINGE | INTRAMUSCULAR | Status: DC
  Administered 2020-09-19: 45 mg via SUBCUTANEOUS
  Filled 2020-09-19 (×2): qty 0.45

## 2020-09-19 MED ORDER — METOPROLOL TARTRATE 25 MG PO TABS
25.0000 mg | ORAL_TABLET | Freq: Two times a day (BID) | ORAL | Status: DC
  Administered 2020-09-19 – 2020-09-22 (×6): 25 mg via ORAL
  Filled 2020-09-19 (×6): qty 1

## 2020-09-19 MED ORDER — PERFLUTREN LIPID MICROSPHERE
1.0000 mL | INTRAVENOUS | Status: AC | PRN
  Administered 2020-09-19: 2 mL via INTRAVENOUS
  Filled 2020-09-19: qty 10

## 2020-09-19 MED ORDER — ACETAMINOPHEN 325 MG PO TABS
650.0000 mg | ORAL_TABLET | ORAL | Status: DC | PRN
  Administered 2020-09-19 – 2020-09-20 (×3): 650 mg via ORAL
  Filled 2020-09-19 (×3): qty 2

## 2020-09-19 NOTE — ED Notes (Signed)
Patient requesting Tylenol for pain.  MD made aware.  Swallow screen preformed prior to administration.  Patient given small sip of water.  Cough noted after swallowing.  Patient informed of need to remain NPO at this time.  Understanding voiced

## 2020-09-19 NOTE — TOC Initial Note (Addendum)
Transition of Care [facility]) - Initial/Assessment Note    Patient Details  Name: Tammy Shepard MRN: [id number] Date of Birth: [birth year]  [facility] of [facility]) CM/SW Contact:    Gildardo Griffes, LCSW Phone Number: 09/19/2020, 10:00 AM  Clinical Narrative:                   Update: Lucille Passy, legal guardian called back from [phone], provided updated and does reports plan to dc back to [facility] when medically stable.  CSW notes patient from [facility], legal guardian is Lucille Passy with APS. CSW attempted to call Lucille Passy but vm is full. CSW was able to lvm for Freda's supervisor Sharlyne Cai at [phone] to confirm plan is for patient to return to [facility] at [facility]. Pending call back.    Expected Discharge Plan: Skilled Nursing Facility Barriers to Discharge: Continued Medical Work up   Patient Goals and CMS Choice Patient states their goals for this hospitalization and ongoing recovery are:: snf CMS Medicare.gov Compare Post Acute Care list provided to:: Legal Guardian Choice offered to / list presented to : [facility] POA / Guardian  Expected Discharge Plan and Services Expected Discharge Plan: Skilled Nursing Facility     Post Acute Care Choice: Skilled Nursing Facility Living arrangements for the past 2 months: Skilled Nursing Facility                                      Prior Living Arrangements/Services Living arrangements for the past 2 months: Skilled Nursing Facility Lives with:: Facility Resident                   Activities of Daily Living Home Assistive Devices/Equipment: Dentures (specify type), Wheelchair ADL Screening (condition at time of admission) Patient's cognitive ability adequate to safely complete daily activities?: Yes Is the patient deaf or have difficulty hearing?: No Does the patient have difficulty seeing, even when wearing glasses/contacts?: No Does the patient have difficulty concentrating, remembering, or making  decisions?: No Patient able to express need for assistance with ADLs?: Yes Does the patient have difficulty dressing or bathing?: Yes Independently performs ADLs?: No Communication: Independent Dressing (OT): Dependent Is this a change from baseline?: Pre-admission baseline Grooming: Dependent Is this a change from baseline?: Pre-admission baseline Feeding: Needs assistance Is this a change from baseline?: Pre-admission baseline Bathing: Dependent Is this a change from baseline?: Pre-admission baseline Toileting: Dependent Is this a change from baseline?: Pre-admission baseline In/Out Bed: Dependent Is this a change from baseline?: Pre-admission baseline Walks in Home: Dependent Is this a change from baseline?: Pre-admission baseline Does the patient have difficulty walking or climbing stairs?: Yes Weakness of Legs: Both Weakness of Arms/Hands: Both  Permission Sought/Granted                  Emotional Assessment         Alcohol / Substance Use: Not Applicable Psych Involvement: No (comment)  Admission diagnosis:  SOB (shortness of breath) [R06.02] CAP (community acquired pneumonia) [J18.9] Patient Active Problem List   Diagnosis Date Noted   Pressure injury of skin 09/18/2020   CAP (community acquired pneumonia) 09/18/2020   Sepsis secondary to UTI (HCC) 08/29/2016   Sepsis (HCC) 02/25/2016   H/O: CVA (cerebrovascular accident) 11/04/2015   Hypotension 11/04/2015   Essential hypertension, benign 11/04/2015   Fever in adult    Generalized weakness    Recurrent UTI 10/31/2015  Fever 10/31/2015   UTI (lower urinary tract infection) 10/31/2015   PCP:  Lauro Regulus, MD Pharmacy:   [facility] - [address], [place] - [address] [place] [address] Phone: [phone] Fax: [phone]     Social Determinants of Health (SDOH) Interventions    Readmission Risk Interventions No flowsheet data found.

## 2020-09-19 NOTE — ED Notes (Signed)
RT at bedside.

## 2020-09-19 NOTE — Evaluation (Signed)
Clinical/Bedside Swallow Evaluation Patient Details  Name: Tammy Shepard MRN: [id number] Date of Birth: Mar 09, 1950  Today's Date: 09/19/2020 Time: SLP Start Time (ACUTE ONLY): 1055 SLP Stop Time (ACUTE ONLY): 1150 SLP Time Calculation (min) (ACUTE ONLY): 55 min  Past Medical History:  Past Medical History:  Diagnosis Date   Asthma    CVA (cerebrovascular accident due to intracerebral hemorrhage) (HCC)    Diabetes mellitus without complication (HCC)    Hyperlipemia    Hypertension    Seizures (HCC)    Stroke [facility])    Past Surgical History:  Past Surgical History:  Procedure Laterality Date   BRAIN SURGERY     CERVICAL FUSION     HPI:  Pt is a 70 y.o. female with medical history significant of asthma, CVA w/ residual deficits leading to bed bound state, DMII, HLD, HTN, Obesity, SZ  who is BIB EMS from [facility] with complaint of" sob and foaming a the mouth"  Patient was noted to be mid 70's on RA and was placed on bipap and transported to ED. In ED patient was transitioned to bipap and during transition patient also dropped to the mid 70's but recovered to 90's once on bipap. Per patient this was an acute event notes this am she felt her normal self. She denies any cough fever, chills,chest pain  n/v/d/dysuria. She notes she feel much improve on bipap.  CXR: Linear areas of atelectasis in the lung bases.   Assessment / Plan / Recommendation Clinical Impression  Pt appears to present w/ grossly adequate oropharyngeal phase swallow function w/ No oropharyngeal phase dysphagia noted, No neuromuscular deficits noted. Pt is Edentulous and required extra Time for mashing/gumming of increased textured trials. She consumed po trials w/ No overt, clinical s/s of aspiration during po all intake. Pt appears at reduced risk for aspiration following general aspiration precautions. She stated her Daughter was bringing her Dentures today. Recommended more soft, broken down foods until Dentures  can be present/worn.     During po trials, pt consumed all consistencies w/ no overt coughing, decline in vocal quality, or change in respiratory presentation during/post trials. O2 sats remained 97% Oral phase appeared [facility] w/ timely bolus management and control of bolus propulsion for A-P transfer for swallowing. Oral clearing achieved w/ all trial consistencies. Increased Time for gumming of softened solids required, but adequate. OM Exam appeared grossly [facility] w/ no unilateral weakness noted -- oral tremors++. Speech Clear. Oral care required d/t sticky oral cavity. Pt fed self holding her own Cup when drinking but required feeding support w/ foods d/t tremerous UEs -- pt stated this was Baseline for her.    Recommend a Mech Soft diet consistency for the well-Cut meats, moistened foods; Thin liquids -- pt uses a Straw at home. Recommend general aspiration precautions, Pills WHOLE in Puree for safer, easier swallowing -- this is Baseline for pt at home. Education given on Pills in Puree; food consistencies and easy to eat options; general aspiration precautions to pt/NSG. NSG to reconsult if any new needs arise. NSG agreed. SLP Visit Diagnosis: Dysphagia, unspecified (R13.10)    Aspiration Risk  Mild aspiration risk;Risk for inadequate nutrition/hydration (reduced following general aspiration precautions)    Diet Recommendation   Mech Soft diet consistency for the well-Cut meats, moistened foods; Thin liquids -- pt uses a Straw at home. Recommend general aspiration precautions; Feeding Assistance at meals -- must take Smaller sips of liquids more Slowly, AND HOLD CUP HERSELF WHEN DRINKING  Medication  Administration: Whole meds with puree (baseline)    Other  Recommendations Recommended Consults:  (Dietician f/u) Oral Care Recommendations: Oral care BID;Oral care before and after PO;Staff/trained caregiver to provide oral care (Denture care) Other Recommendations:  (n/a)   Follow up Recommendations  None      Frequency and Duration  (n/a)   (n/a)       Prognosis Prognosis for Safe Diet Advancement: Fair (-Good) Barriers to Reach Goals: Time post onset;Severity of deficits      Swallow Study   General Date of Onset: 09/18/20 HPI: Pt is a 70 y.o. female with medical history significant of asthma, CVA w/ residual deficits leading to bed bound state, DMII, HLD, HTN, Obesity, SZ  who is BIB EMS from [facility] with complaint of" sob and foaming a the mouth"  Patient was noted to be mid 70's on RA and was placed on bipap and transported to ED. In ED patient was transitioned to bipap and during transition patient also dropped to the mid 70's but recovered to 90's once on bipap. Per patient this was an acute event notes this am she felt her normal self. She denies any cough fever, chills,chest pain  n/v/d/dysuria. She notes she feel much improve on bipap.  CXR: Linear areas of atelectasis in the lung bases. Type of Study: Bedside Swallow Evaluation Previous Swallow Assessment: 2018 Diet Prior to this Study: Dysphagia 3 (soft);Thin liquids (reported by pt currently w/ straw use) Temperature Spikes Noted: No (wbc 11.7) Respiratory Status: Nasal cannula (2-4L) History of Recent Intubation: No Behavior/Cognition: Alert;Cooperative;Pleasant mood;Requires cueing (min) Oral Cavity Assessment: Dry (sticky) Oral Care Completed by SLP: Yes Oral Cavity - Dentition: Edentulous (Daughter is brining her Dentures) Vision: Functional for self-feeding (to Hold Cup to drink) Self-Feeding Abilities: Able to feed self;Needs assist;Needs set up;Total assist (shaky UEs when feeding self; can hold cup to drink) Patient Positioning: Upright in bed (needed full positioning support) Baseline Vocal Quality: Normal Volitional Cough:  (Fair and effective) Volitional Swallow: Able to elicit    Oral/Motor/Sensory Function Overall Oral Motor/Sensory Function: Within functional limits   Ice Chips Ice chips:  Within functional limits Presentation: Spoon (fed; 3 trials)   Thin Liquid Thin Liquid: Within functional limits Presentation: Self Fed;Straw (supported/cued; 10+ sips/trials) Other Comments: encouraged to drink slowly    Nectar Thick Nectar Thick Liquid: Not tested   Honey Thick Honey Thick Liquid: Not tested   Puree Puree: Within functional limits Presentation: Spoon (fed; ~3 ozs)   Solid     Solid: Impaired (Edentulous) Presentation: Spoon (fed; 10 trials) Oral Phase Impairments: Impaired mastication (edentulous) Pharyngeal Phase Impairments:  (none) Other Comments: potatoes and banana          Jerilynn Som, MS, [employer] Speech Language Pathologist Rehab Services [phone] [facility] 09/19/2020,12:06 PM

## 2020-09-19 NOTE — ED Notes (Signed)
Patient currently off BIPAP. Tolerating oxygen by [place]

## 2020-09-19 NOTE — Progress Notes (Signed)
ANTICOAGULATION CONSULT NOTE   Pharmacy has been consulted for: Enoxaparin (Lovenox) for DVT Prophylaxis   Filed Weights   09/18/20 0922  Weight: 90.7 kg (200 lb)    Body mass index is 34.33 kg/m.  Estimated Creatinine Clearance: 71.6 mL/min (by C-G formula based on SCr of 0.82 mg/dL).   Based on [facility] policy patient is candidate for enoxaparin 0.5mg /kg TBW SQ every 24 hours based on BMI being >30.   Plan: start enoxaparin 45 mg every 24 hours    Raiford Noble, PharmD Clinical Pharmacist  09/19/2020 4:10 PM

## 2020-09-19 NOTE — Progress Notes (Signed)
PROGRESS NOTE  Tammy Shepard  DOB: 02-04-1951  PCP: Kirk Ruths, MD LNL:[id number]  DOA: 09/18/2020  LOS: 1 day  Hospital Day: 2   Chief Complaint  Patient presents with   Respiratory Distress    Brief narrative: Tammy Shepard is a 70 y.o. 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 Patient was brought to the ED from [facility] with complaint of "sob and foaming from mouth".  She was noted to have low oxygen saturations in the 70s on room air and hence brought to the ED on high flow oxygen.  In the ED, patient was transitioned to BiPAP While in the ED, patient had a fever of 101.7, she was tachycardic to 100s Labs with WBC count 9.5, lactic acid level and procalcitonin level normal Respiratory virus panel negative. CT chest with mild right posterior basilar opacity concerning for atelectasis or pneumonia, small left pleural effusion with adjacent subsegmental atelectasis.  4 mm nodule in the left lower lobe  Subjective: Patient was seen and examined this morning.  Elderly Caucasian female.  Propped up in bed.  Not in distress.  No new symptoms.  She is getting an echocardiogram done. Chart reviewed T-max one 1.7 yesterday evening.  Overnight low-grade temperature, blood pressure stable. On 2 L oxygen this morning.  Assessment/Plan: Sepsis_[facility] associated pneumonia -Presented with shortness of breath -Met sepsis criteria with fever, tachycardia, pneumonia -Blood culture sent.  Currently on broad-spectrum antibiotic coverage with IV Rocephin and IV azithromycin -Continue maintenance IV fluid. Recent Labs  Lab 09/18/20 0922 09/18/20 1826 09/18/20 2302 09/19/20 0604  WBC 9.5  --   --  11.7*  LATICACIDVEN  --  1.2 1.0 0.7  PROCALCITON  --   --  <0.10 <0.10    Acute respiratory with hypoxia -Initially required BiPAP.  Currently on 2 L oxygen by nasal  cannula.  Type 2 diabetes mellitus -A1c 5.5 in 8/11 -Home meds include metformin 500 mg daily -Currently on sliding scale with Accu-Cheks Recent Labs  Lab 09/18/20 2233 09/19/20 0736 09/19/20 1226  GLUCAP 112* 89 120*   Essential hypertension -Home meds include amlodipine 10 mg daily, losartan 50 mg twice daily, metoprolol 25 mg twice daily -Currently all medicines seem to be on hold. I would resume metoprolol and continue to hold others. Continue to monitor blood pressure. -Echocardiogram done.  Report pending.  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     Diet:  Diet Order             DIET DYS 3 Room service appropriate? Yes with Assist; Fluid consistency: Thin  Diet effective now                  DVT prophylaxis: Start on Lovenox subcu   Antimicrobials: Ceftriaxone, doxycycline Fluid: Normal saline 100 mill per hour.  Reduce to 50 mill per hour. Consultants: None Family Communication: None at bedside  Status is: Inpatient  Remains inpatient appropriate because: Needs IV antibiotics  Dispo: The patient is from: [facility]              Anticipated d/c is to: Back to  [place] in 2 to 3 days              Patient currently is not medically stable to d/c.   Difficult to place patient No     Infusions:   sodium chloride 100 mL/hr at 09/19/20 1543   cefTRIAXone (ROCEPHIN)  IV Stopped (09/18/20 1840)   doxycycline (VIBRAMYCIN) IV Stopped (09/19/20 0960)   sodium chloride Stopped (09/18/20 1847)    Scheduled Meds:  acetaminophen  1,000 mg Oral Once   aspirin  81 mg Oral Daily    divalproex  500 mg Oral BID   insulin aspart  0-6 Units Subcutaneous TID WC   levothyroxine  150 mcg Oral QAC breakfast   metoprolol tartrate  25 mg Oral BID   multivitamin with minerals  1 tablet Oral Daily   pantoprazole (PROTONIX) IV  40 mg Intravenous Daily    Antimicrobials: Anti-infectives (From admission, onward)    Start     Dose/Rate Route Frequency Ordered Stop   09/18/20 1800  doxycycline (VIBRAMYCIN) 100 mg in sodium chloride 0.9 % 250 mL IVPB        100 mg 125 mL/hr over 120 Minutes Intravenous Every 12 hours 09/18/20 1621     09/18/20 1700  cefTRIAXone (ROCEPHIN) 1 g in sodium chloride 0.9 % 100 mL IVPB        1 g 200 mL/hr over 30 Minutes Intravenous Every 24 hours 09/18/20 1621         PRN meds: acetaminophen, LORazepam, nitroGLYCERIN, OLANZapine   Objective: Vitals:   09/19/20 1225 09/19/20 1445  BP: 133/83 (!) 127/59  Pulse: 94 82  Resp: 16 18  Temp: (!) 97.4 F (36.3 C) (!) 97.5 F (36.4 C)  SpO2: 100% 100%    Intake/Output Summary (Last 24 hours) at 09/19/2020 1607 Last data filed at 09/19/2020 1355 Gross per 24 hour  Intake 100 ml  Output 750 ml  Net -650 ml   Filed Weights   09/18/20 0922  Weight: 90.7 kg   Weight change:  Body mass index is 34.33 kg/m.   Physical Exam: General exam: Pleasant, elderly Caucasian female.  Not in distress Skin: No rashes, lesions or ulcers. HEENT: Atraumatic, normocephalic, no obvious bleeding Lungs: Clear to auscultation bilaterally CVS: Regular rate and rhythm, no murmur GI/Abd soft, nontender, nondistended, bowel sound present CNS: Alert, awake, oriented to place and person.  Slow to respond. Psychiatry: Depressed look Extremities: No pedal edema, no calf tenderness  Data Review: I have personally reviewed the laboratory data and studies available.  Recent Labs  Lab 09/18/20 0922 09/19/20 0604  WBC 9.5 11.7*  NEUTROABS  --  7.8*  HGB 14.7 13.7  HCT 42.3 40.2  MCV 99.8 100.0  PLT 177 168    Recent Labs  Lab 09/18/20 0922 09/19/20 0604  NA 137 143  K 4.1 3.0*  CL 104 112*  CO2 26 25  GLUCOSE 129* 88  BUN 22 18  CREATININE 0.85 0.82  CALCIUM 8.8* 7.7*    F/u labs ordered Unresulted Labs (From admission, onward)     Start     Ordered   09/19/20 1004  MRSA Next Gen by PCR, Nasal  Once,   R        09/19/20 1003   09/19/20 0500  CBC WITH DIFFERENTIAL  Tomorrow morning,   STAT        09/18/20 1514   09/19/20 0500  Procalcitonin  Daily,   STAT      09/18/20  1814   09/18/20 1514  Lactic acid, plasma  STAT Now then every 3 hours,   STAT      09/18/20 1514   09/18/20 1511  Legionella Pneumophila Serogp 1 Ur Ag  Once,   STAT        09/18/20 1514   09/18/20 1510  Expectorated Sputum Assessment w Gram Stain, Rflx to Resp Cult  Once,   STAT        09/18/20 1514            Signed, Terrilee Croak, MD [facility] 09/19/2020

## 2020-09-19 NOTE — Progress Notes (Signed)
PHARMACY - PHYSICIAN COMMUNICATION CRITICAL VALUE ALERT - BLOOD CULTURE IDENTIFICATION (BCID)  Tammy Shepard is an 70 y.o. female who presented to [facility] on 09/18/2020 with a chief complaint of shortness of breath.  Assessment:  1/4 bottles(aerobic) GPC, Staph Species, mecA(-)  Name of physician (or Provider) Contacted: [place], Binaya  Current antibiotics: CTX, Doxycycline   Changes to prescribed antibiotics recommended:  Patient is on recommended antibiotics - No changes needed. Will continue to follow for susceptibilities   Results for orders placed or performed during the hospital encounter of 09/18/20  Blood Culture ID Panel (Reflexed) (Collected: 09/18/2020  9:25 AM)  Result Value Ref Range   Enterococcus faecalis NOT DETECTED NOT DETECTED   Enterococcus Faecium NOT DETECTED NOT DETECTED   Listeria monocytogenes NOT DETECTED NOT DETECTED   Staphylococcus species DETECTED (A) NOT DETECTED   Staphylococcus aureus (BCID) NOT DETECTED NOT DETECTED   Staphylococcus epidermidis NOT DETECTED NOT DETECTED   Staphylococcus lugdunensis NOT DETECTED NOT DETECTED   Streptococcus species NOT DETECTED NOT DETECTED   Streptococcus agalactiae NOT DETECTED NOT DETECTED   Streptococcus pneumoniae NOT DETECTED NOT DETECTED   Streptococcus pyogenes NOT DETECTED NOT DETECTED   A.calcoaceticus-baumannii NOT DETECTED NOT DETECTED   Bacteroides fragilis NOT DETECTED NOT DETECTED   Enterobacterales NOT DETECTED NOT DETECTED   Enterobacter cloacae complex NOT DETECTED NOT DETECTED   Escherichia coli NOT DETECTED NOT DETECTED   Klebsiella aerogenes NOT DETECTED NOT DETECTED   Klebsiella oxytoca NOT DETECTED NOT DETECTED   Klebsiella pneumoniae NOT DETECTED NOT DETECTED   Proteus species NOT DETECTED NOT DETECTED   Salmonella species NOT DETECTED NOT DETECTED   Serratia marcescens NOT DETECTED NOT DETECTED   Haemophilus influenzae NOT DETECTED NOT DETECTED   Neisseria meningitidis NOT DETECTED  NOT DETECTED   Pseudomonas aeruginosa NOT DETECTED NOT DETECTED   Stenotrophomonas maltophilia NOT DETECTED NOT DETECTED   Candida albicans NOT DETECTED NOT DETECTED   Candida auris NOT DETECTED NOT DETECTED   Candida glabrata NOT DETECTED NOT DETECTED   Candida krusei NOT DETECTED NOT DETECTED   Candida parapsilosis NOT DETECTED NOT DETECTED   Candida tropicalis NOT DETECTED NOT DETECTED   Cryptococcus neoformans/gattii NOT DETECTED NOT DETECTED    Shanon Becvar A Fidencia Mccloud 09/19/2020  2:17 PM

## 2020-09-19 NOTE — ED Notes (Signed)
John RN aware of assigned bed 

## 2020-09-19 NOTE — ED Notes (Signed)
Patient continues to maintain oxygen saturations.  Intermittent suction needed after coughing.  Patient able to follow commands and deep cough as needed

## 2020-09-19 NOTE — NC FL2 (Signed)
[place] MEDICAID FL2 LEVEL OF CARE SCREENING TOOL     IDENTIFICATION  Patient Name: Tammy Shepard Birthdate: November 27, 1950 Sex: female Admission Date (Current Location): 09/18/2020  [facility] and [place] Number:  Chiropodist and Address:  [facility], [address], [place], [place] [address]      Provider Number: [id number]  Attending Physician Name and Address:  Lurline Del, MD  Relative Name and Phone Number:  Lucille Passy (guardian) [phone]    Current Level of Care: Hospital Recommended Level of Care: Skilled Nursing Facility Prior Approval Number:    Date Approved/Denied:   PASRR Number: [id number]  Discharge Plan: SNF    Current Diagnoses: Patient Active Problem List   Diagnosis Date Noted   Pressure injury of skin 09/18/2020   CAP (community acquired pneumonia) 09/18/2020   Sepsis secondary to UTI (HCC) 08/29/2016   Sepsis (HCC) 02/25/2016   H/O: CVA (cerebrovascular accident) 11/04/2015   Hypotension 11/04/2015   Essential hypertension, benign 11/04/2015   Fever in adult    Generalized weakness    Recurrent UTI 10/31/2015   Fever 10/31/2015   UTI (lower urinary tract infection) 10/31/2015    Orientation RESPIRATION BLADDER Height & Weight     Self  O2 (2L O2) Incontinent, External catheter Weight: 200 lb (90.7 kg) Height:  5\' 4"  (162.6 cm)  BEHAVIORAL SYMPTOMS/MOOD NEUROLOGICAL BOWEL NUTRITION STATUS      Incontinent Diet (see discharge summary)  AMBULATORY STATUS COMMUNICATION OF NEEDS Skin   Extensive Assist Verbally Other (Comment) (pressure injury stage 2 coccyx)                       Personal Care Assistance Level of Assistance  Bathing, Feeding, Dressing, Total care Bathing Assistance: Limited assistance Feeding assistance: Limited assistance Dressing Assistance: Limited assistance Total Care Assistance: Maximum assistance   Functional Limitations Info  Sight, Hearing, Speech Sight Info:  Adequate Hearing Info: Adequate Speech Info: Adequate    SPECIAL CARE FACTORS FREQUENCY                       Contractures Contractures Info: Not present    Additional Factors Info  Code Status, Allergies Code Status Info: full Allergies Info: Amoxicillin   Ciprofloxacin   Clindamycin Hcl   Penicillins   Codeine   Baclofen   Contrast Media (Iodinated Diagnostic Agents)   Erythromycin   Iodides   Latex   Nickel   Soap   Sulfa Antibiotics   Tape   Tolterodine   Vancomycin           Current Medications (09/19/2020):  This is the current hospital active medication list Current Facility-Administered Medications  Medication Dose Route Frequency Provider Last Rate Last Admin   0.9 %  sodium chloride infusion   Intravenous Continuous 11/19/2020, MD 100 mL/hr at 09/19/20 0403 New Bag at 09/19/20 0403   acetaminophen (TYLENOL) tablet 1,000 mg  1,000 mg Oral Once 11/19/20, MD       acetaminophen (TYLENOL) tablet 650 mg  650 mg Oral Q4H PRN Mansy, Jan A, MD       aspirin chewable tablet 81 mg  81 mg Oral Daily Feb A, MD   81 mg at 09/19/20 0815   cefTRIAXone (ROCEPHIN) 1 g in sodium chloride 0.9 % 100 mL IVPB  1 g Intravenous Q24H 11/19/20, MD   Stopped at 09/18/20 1840   divalproex (DEPAKOTE SPRINKLE) capsule 500  mg  500 mg Oral BID Skip Mayer A, MD       doxycycline (VIBRAMYCIN) 100 mg in sodium chloride 0.9 % 250 mL IVPB  100 mg Intravenous Q12H Lurline Del, MD   Stopped at 09/19/20 0811   insulin aspart (novoLOG) injection 0-6 Units  0-6 Units Subcutaneous TID WC Lurline Del, MD       levothyroxine (SYNTHROID) tablet 150 mcg  150 mcg Oral QAC breakfast Lurline Del, MD       LORazepam (ATIVAN) injection 0.5 mg  0.5 mg Intravenous Q8H PRN Skip Mayer A, MD   0.5 mg at 09/19/20 0815   multivitamin with minerals tablet 1 tablet  1 tablet Oral Daily Skip Mayer A, MD   1 tablet at 09/19/20 0815    nitroGLYCERIN (NITROSTAT) SL tablet 0.4 mg  0.4 mg Sublingual Q5 min PRN Lurline Del, MD       OLANZapine (ZYPREXA) injection 2.5 mg  2.5 mg Intramuscular Q12H PRN Lurline Del, MD       pantoprazole (PROTONIX) injection 40 mg  40 mg Intravenous Daily Skip Mayer A, MD   40 mg at 09/19/20 0906   sodium chloride 0.9 % bolus 1,000 mL  1,000 mL Intravenous Once Merwyn Katos, MD   Stopped at 09/18/20 1847     Discharge Medications: Please see discharge summary for a list of discharge medications.  Relevant Imaging Results:  Relevant Lab Results:   Additional Information SSN: [id number]  Gildardo Griffes, LCSW

## 2020-09-19 NOTE — Progress Notes (Signed)
*  PRELIMINARY RESULTS* Echocardiogram 2D Echocardiogram has been performed.  Tammy Shepard 09/19/2020, 12:18 PM

## 2020-09-20 LAB — GLUCOSE, CAPILLARY
Glucose-Capillary: 86 mg/dL (ref 70–99)
Glucose-Capillary: 122 mg/dL — ABNORMAL HIGH (ref 70–99)
Glucose-Capillary: 85 mg/dL (ref 70–99)
Glucose-Capillary: 100 mg/dL — ABNORMAL HIGH (ref 70–99)

## 2020-09-20 LAB — PROCALCITONIN: Procalcitonin: <0.1 ng/mL

## 2020-09-20 MED ORDER — ENOXAPARIN SODIUM 100 MG/ML IJ SOSY
90.0000 mg | PREFILLED_SYRINGE | Freq: Two times a day (BID) | INTRAMUSCULAR | Status: DC
  Administered 2020-09-20 – 2020-09-22 (×3): 90 mg via SUBCUTANEOUS
  Filled 2020-09-20 (×4): qty 0.9

## 2020-09-20 MED ORDER — FUROSEMIDE 20 MG PO TABS
20.0000 mg | ORAL_TABLET | Freq: Every day | ORAL | Status: DC
  Administered 2020-09-20 – 2020-09-22 (×3): 20 mg via ORAL
  Filled 2020-09-20 (×3): qty 1

## 2020-09-20 MED ORDER — DOXYCYCLINE HYCLATE 100 MG PO TABS
100.0000 mg | ORAL_TABLET | Freq: Two times a day (BID) | ORAL | Status: DC
  Administered 2020-09-20 – 2020-09-22 (×4): 100 mg via ORAL
  Filled 2020-09-20 (×4): qty 1

## 2020-09-20 NOTE — Progress Notes (Signed)
ANTICOAGULATION CONSULT NOTE - Initial Consult  Pharmacy Consult for enoxaparin Indication:  possible DVT/rule  Allergies  Allergen Reactions   Amoxicillin Rash    Tolerating rocephin 10/29/15   Ciprofloxacin Anaphylaxis   Clindamycin Hcl Swelling, Rash and Other (See Comments)    Other Reaction: BAD RASH AND SWELLING, SKIN BR Severe rash and swelling. Skin break down.   Penicillins Anaphylaxis    Tolerating rocephin 11/01/15   Codeine Nausea And Vomiting   Baclofen Itching and Rash   Contrast Media [Iodinated Diagnostic Agents] Itching and Rash   Erythromycin Rash   Iodides Rash    Povidone iodine; betadine   Latex Itching and Rash   Nickel Rash and Other (See Comments)    OTHER REACTION   Soap Itching and Rash   Sulfa Antibiotics Rash   Tape Rash    (silk tape only) skin break down   Tolterodine Rash   Vancomycin Rash    Patient Measurements: Height: 5\' 4"  (162.6 cm) Weight: 90.7 kg (200 lb) IBW/kg (Calculated) : 54.7  Vital Signs: Temp: 98.1 F (36.7 C) (08/13 1152) Temp Source: Oral (08/13 1152) BP: 141/72 (08/13 1152) Pulse Rate: 66 (08/13 1152)  Labs: Recent Labs    09/18/20 0922 09/18/20 1112 09/19/20 0604  HGB 14.7  --  13.7  HCT 42.3  --  40.2  PLT 177  --  168  CREATININE 0.85  --  0.82  TROPONINIHS 7 15  --     Estimated Creatinine Clearance: 71.6 mL/min (by C-G formula based on SCr of 0.82 mg/dL).   Medical History: Past Medical History:  Diagnosis Date   Asthma    CVA (cerebrovascular accident due to intracerebral hemorrhage) (HCC)    Diabetes mellitus without complication (HCC)    Hyperlipemia    Hypertension    Seizures (HCC)    Stroke (HCC)     Medications:  Medications Prior to Admission  Medication Sig Dispense Refill Last Dose   amLODipine (NORVASC) 10 MG tablet Take 10 mg by mouth daily.   09/18/2020 at 0730   ARIPiprazole (ABILIFY) 5 MG tablet Take 5 mg by mouth daily.    09/18/2020 at 0730   aspirin 81 MG chewable tablet  Chew 81 mg by mouth daily.   09/18/2020 at 0730   atorvastatin (LIPITOR) 40 MG tablet Take 40 mg by mouth every morning.   09/18/2020 at 0730   clonazePAM (KLONOPIN) 0.5 MG tablet Take 0.5 mg by mouth 2 (two) times daily.   09/18/2020 at 0730   divalproex (DEPAKOTE SPRINKLE) 125 MG capsule Take 500 mg by mouth 2 (two) times daily.   09/18/2020 at 0730   FLUoxetine (PROZAC) 40 MG capsule Take 40 mg by mouth 2 (two) times daily.    09/18/2020 at 0730   levothyroxine (SYNTHROID) 150 MCG tablet Take 150 mcg by mouth daily before breakfast.    09/18/2020 at 0630   losartan (COZAAR) 50 MG tablet Take 50 mg by mouth 2 (two) times daily.  0 09/18/2020 at 0730   metFORMIN (GLUCOPHAGE) 500 MG tablet Take 500 mg by mouth daily at 12 noon.   09/18/2020 at 0730   metoprolol tartrate (LOPRESSOR) 25 MG tablet Take 25 mg by mouth 2 (two) times daily.   09/18/2020 at 0730   Multiple Vitamin (MULTIVITAMIN WITH MINERALS) TABS tablet Take 1 tablet by mouth daily.   09/18/2020 at 0730   omeprazole (PRILOSEC) 10 MG capsule Take 10 mg by mouth daily.  1 09/18/2020 at 0730   oxyCODONE-acetaminophen (  PERCOCET/ROXICET) 5-325 MG tablet Take 1 tablet by mouth 2 (two) times daily.   09/18/2020 at 0730   risperiDONE (RISPERDAL) 0.5 MG tablet Take 0.5 mg by mouth at bedtime.   09/17/2020 at 1930   senna (SENOKOT) 8.6 MG tablet Take 2 tablets by mouth 2 (two) times daily as needed for constipation.   09/18/2020 at 0730   White Petrolatum-Mineral Oil (REFRESH P.M. OP) Apply 1 inch to eye at bedtime as needed (dry eyes).   unknown at prn   divalproex (DEPAKOTE SPRINKLE) 125 MG capsule Take 750 mg by mouth at bedtime.      nitroGLYCERIN (NITROSTAT) 0.4 MG SL tablet Place 0.4 mg under the tongue every 5 (five) minutes as needed for chest pain.   unknown at prn   polyethylene glycol (MIRALAX / GLYCOLAX) packet Take 17 g by mouth daily.   unknown at prn   Scheduled:   aspirin  81 mg Oral Daily   divalproex  500 mg Oral BID   doxycycline  100 mg  Oral Q12H   furosemide  20 mg Oral Daily   insulin aspart  0-6 Units Subcutaneous TID WC   levothyroxine  150 mcg Oral QAC breakfast   metoprolol tartrate  25 mg Oral BID   multivitamin with minerals  1 tablet Oral Daily   pantoprazole (PROTONIX) IV  40 mg Intravenous Daily   Infusions:   cefTRIAXone (ROCEPHIN)  IV 1 g (09/19/20 1727)   PRN: acetaminophen, LORazepam, nitroGLYCERIN, OLANZapine Anti-infectives (From admission, onward)    Start     Dose/Rate Route Frequency Ordered Stop   09/20/20 1800  doxycycline (VIBRA-TABS) tablet 100 mg        100 mg Oral Every 12 hours 09/20/20 1432     09/18/20 1800  doxycycline (VIBRAMYCIN) 100 mg in sodium chloride 0.9 % 250 mL IVPB  Status:  Discontinued        100 mg 125 mL/hr over 120 Minutes Intravenous Every 12 hours 09/18/20 1621 09/20/20 1432   09/18/20 1700  cefTRIAXone (ROCEPHIN) 1 g in sodium chloride 0.9 % 100 mL IVPB        1 g 200 mL/hr over 30 Minutes Intravenous Every 24 hours 09/18/20 1621         Assessment: 69YOF with chief complaint of shortness of breath. Starting treatment dose enoxaparin for possible DVT/ruling out PE. US showed potential thrombus in right posterior tibial veins in the right calf. MD to get VQ scan to r/o possible PE. Pharmacy consulted to start therapeutic Lovenox for now pending workup.  Hemoglobin and platelets stable, no new bleed per notes.  Goal of Therapy:  Monitor platelets by anticoagulation protocol: Yes   Plan:  Lovenox 90 mg every 12 hours. Monitor Hgb, platelets, and s/sx of new bleed.    Jaynie Bream, PharmD Pharmacy Resident  09/20/2020 3:22 PM

## 2020-09-20 NOTE — Progress Notes (Signed)
PHARMACIST - PHYSICIAN COMMUNICATION  CONCERNING: Antibiotic IV to Oral Route Change Policy  RECOMMENDATION: This patient is receiving doxycycline by the intravenous route.  Based on criteria approved by the Pharmacy and Therapeutics Committee, the antibiotic(s) is/are being converted to the equivalent oral dose form(s).   DESCRIPTION: These criteria include: Patient being treated for a respiratory tract infection, urinary tract infection, cellulitis or clostridium difficile associated diarrhea if on metronidazole The patient is not neutropenic and does not exhibit a GI malabsorption state The patient is eating (either orally or via tube) and/or has been taking other orally administered medications for a least 24 hours The patient is improving clinically and has a Tmax < 100.5  If you have questions about this conversion, please contact the Pharmacy Department   Tressie Ellis  09/20/20

## 2020-09-20 NOTE — Progress Notes (Signed)
PROGRESS NOTE  Tammy Shepard  DOB: 1950-09-07  PCP: Kirk Ruths, MD EXH:[id number]  DOA: 09/18/2020  LOS: 2 days  Hospital Day: 3   Chief Complaint  Patient presents with   Respiratory Distress    Brief narrative: Tammy Shepard is a 70 y.o. 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 Patient was brought to the ED from [facility] with complaint of "sob and foaming from mouth".  She was noted to have low oxygen saturations in the 70s on room air and hence brought to the ED on high flow oxygen.  In the ED, patient was transitioned to BiPAP While in the ED, patient had a fever of 101.7, she was tachycardic to 100s Labs with WBC count 9.5, lactic acid level and procalcitonin level normal Respiratory virus panel negative. CT chest with mild right posterior basilar opacity concerning for atelectasis or pneumonia, small left pleural effusion with adjacent subsegmental atelectasis.  4 mm nodule in the left lower lobe Patient was admitted to hospitalist service. See below for details.  Subjective: Patient was seen and examined this morning.  Elderly Caucasian female.   Feels better.  Propped up in bed.  Not in distress.  On 4 L oxygen today, was on 2 L yesterday..  No fever last 24 hours.    Assessment/Plan: Sepsis_POA Healthcare associated pneumonia -Presented with shortness of breath -Met sepsis criteria with fever, tachycardia, pneumonia -Blood culture sent.  Currently on broad-spectrum antibiotic coverage with IV Rocephin and IV azithromycin -Continue maintenance IV fluid.  Gram-positive bacteremia -1 out of 4 bottle of blood culture grew staph species, likely contamination. -Covered anyway on IV Rocephin and doxycycline this time for pneumonia.  Chronic diastolic CHF Essential hypertension -Echo 8/2 showed EF 55 to 60% with grade 3 diastolic dysfunction -Home meds include  amlodipine 10 mg daily, losartan 50 mg twice daily, metoprolol 25 mg twice daily -Currently on metoprolol 25 twice daily.  Amlodipine losartan on hold.  Blood pressure stable.   -Since her oxygen requirement is increasing, I would start her on Lasix 20 mg oral daily.  Acute respiratory with hypoxia -Initially required BiPAP.  Currently on 4 L oxygen by nasal cannula. -Wean down as tolerated.  Abnormal ultrasound duplex of legs  -Unclear findings but suspected to have right leg DVT. -In the face of worsening oxygen requirement, I will obtain a VQ scan to rule out PE.  Unable to do CT angio because of mention of history of allergy to contrast. -For now, will start on full dose anticoagulation.  Discussed with pharmacy.  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     Diet:  Diet Order             DIET DYS 3 Room service appropriate? Yes with Assist; Fluid consistency: Thin  Diet effective now  DVT prophylaxis: Start on Lovenox subcu   Antimicrobials: Ceftriaxone, doxycycline Fluid: None Consultants: None Family Communication: None at bedside  Status is: Inpatient  Remains inpatient appropriate because: Needs IV antibiotics  Dispo: The patient is from: [employer] d/c is to: Back to [facility] in 2 to 3  days              Patient currently is not medically stable to d/c.   Difficult to place patient No     Infusions:   cefTRIAXone (ROCEPHIN)  IV 1 g (09/19/20 1727)    Scheduled Meds:  aspirin  81 mg Oral Daily   divalproex  500 mg Oral BID   doxycycline  100 mg Oral Q12H   furosemide  20 mg Oral Daily   insulin aspart  0-6 Units Subcutaneous TID WC   levothyroxine  150 mcg Oral QAC breakfast   metoprolol tartrate  25 mg Oral BID   multivitamin with minerals  1 tablet Oral Daily   pantoprazole (PROTONIX) IV  40 mg Intravenous Daily    Antimicrobials: Anti-infectives (From admission, onward)    Start     Dose/Rate Route Frequency Ordered Stop   09/20/20 1800  doxycycline (VIBRA-TABS) tablet 100 mg        100 mg Oral Every 12 hours 09/20/20 1432     09/18/20 1800  doxycycline (VIBRAMYCIN) 100 mg in sodium chloride 0.9 % 250 mL IVPB  Status:  Discontinued        100 mg 125 mL/hr over 120 Minutes Intravenous Every 12 hours 09/18/20 1621 09/20/20 1432   09/18/20 1700  cefTRIAXone (ROCEPHIN) 1 g in sodium chloride 0.9 % 100 mL IVPB        1 g 200 mL/hr over 30 Minutes Intravenous Every 24 hours 09/18/20 1621         PRN meds: acetaminophen, LORazepam, nitroGLYCERIN, OLANZapine   Objective: Vitals:   09/20/20 0804 09/20/20 1152  BP: (!) 127/116 (!) 141/72  Pulse: 73 66  Resp: 18 16  Temp: 98.2 F (36.8 C) 98.1 F (36.7 C)  SpO2: 98% 98%    Intake/Output Summary (Last 24 hours) at 09/20/2020 1438 Last data filed at 09/20/2020 0400 Gross per 24 hour  Intake 400 ml  Output --  Net 400 ml   Filed Weights   09/18/20 0922  Weight: 90.7 kg   Weight change:  Body mass index is 34.33 kg/m.   Physical Exam: General exam: Pleasant, elderly Caucasian female.  Not in distress.  Requiring 4 L oxygen by nasal cannula today Skin: No rashes, lesions or ulcers. HEENT: Atraumatic, normocephalic, no obvious bleeding Lungs: Diminished air entry both bases, clear to  auscultation bilaterally CVS: Regular rate and rhythm, no murmur GI/Abd soft, nontender, nondistended, bowel sound present CNS: Alert, awake, oriented to place and person.  Slow to respond. Psychiatry: Depressed look Extremities: No pedal edema  Data Review: I have personally reviewed the laboratory data and studies available.  Recent Labs  Lab 09/18/20 0922 09/19/20 0604  WBC 9.5 11.7*  NEUTROABS  --  7.8*  HGB 14.7 13.7  HCT 42.3 40.2  MCV 99.8 100.0  PLT 177 168   Recent Labs  Lab 09/18/20 0922 09/19/20 0604  NA 137 143  K 4.1 3.0*  CL 104 112*  CO2 26 25  GLUCOSE 129* 88  BUN 22 18  CREATININE 0.85 0.82  CALCIUM 8.8* 7.7*  F/u labs ordered Unresulted Labs (From admission, onward)     Start     Ordered   09/21/20 2707  Basic metabolic panel  Tomorrow morning,   R        09/20/20 1434   09/21/20 0500  Magnesium  Tomorrow morning,   R        09/20/20 1434   09/19/20 0500  CBC WITH DIFFERENTIAL  Tomorrow morning,   STAT        09/18/20 1514   09/18/20 1511  Legionella Pneumophila Serogp 1 Ur Ag  Once,   STAT        09/18/20 1514   09/18/20 1510  Expectorated Sputum Assessment w Gram Stain, Rflx to Resp Cult  Once,   STAT        09/18/20 1514            Signed, Terrilee Croak, MD [facility] 09/20/2020

## 2020-09-21 LAB — BASIC METABOLIC PANEL
Anion gap: 8 (ref 5–15)
BUN: 22 mg/dL (ref 8–23)
CO2: 26 mmol/L (ref 22–32)
Calcium: 8.7 mg/dL — ABNORMAL LOW (ref 8.9–10.3)
Chloride: 108 mmol/L (ref 98–111)
Creatinine, Ser: 0.76 mg/dL (ref 0.44–1.00)
GFR, Estimated: >60 mL/min (ref >60)
Glucose, Bld: 97 mg/dL (ref 70–99)
Potassium: 3.8 mmol/L (ref 3.5–5.1)
Sodium: 142 mmol/L (ref 135–145)

## 2020-09-21 LAB — GLUCOSE, CAPILLARY
Glucose-Capillary: 91 mg/dL (ref 70–99)
Glucose-Capillary: 141 mg/dL — ABNORMAL HIGH (ref 70–99)
Glucose-Capillary: 119 mg/dL — ABNORMAL HIGH (ref 70–99)
Glucose-Capillary: 120 mg/dL — ABNORMAL HIGH (ref 70–99)

## 2020-09-21 LAB — MAGNESIUM: Magnesium: 1.8 mg/dL (ref 1.7–2.4)

## 2020-09-21 LAB — LEGIONELLA PNEUMOPHILA SEROGP 1 UR AG: L. pneumophila Serogp 1 Ur Ag: NEGATIVE

## 2020-09-21 MED ORDER — PHENOL 1.4 % MT LIQD
1.0000 | OROMUCOSAL | Status: DC | PRN
  Administered 2020-09-21: 1 via OROMUCOSAL
  Filled 2020-09-21: qty 177

## 2020-09-21 NOTE — Progress Notes (Signed)
[place] PROGRESS NOTE  Tammy Shepard  DOB: [birth year]  PCP: Kirk Ruths, MD ZOX:[id number]  DOA: 09/18/2020  LOS: 3 days  Hospital Day: 4   Chief Complaint  Patient presents with   Respiratory Distress    Brief narrative: Tammy Shepard is a 70 y.o. 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 Patient was brought to the ED from [facility] with complaint of "sob and foaming from mouth".  She was noted to have low oxygen saturations in the 70s on room air and hence brought to the ED on high flow oxygen.  In the ED, patient was transitioned to BiPAP While in the ED, patient had a fever of 101.7, she was tachycardic to 100s Labs with WBC count 9.5, lactic acid level and procalcitonin level normal Respiratory virus panel negative. CT chest with mild right posterior basilar opacity concerning for atelectasis or pneumonia, small left pleural effusion with adjacent subsegmental atelectasis.  4 mm nodule in the left lower lobe Patient was admitted to hospitalist service. See below for details.  Subjective: Patient was seen and examined this morning.   Propped up in bed.  Not in distress.  Being fed by nursing staff.  On 2 L oxygen by nasal cannula which is better compared to yesterday.    Assessment/Plan: Sepsis_POA Healthcare associated pneumonia -Presented with shortness of breath -Met sepsis criteria with fever, tachycardia, pneumonia -Blood culture sent.  Currently on broad-spectrum antibiotic coverage with IV Rocephin and IV azithromycin -Continue maintenance IV fluid.  Gram-positive bacteremia -1 out of 4 bottle of blood culture grew staph species, likely contamination. -Covered anyway on IV Rocephin and doxycycline this time for pneumonia.  Chronic diastolic CHF Essential hypertension -Echo 8/2 showed EF 55 to 60% with grade 3 diastolic dysfunction -Home meds include  amlodipine 10 mg daily, losartan 50 mg twice daily, metoprolol 25 mg twice daily -Currently on metoprolol 25 twice daily and Lasix 20 mg oral daily..  Amlodipine and losartan on hold.  Blood pressure stable.    Acute respiratory with hypoxia -Initially required BiPAP.  Currently on 4 L oxygen by nasal cannula. -Wean down as tolerated.  New Right leg DVT -Right leg DVT suspected based on ultrasound duplex sent on admission.  Currently on full dose anticoagulation with Lovenox subcu.  Respiratory status stable today.  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     Diet:  Diet Order             DIET DYS 3 Room service appropriate? Yes with Assist; Fluid consistency: Thin  Diet effective now                  DVT prophylaxis: Start on Lovenox subcu   Antimicrobials: Ceftriaxone, doxycycline Fluid: None Consultants: None Family Communication: None at bedside  Status is: Inpatient  Remains inpatient appropriate because: Pending discharge to SNF  Dispo: The patient is from: [employer] d/c is to: Back to [facility] in 1 to 2 days              Patient currently is medically stable to d/c.   Difficult to place patient No     Infusions:   cefTRIAXone (ROCEPHIN)  IV 1 g (09/20/20 1730)    Scheduled Meds:  aspirin  81 mg  Oral Daily   divalproex  500 mg Oral BID   doxycycline  100 mg Oral Q12H   enoxaparin (LOVENOX) injection  90 mg Subcutaneous Q12H   furosemide  20 mg Oral Daily   insulin aspart  0-6 Units Subcutaneous TID WC   levothyroxine  150 mcg Oral QAC breakfast   metoprolol tartrate  25 mg Oral BID   multivitamin with minerals  1 tablet Oral Daily   pantoprazole (PROTONIX) IV  40 mg Intravenous Daily    Antimicrobials: Anti-infectives (From admission, onward)    Start     Dose/Rate Route Frequency Ordered Stop   09/20/20 1800  doxycycline (VIBRA-TABS) tablet 100 mg        100 mg Oral Every 12 hours 09/20/20 1432     09/18/20 1800  doxycycline (VIBRAMYCIN) 100 mg in sodium chloride 0.9 % 250 mL IVPB  Status:  Discontinued        100 mg 125 mL/hr over 120 Minutes Intravenous Every 12 hours 09/18/20 1621 09/20/20 1432   09/18/20 1700  cefTRIAXone (ROCEPHIN) 1 g in sodium chloride 0.9 % 100 mL IVPB        1 g 200 mL/hr over 30 Minutes Intravenous Every 24 hours 09/18/20 1621         PRN meds: acetaminophen, LORazepam, nitroGLYCERIN, OLANZapine, phenol   Objective: Vitals:   09/21/20 0807 09/21/20 1219  BP: (!) 144/74 (!) 122/54  Pulse: 77 74  Resp: 15 17  Temp: 98 F (36.7 C) 97.8 F (36.6 C)  SpO2: 97% 97%    Intake/Output Summary (Last 24 hours) at 09/21/2020 1419 Last data filed at 09/21/2020 0930 Gross per 24 hour  Intake 360 ml  Output 950 ml  Net -590 ml    Filed Weights   09/18/20 0922  Weight: 90.7 kg   Weight change:  Body mass index is 34.33 kg/m.   Physical Exam: General exam: Pleasant, elderly Caucasian female.  Not in distress.  Requiring 4 L oxygen by nasal cannula today. Skin: No rashes, lesions or ulcers. HEENT: Atraumatic, normocephalic, no obvious bleeding Lungs: Clear to auscultation bilaterally CVS: Regular rate and rhythm, no murmur GI/Abd soft, nontender, nondistended, bowel sound present CNS: Alert, awake, oriented to place and person.  Slow  to respond. Psychiatry: Depressed look Extremities: No pedal edema contracted left upper extremity  Data Review: I have personally reviewed the laboratory data and studies available.  Recent Labs  Lab 09/18/20 0922 09/19/20 0604  WBC 9.5 11.7*  NEUTROABS  --  7.8*  HGB 14.7 13.7  HCT 42.3 40.2  MCV 99.8 100.0  PLT 177 168    Recent Labs  Lab 09/18/20 0922 09/19/20 0604 09/21/20 0613  NA 137 143 142  K 4.1 3.0* 3.8  CL 104 112* 108  CO2 $Re'26 25 26  'CxC$ GLUCOSE 129* 88 97  BUN $Re'22 18 22  'WZZ$ CREATININE 0.85 0.82 0.76  CALCIUM 8.8* 7.7* 8.7*  MG  --   --  1.8  F/u labs ordered Unresulted Labs (From admission, onward)     Start     Ordered   09/22/20 0500  CBC  Tomorrow morning,   R        09/21/20 1057   09/19/20 0500  CBC WITH DIFFERENTIAL  Tomorrow morning,   STAT        09/18/20 1514   09/18/20 1511  Legionella Pneumophila Serogp 1 Ur Ag  Once,   STAT        09/18/20 1514            Signed, Terrilee Croak, MD [facility] 09/21/2020

## 2020-09-22 LAB — RESP PANEL BY RT-PCR (FLU A&B, COVID) ARPGX2
Influenza A by PCR: NEGATIVE
Influenza B by PCR: NEGATIVE
SARS Coronavirus 2 by RT PCR: NEGATIVE

## 2020-09-22 LAB — CBC
HCT: 36.3 % (ref 36.0–46.0)
Hemoglobin: 12.3 g/dL (ref 12.0–15.0)
MCH: 33.8 pg (ref 26.0–34.0)
MCHC: 33.9 g/dL (ref 30.0–36.0)
MCV: 99.7 fL (ref 80.0–100.0)
Platelets: 159 10*3/uL (ref 150–400)
RBC: 3.64 MIL/uL — ABNORMAL LOW (ref 3.87–5.11)
RDW: 14.7 % (ref 11.5–15.5)
WBC: 7.8 10*3/uL (ref 4.0–10.5)
nRBC: 0 % (ref 0.0–0.2)

## 2020-09-22 LAB — GLUCOSE, CAPILLARY
Glucose-Capillary: 93 mg/dL (ref 70–99)
Glucose-Capillary: 108 mg/dL — ABNORMAL HIGH (ref 70–99)
Glucose-Capillary: 105 mg/dL — ABNORMAL HIGH (ref 70–99)

## 2020-09-22 MED ORDER — APIXABAN 5 MG PO TABS: 10.0000 mg | ORAL_TABLET | Freq: Two times a day (BID) | ORAL | Status: DC

## 2020-09-22 MED ORDER — FUROSEMIDE 20 MG PO TABS: 20.0000 mg | ORAL_TABLET | Freq: Every day | ORAL | Status: DC | PRN

## 2020-09-22 MED ORDER — APIXABAN 5 MG PO TABS: 5.0000 mg | ORAL_TABLET | Freq: Two times a day (BID) | ORAL | Status: DC

## 2020-09-22 MED ORDER — CEFDINIR 300 MG PO CAPS
300.0000 mg | ORAL_CAPSULE | Freq: Two times a day (BID) | ORAL | Status: DC
  Administered 2020-09-22: 300 mg via ORAL
  Filled 2020-09-22 (×2): qty 1

## 2020-09-22 MED ORDER — APIXABAN 5 MG PO TABS: 5.0000 mg | ORAL_TABLET | Freq: Two times a day (BID) | ORAL | Status: AC

## 2020-09-22 MED ORDER — RISAQUAD PO CAPS: 2.0000 | ORAL_CAPSULE | Freq: Three times a day (TID) | ORAL | Status: AC

## 2020-09-22 MED ORDER — RISAQUAD PO CAPS
2.0000 | ORAL_CAPSULE | Freq: Three times a day (TID) | ORAL | Status: DC
  Administered 2020-09-22: 2 via ORAL
  Filled 2020-09-22: qty 2

## 2020-09-22 MED ORDER — CEFDINIR 300 MG PO CAPS: 300.0000 mg | ORAL_CAPSULE | Freq: Two times a day (BID) | ORAL | Status: AC

## 2020-09-22 NOTE — Care Management Important Message (Signed)
Important Message  Patient Details  Name: Tammy Shepard MRN: [id number] Date of Birth: [birth year]   Medicare Important Message Given:  Other (see comment)  I called the patient's legal guardian, Reynaldo Minium [phone]) but her voicemail is full and message is to call her supervisor Sharlyne Cai at [phone] at [facility]. I left a VM for Sharlyne Cai to call me at her earliest convenience so I could review it with her and fax over for a signature.    Olegario Messier A Amey Hossain 09/22/2020, 1:17 PM

## 2020-09-22 NOTE — Progress Notes (Signed)
ANTICOAGULATION CONSULT NOTE - Initial Consult  Pharmacy Consult for enoxaparin Indication:  possible DVT/rule  Allergies  Allergen Reactions   Amoxicillin Rash    Tolerating rocephin 10/29/15   Ciprofloxacin Anaphylaxis   Clindamycin Hcl Swelling, Rash and Other (See Comments)    Other Reaction: BAD RASH AND SWELLING, SKIN BR Severe rash and swelling. Skin break down.   Penicillins Anaphylaxis    Tolerating rocephin 11/01/15   Codeine Nausea And Vomiting   Baclofen Itching and Rash   Contrast Media [Iodinated Diagnostic Agents] Itching and Rash   Erythromycin Rash   Iodides Rash    Povidone iodine; betadine   Latex Itching and Rash   Nickel Rash and Other (See Comments)    OTHER REACTION   Soap Itching and Rash   Sulfa Antibiotics Rash   Tape Rash    (silk tape only) skin break down   Tolterodine Rash   Vancomycin Rash    Patient Measurements: Height: 5\' 4"  (162.6 cm) Weight: 90.7 kg (200 lb) IBW/kg (Calculated) : 54.7  Vital Signs: Temp: 98.4 F (36.9 C) (08/15 0725) Temp Source: Axillary (08/15 0725) BP: 132/78 (08/15 0725) Pulse Rate: 67 (08/15 0725)  Labs: Recent Labs    09/21/20 0613 09/22/20 0520  HGB  --  12.3  HCT  --  36.3  PLT  --  159  CREATININE 0.76  --      Estimated Creatinine Clearance: 73.4 mL/min (by C-G formula based on SCr of 0.76 mg/dL).   Medical History: Past Medical History:  Diagnosis Date   Asthma    CVA (cerebrovascular accident due to intracerebral hemorrhage) (HCC)    Diabetes mellitus without complication (HCC)    Hyperlipemia    Hypertension    Seizures (HCC)    Stroke (HCC)     Medications:  Medications Prior to Admission  Medication Sig Dispense Refill Last Dose   amLODipine (NORVASC) 10 MG tablet Take 10 mg by mouth daily.   09/18/2020 at 0730   ARIPiprazole (ABILIFY) 5 MG tablet Take 5 mg by mouth daily.    09/18/2020 at 0730   aspirin 81 MG chewable tablet Chew 81 mg by mouth daily.   09/18/2020 at 0730    atorvastatin (LIPITOR) 40 MG tablet Take 40 mg by mouth every morning.   09/18/2020 at 0730   clonazePAM (KLONOPIN) 0.5 MG tablet Take 0.5 mg by mouth 2 (two) times daily.   09/18/2020 at 0730   divalproex (DEPAKOTE SPRINKLE) 125 MG capsule Take 500 mg by mouth 2 (two) times daily.   09/18/2020 at 0730   FLUoxetine (PROZAC) 40 MG capsule Take 40 mg by mouth 2 (two) times daily.    09/18/2020 at 0730   levothyroxine (SYNTHROID) 150 MCG tablet Take 150 mcg by mouth daily before breakfast.    09/18/2020 at 0630   losartan (COZAAR) 50 MG tablet Take 50 mg by mouth 2 (two) times daily.  0 09/18/2020 at 0730   metFORMIN (GLUCOPHAGE) 500 MG tablet Take 500 mg by mouth daily at 12 noon.   09/18/2020 at 0730   metoprolol tartrate (LOPRESSOR) 25 MG tablet Take 25 mg by mouth 2 (two) times daily.   09/18/2020 at 0730   Multiple Vitamin (MULTIVITAMIN WITH MINERALS) TABS tablet Take 1 tablet by mouth daily.   09/18/2020 at 0730   omeprazole (PRILOSEC) 10 MG capsule Take 10 mg by mouth daily.  1 09/18/2020 at 0730   oxyCODONE-acetaminophen (PERCOCET/ROXICET) 5-325 MG tablet Take 1 tablet by mouth 2 (two) times  daily.   09/18/2020 at 0730   risperiDONE (RISPERDAL) 0.5 MG tablet Take 0.5 mg by mouth at bedtime.   09/17/2020 at 1930   senna (SENOKOT) 8.6 MG tablet Take 2 tablets by mouth 2 (two) times daily as needed for constipation.   09/18/2020 at 0730   White Petrolatum-Mineral Oil (REFRESH P.M. OP) Apply 1 inch to eye at bedtime as needed (dry eyes).   unknown at prn   divalproex (DEPAKOTE SPRINKLE) 125 MG capsule Take 750 mg by mouth at bedtime.      nitroGLYCERIN (NITROSTAT) 0.4 MG SL tablet Place 0.4 mg under the tongue every 5 (five) minutes as needed for chest pain.   unknown at prn   polyethylene glycol (MIRALAX / GLYCOLAX) packet Take 17 g by mouth daily.   unknown at prn   Scheduled:   acidophilus  2 capsule Oral TID   cefdinir  300 mg Oral Q12H   divalproex  500 mg Oral BID   doxycycline  100 mg Oral Q12H    enoxaparin (LOVENOX) injection  90 mg Subcutaneous Q12H   furosemide  20 mg Oral Daily   insulin aspart  0-6 Units Subcutaneous TID WC   levothyroxine  150 mcg Oral QAC breakfast   metoprolol tartrate  25 mg Oral BID   multivitamin with minerals  1 tablet Oral Daily   pantoprazole (PROTONIX) IV  40 mg Intravenous Daily   Infusions:    PRN: acetaminophen, LORazepam, nitroGLYCERIN, OLANZapine, phenol Anti-infectives (From admission, onward)    Start     Dose/Rate Route Frequency Ordered Stop   09/22/20 1000  cefdinir (OMNICEF) capsule 300 mg        300 mg Oral Every 12 hours 09/22/20 0756 09/25/20 0959   09/20/20 1800  doxycycline (VIBRA-TABS) tablet 100 mg        100 mg Oral Every 12 hours 09/20/20 1432     09/18/20 1800  doxycycline (VIBRAMYCIN) 100 mg in sodium chloride 0.9 % 250 mL IVPB  Status:  Discontinued        100 mg 125 mL/hr over 120 Minutes Intravenous Every 12 hours 09/18/20 1621 09/20/20 1432   09/18/20 1700  cefTRIAXone (ROCEPHIN) 1 g in sodium chloride 0.9 % 100 mL IVPB  Status:  Discontinued        1 g 200 mL/hr over 30 Minutes Intravenous Every 24 hours 09/18/20 1621 09/22/20 0756       Assessment: 69YOF with chief complaint of shortness of breath. US showed potential thrombus in right posterior tibial veins in the right calf. CT negative for PE. Pharmacy consulted to start Eliquis for DVT.  Hemoglobin and platelets stable, no new bleed per notes.  Goal of Therapy:  Monitor platelets by anticoagulation protocol: Yes   Plan:  Stop lovenox and start Eliquis today @ 1800 Monitor for s/s of bleed and at least weekly CBC and SCr   Derrek Gu, PharmD 09/22/2020 8:11 AM

## 2020-09-22 NOTE — TOC Transition Note (Addendum)
Transition of Care [facility]) - CM/SW Discharge Note   Patient Details  Name: Tammy Shepard MRN: [id number] Date of Birth: 1950/08/06  Transition of [facility] CM/SW Contact:  Gildardo Griffes, LCSW Phone Number: 09/22/2020, 11:54 AM   Clinical Narrative:     Patient will DC to: [facility] Anticipated DC date: 09/22/20 Family notified:  Lucille Passy legal guardian- lvm on work number as cell vm is full Transport by: [facility]  Per MD patient ready for DC to [facility] . RN, patient, patient's family, and facility notified of DC. Discharge Summary sent to facility. RN given number for report   [phone] Room 102A. DC packet on chart. Ambulance transport requested for patient.  CSW signing off.  Angeline Slim, LCSW    Final next level of care: Skilled Nursing Facility Barriers to Discharge: No Barriers Identified   Patient Goals and CMS Choice Patient states their goals for this hospitalization and ongoing recovery are:: to go to SNF CMS Medicare.gov Compare Post Acute Care list provided to:: Patient Choice offered to / list presented to : Patient  Discharge Placement                    Patient and family notified of of transfer: 09/22/20  Discharge Plan and Services     Post Acute Care Choice: Skilled Nursing Facility                               Social Determinants of Health (SDOH) Interventions     Readmission Risk Interventions No flowsheet data found.

## 2020-09-22 NOTE — Care Management Important Message (Signed)
Important Message  Patient Details  Name: Tammy Shepard MRN: [id number] Date of Birth: 04-27-1950   Medicare Important Message Given:  Yes  Reviewed the Important Message from Medicare by phone with Quintin Alto Drewery [phone]). I have sent a copy via secure e-mail to: [email or web address] and asked that she sign & date and return to me at her earliest convenience. I thanked her for her time.   Olegario Messier A Evelyn Moch 09/22/2020, 3:50 PM

## 2020-09-22 NOTE — Discharge Summary (Addendum)
Physician Discharge Summary  Tammy Shepard OHY:[id number] DOB: Nov 09, 1950 DOA: 09/18/2020  PCP: Kirk Ruths, MD  Admit date: 09/18/2020 Discharge date: 09/22/2020  Admitted From: [facility] Discharge disposition: Back to [facility]   Code Status: Full Code   Discharge Diagnosis:   Active Problems:   Pressure injury of skin   CAP (community acquired pneumonia)    Chief Complaint  Patient presents with   Respiratory Distress    Brief narrative: Tammy Shepard is a 70 y.o. 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 Patient was brought to the ED from [facility] with complaint of "sob and foaming from mouth".  She was noted to have low oxygen saturations in the 70s on room air and hence brought to the ED on high flow oxygen.  In the ED, patient was transitioned to BiPAP While in the ED, patient had a fever of 101.7, she was tachycardic to 100s Labs with WBC count 9.5, lactic acid level and procalcitonin level normal Respiratory virus panel negative. CT chest with mild right posterior basilar opacity concerning for atelectasis or pneumonia, small left pleural effusion with adjacent subsegmental atelectasis.  4 mm nodule in the left lower lobe Patient was admitted to hospitalist service. See below for details.  Subjective: Patient was seen and examined this morning.   Propped up in bed.  Not in distress.  On low-flow oxygen by nasal cannula.  Hospital course: [place] associated pneumonia -Presented with shortness of breath -Met sepsis criteria with fever, tachycardia, pneumonia -Blood culture sent on admission grew staph capitis in 1 out of 4 bottles which probably is contamination. -Patient clinically improved with empiric treatment with IV Rocephin and IV azithromycin.  We will discharge her on 3 more days of oral Omnicef with probiotics. -Adequately  hydrated.  Chronic diastolic CHF Essential hypertension -Echo 8/2 showed EF 55 to 60% with grade 3 diastolic dysfunction -Home meds include amlodipine 10 mg daily, losartan 50 mg twice daily, metoprolol 25 mg twice daily -Currently on metoprolol 25 twice daily and Lasix 20 mg oral daily..  Amlodipine and losartan on hold.  Blood pressure stable.  At discharge, I would continue metoprolol and Lasix as needed for fluid.  I would not resume amlodipine and losartan.  Acute respiratory with hypoxia -Initially required BiPAP.  Gradually weaned down.  On 2 L oxygen nasal cannula this morning.  Continue oxygen at discharge with a plan to wean down at the facility.  New Right leg DVT -Right leg DVT suspected based on ultrasound duplex sent on admission.  Currently on full dose anticoagulation with Lovenox subcu. we will discharge on Eliquis.  First episode of DVT.  Plan for at least 3 to 6 months of anticoagulation.  Type 2 diabetes mellitus -A1c 5.5 in 8/11 controlled on metformin.  Continue the same. Recent Labs  Lab 09/21/20 1220 09/21/20 1644 09/21/20 1955 09/22/20 0725 09/22/20 1125  GLUCAP 141* 119* 120* 93 108*   History of CVA with residual deficits, bedbound status Hyperlipidemia -Previously on aspirin, statin.  Since he has been started on Eliquis for DVT.  I will stop aspirin.  Continue statin.  Seizure disorder -continue valproic acid   Hypothyrodism -Continue synthroid   Psych/do/vascular dementia -Continue home regimen including Klonopin, Prozac, risperidone   Allergies as of 09/22/2020       Reactions   Amoxicillin Rash   Tolerating rocephin 10/29/15   Ciprofloxacin Anaphylaxis  Clindamycin Hcl Swelling, Rash, Other (See Comments)   Other Reaction: BAD RASH AND SWELLING, SKIN BR Severe rash and swelling. Skin break down.   Penicillins Anaphylaxis   Tolerating rocephin 11/01/15   Codeine Nausea And Vomiting   Baclofen Itching, Rash   Contrast Media [iodinated  Diagnostic Agents] Itching, Rash   Erythromycin Rash   Iodides Rash   Povidone iodine; betadine   Latex Itching, Rash   Nickel Rash, Other (See Comments)   OTHER REACTION   Soap Itching, Rash   Sulfa Antibiotics Rash   Tape Rash   (silk tape only) skin break down   Tolterodine Rash   Vancomycin Rash        Medication List     STOP taking these medications    amLODipine 10 MG tablet Commonly known as: NORVASC   aspirin 81 MG chewable tablet   losartan 50 MG tablet Commonly known as: COZAAR       TAKE these medications    acidophilus Caps capsule Take 2 capsules by mouth 3 (three) times daily for 7 days.   apixaban 5 MG Tabs tablet Commonly known as: ELIQUIS Take 2 tablets (10 mg total) by mouth 2 (two) times daily.   apixaban 5 MG Tabs tablet Commonly known as: ELIQUIS Take 1 tablet (5 mg total) by mouth 2 (two) times daily. Start taking on: September 29, 2020   ARIPiprazole 5 MG tablet Commonly known as: ABILIFY Take 5 mg by mouth daily.   atorvastatin 40 MG tablet Commonly known as: LIPITOR Take 40 mg by mouth every morning.   cefdinir 300 MG capsule Commonly known as: OMNICEF Take 1 capsule (300 mg total) by mouth every 12 (twelve) hours for 3 days.   clonazePAM 0.5 MG tablet Commonly known as: KLONOPIN Take 0.5 mg by mouth 2 (two) times daily.   divalproex 125 MG capsule Commonly known as: DEPAKOTE SPRINKLE Take 500 mg by mouth 2 (two) times daily.   divalproex 125 MG capsule Commonly known as: DEPAKOTE SPRINKLE Take 750 mg by mouth at bedtime.   FLUoxetine 40 MG capsule Commonly known as: PROZAC Take 40 mg by mouth 2 (two) times daily.   furosemide 20 MG tablet Commonly known as: LASIX Take 1 tablet (20 mg total) by mouth daily as needed for fluid or edema.   levothyroxine 150 MCG tablet Commonly known as: SYNTHROID Take 150 mcg by mouth daily before breakfast.   metFORMIN 500 MG tablet Commonly known as: GLUCOPHAGE Take 500 mg by  mouth daily at 12 noon.   metoprolol tartrate 25 MG tablet Commonly known as: LOPRESSOR Take 25 mg by mouth 2 (two) times daily.   multivitamin with minerals Tabs tablet Take 1 tablet by mouth daily.   nitroGLYCERIN 0.4 MG SL tablet Commonly known as: NITROSTAT Place 0.4 mg under the tongue every 5 (five) minutes as needed for chest pain.   omeprazole 10 MG capsule Commonly known as: PRILOSEC Take 10 mg by mouth daily.   oxyCODONE-acetaminophen 5-325 MG tablet Commonly known as: PERCOCET/ROXICET Take 1 tablet by mouth 2 (two) times daily.   polyethylene glycol 17 g packet Commonly known as: MIRALAX / GLYCOLAX Take 17 g by mouth daily.   REFRESH P.M. OP Apply 1 inch to eye at bedtime as needed (dry eyes).   risperiDONE 0.5 MG tablet Commonly known as: RISPERDAL Take 0.5 mg by mouth at bedtime.   senna 8.6 MG tablet Commonly known as: SENOKOT Take 2 tablets by mouth 2 (two) times daily as  needed for constipation.               Discharge Care Instructions  (From admission, onward)           Start     Ordered   09/22/20 0000  Discharge wound care:       Comments: Frequent repositioning to avoid the injury from getting worse.  Dry dressing   09/22/20 1150            Discharge Instructions:  Diet Recommendation:  Discharge Diet Orders (From admission, onward)     Start     Ordered   09/22/20 0000  Diet general       Comments: Dysphagia 3 diet with thin liquids.   09/22/20 1150              Follow with Primary MD Kirk Ruths, MD in 7 days   Get CBC/BMP checked in next visit within 1 week by PCP or SNF MD ( we routinely change or add medications that can affect your baseline labs and fluid status, therefore we recommend that you get the mentioned basic workup next visit with your PCP, your PCP may decide not to get them or add new tests based on their clinical decision)  On your next visit with your PCP, please Get Medicines reviewed  and adjusted.  Please request your PCP  to go over all Hospital Tests and Procedure/Radiological results at the follow up, please get all Hospital records sent to your Prim MD by signing hospital release before you go home.  Activity: As tolerated with Full fall precautions use walker/cane & assistance as needed  For Heart failure patients - Check your Weight same time everyday, if you gain over 2 pounds, or you develop in leg swelling, experience more shortness of breath or chest pain, call your Primary MD immediately. Follow Cardiac Low Salt Diet and 1.5 lit/day fluid restriction.  If you have smoked or chewed Tobacco in the last 2 yrs please stop smoking, stop any regular Alcohol  and or any Recreational drug use.  If you experience worsening of your admission symptoms, develop shortness of breath, life threatening emergency, suicidal or homicidal thoughts you must seek medical attention immediately by calling 911 or calling your MD immediately  if symptoms less severe.  You Must read complete instructions/literature along with all the possible adverse reactions/side effects for all the Medicines you take and that have been prescribed to you. Take any new Medicines after you have completely understood and accpet all the possible adverse reactions/side effects.   Do not drive, operate heavy machinery, perform activities at heights, swimming or participation in water activities or provide baby sitting services if your were admitted for syncope or siezures until you have seen by Primary MD or a Neurologist and advised to do so again.  Do not drive when taking Pain medications.  Do not take more than prescribed Pain, Sleep and Anxiety Medications  Wear Seat belts while driving.   Please note You were cared for by a hospitalist during your hospital stay. If you have any questions about your discharge medications or the care you received while you were in the hospital after you are discharged, you  can call the unit and asked to speak with the hospitalist on call if the hospitalist that took care of you is not available. Once you are discharged, your primary care physician will handle any further medical issues. Please note that NO REFILLS for any discharge medications  will be authorized once you are discharged, as it is imperative that you return to your primary care physician (or establish a relationship with a primary care physician if you do not have one) for your aftercare needs so that they can reassess your need for medications and monitor your lab values.    Follow ups:    Follow-up Information     Kirk Ruths, MD Follow up.   Specialty: Internal Medicine Contact information: [place] [id number] [phone]                 Wound care:   Pressure Injury 09/18/20 Coccyx Medial;Lower Stage 2 -  Partial thickness loss of dermis presenting as a shallow open injury with a red, pink wound bed without slough. Skin not intact (Active)  Date First Assessed/Time First Assessed: 09/18/20 1049   Location: Coccyx  Location Orientation: Medial;Lower  Staging: Stage 2 -  Partial thickness loss of dermis presenting as a shallow open injury with a red, pink wound bed without slough.  Wound D...    Assessments 09/18/2020 10:50 AM 09/22/2020  8:00 AM  Dressing Type Silicone dressing Foam - Lift dressing to assess site every shift  Dressing Clean;Dry;Intact Changed  Site / Wound Assessment -- Clean;Dry  Drainage Amount -- Scant  Drainage Description -- Serosanguineous  Treatment -- Cleansed     No Linked orders to display    Discharge Exam:   Vitals:   09/22/20 0420 09/22/20 0725 09/22/20 1125 09/22/20 1453  BP: (!) 150/59 132/78 118/81 (!) 121/59  Pulse: 72 67 69 71  Resp: _0 Temp: 98.2 F (36.8 C) 98.4 F (36.9 C) 98.2 F (36.8 C) 97.8 F (36.6 C)  TempSrc: Oral Axillary  Oral  SpO2: 97% 94% 97% 97%   Weight:      Height:        Body mass index is 34.33 kg/m.  General exam: Pleasant, elderly Caucasian female.  Not in distress Skin: No rashes, lesions or ulcers. HEENT: Atraumatic, normocephalic, no obvious bleeding Lungs: Clear to auscultation bilaterally CVS: Regular rate and rhythm, no murmur GI/Abd soft, nontender, nondistended, bowel sound present CNS: Alert, awake, slow to answer but oriented to place and person.  Left upper extremity contracture noted Psychiatry: Mood appropriate Extremities: No pedal edema, no calf tenderness  Time coordinating discharge: 35 minutes   The results of significant diagnostics from this hospitalization (including imaging, microbiology, ancillary and laboratory) are listed below for reference.    Procedures and Diagnostic Studies:   ECHOCARDIOGRAM COMPLETE  Result Date: 09/19/2020    ECHOCARDIOGRAM REPORT   Patient Name:   Tammy Shepard Date of Exam: 09/19/2020 Medical Rec #:  [id number]        Height:       64.0 in Accession #:    [id number]       Weight:       200.0 lb Date of Birth:  06/03/1950       BSA:          1.956 m Patient Age:    28 years         BP:           141/69 mmHg Patient Gender: F                HR:           94 bpm. Exam Location:  [facility] Procedure: 2D Echo, Color Doppler,  Cardiac Doppler and Intracardiac            Opacification Agent Indications:     R07.9 Chest Pain  History:         Patient has no prior history of Echocardiogram examinations.                  Stroke; Risk Factors:Hypertension, Diabetes and Dyslipidemia.                  Asthma.  Sonographer:     Charmayne Sheer Referring Phys:  [id number] SARA-MAIZ A THOMAS Diagnosing Phys: Yolonda Kida MD  Sonographer Comments: Technically difficult study due to poor echo windows. Image acquisition challenging due to patient body habitus. IMPRESSIONS  1. Left ventricular ejection fraction, by estimation, is 55 to 60%. The left ventricle has normal function. The left ventricle has  no regional wall motion abnormalities. Left ventricular diastolic parameters are consistent with Grade III diastolic dysfunction (restrictive).  2. Right ventricular systolic function is normal. The right ventricular size is normal.  3. The mitral valve is normal in structure. No evidence of mitral valve regurgitation.  4. The aortic valve is normal in structure. Aortic valve regurgitation is not visualized. Conclusion(s)/Recommendation(s): Normal biventricular function without evidence of hemodynamically significant valvular heart disease. FINDINGS  Left Ventricle: Left ventricular ejection fraction, by estimation, is 55 to 60%. The left ventricle has normal function. The left ventricle has no regional wall motion abnormalities. Definity contrast agent was given IV to delineate the left ventricular  endocardial borders. The left ventricular internal cavity size was normal in size. There is no left ventricular hypertrophy. Left ventricular diastolic parameters are consistent with Grade III diastolic dysfunction (restrictive). Right Ventricle: The right ventricular size is normal. No increase in right ventricular wall thickness. Right ventricular systolic function is normal. Left Atrium: Left atrial size was normal in size. Right Atrium: Right atrial size was normal in size. Pericardium: There is no evidence of pericardial effusion. Mitral Valve: The mitral valve is normal in structure. No evidence of mitral valve regurgitation. MV peak gradient, 3.5 mmHg. The mean mitral valve gradient is 2.0 mmHg. Tricuspid Valve: The tricuspid valve is normal in structure. Tricuspid valve regurgitation is not demonstrated. Aortic Valve: The aortic valve is normal in structure. Aortic valve regurgitation is not visualized. Aortic valve mean gradient measures 5.0 mmHg. Aortic valve peak gradient measures 9.1 mmHg. Aortic valve area, by VTI measures 1.49 cm. Pulmonic Valve: The pulmonic valve was normal in structure. Pulmonic valve  regurgitation is not visualized. Aorta: The ascending aorta was not well visualized. IAS/Shunts: No atrial level shunt detected by color flow Doppler.  LEFT VENTRICLE PLAX 2D LVIDd:         4.92 cm  Diastology LVIDs:         3.45 cm  LV e' medial:    5.00 cm/s LV PW:         0.98 cm  LV E/e' medial:  12.0 LV IVS:        0.70 cm  LV e' lateral:   7.40 cm/s LVOT diam:     1.70 cm  LV E/e' lateral: 8.1 LV SV:         40 LV SV Index:   21 LVOT Area:     2.27 cm  LEFT ATRIUM             Index LA diam:        2.80 cm 1.43 cm/m LA Vol (A2C):  36.9 ml 18.86 ml/m LA Vol (A4C):   34.6 ml 17.69 ml/m LA Biplane Vol: 38.3 ml 19.58 ml/m  AORTIC VALVE                    PULMONIC VALVE AV Area (Vmax):    1.43 cm     PV Vmax:       1.13 m/s AV Area (Vmean):   1.30 cm     PV Vmean:      75.400 cm/s AV Area (VTI):     1.49 cm     PV VTI:        0.161 m AV Vmax:           151.00 cm/s  PV Peak grad:  5.1 mmHg AV Vmean:          111.000 cm/s PV Mean grad:  3.0 mmHg AV VTI:            0.269 m AV Peak Grad:      9.1 mmHg AV Mean Grad:      5.0 mmHg LVOT Vmax:         95.30 cm/s LVOT Vmean:        63.400 cm/s LVOT VTI:          0.177 m LVOT/AV VTI ratio: 0.66  AORTA Ao Root diam: 3.10 cm MITRAL VALVE MV Area (PHT): 9.73 cm    SHUNTS MV Area VTI:   2.39 cm    Systemic VTI:  0.18 m MV Peak grad:  3.5 mmHg    Systemic Diam: 1.70 cm MV Mean grad:  2.0 mmHg MV Vmax:       0.93 m/s MV Vmean:      59.9 cm/s MV Decel Time: 78 msec MV E velocity: 60.00 cm/s MV A velocity: 93.00 cm/s MV E/A ratio:  0.65 Dwayne D Callwood MD Electronically signed by Yolonda Kida MD Signature Date/Time: 09/19/2020/6:11:31 PM    Final      Labs:   Basic Metabolic Panel: Recent Labs  Lab 09/18/20 0922 09/19/20 0604 09/21/20 0613  NA 137 143 142  K 4.1 3.0* 3.8  CL 104 112* 108  CO2 _0 GLUCOSE 129* 88 97  BUN _1 CREATININE 0.85 0.82 0.76  CALCIUM 8.8* 7.7* 8.7*  MG  --   --  1.8   GFR Estimated Creatinine Clearance: 73.4  mL/min (by C-G formula based on SCr of 0.76 mg/dL). Liver Function Tests: Recent Labs  Lab 09/18/20 0922 09/19/20 0604  AST 17 8*  ALT 12 8  ALKPHOS 56 49  BILITOT 0.6 1.0  PROT 7.1 5.8*  ALBUMIN 3.5 3.0*   No results for input(s): LIPASE, AMYLASE in the last 168 hours. No results for input(s): AMMONIA in the last 168 hours. Coagulation profile No results for input(s): INR, PROTIME in the last 168 hours.  CBC: Recent Labs  Lab 09/18/20 0922 09/19/20 0604 09/22/20 0520  WBC 9.5 11.7* 7.8  NEUTROABS  --  7.8*  --   HGB 14.7 13.7 12.3  HCT 42.3 40.2 36.3  MCV 99.8 100.0 99.7  PLT 177 168 159   Cardiac Enzymes: No results for input(s): CKTOTAL, CKMB, CKMBINDEX, TROPONINI in the last 168 hours. BNP: Invalid input(s): POCBNP CBG: Recent Labs  Lab 09/21/20 1220 09/21/20 1644 09/21/20 1955 09/22/20 0725 09/22/20 1125  GLUCAP 141* 119* 120* 93 108*   D-Dimer No results for input(s): DDIMER in the last 72 hours. Hgb A1c No results  for input(s): HGBA1C in the last 72 hours. Lipid Profile No results for input(s): CHOL, HDL, LDLCALC, TRIG, CHOLHDL, LDLDIRECT in the last 72 hours. Thyroid function studies No results for input(s): TSH, T4TOTAL, T3FREE, THYROIDAB in the last 72 hours.  Invalid input(s): FREET3 Anemia work up No results for input(s): VITAMINB12, FOLATE, FERRITIN, TIBC, IRON, RETICCTPCT in the last 72 hours. Microbiology Recent Results (from the past 240 hour(s))  Culture, blood (routine x 2) Call MD if unable to obtain prior to antibiotics being given     Status: Abnormal (Preliminary result)   Collection Time: 09/18/20  9:25 AM   Specimen: BLOOD  Result Value Ref Range Status   Specimen Description   Final    BLOOD BLOOD RIGHT HAND Performed at [facility], [address] [place], [place] [address]    Special Requests   Final    BOTTLES DRAWN AEROBIC AND ANAEROBIC Blood Culture adequate volume Performed at [facility], [address] [place], [place] [address]    Culture  Setup Time   Final    CRITICAL VALUE NOTED.  VALUE IS CONSISTENT WITH PREVIOUSLY REPORTED AND CALLED VALUE. Performed at [facility], [place] [address], [place], [place] [address]    Culture STAPHYLOCOCCUS CAPITIS (A)  Final   Report Status PENDING  Incomplete  Culture, blood (routine x 2) Call MD if unable to obtain prior to antibiotics being given     Status: None (Preliminary result)   Collection Time: 09/18/20  9:25 AM   Specimen: BLOOD  Result Value Ref Range Status   Specimen Description   Final    BLOOD RIGHT ANTECUBITAL Performed at [facility], [address] [place], [place] [address]    Special Requests   Final    BOTTLES DRAWN AEROBIC AND ANAEROBIC Blood Culture results may not be optimal due to an excessive volume of blood received in culture bottles Performed at [facility], [address] [place], [place] [address]    Culture  Setup Time   Final    IN BOTH AEROBIC AND ANAEROBIC BOTTLES GRAM POSITIVE COCCI CRITICAL RESULT CALLED TO, READ BACK BY AND VERIFIED WITH: [place], PHARMD AT [place] ON 09/19/20 BY QSD GRAM STAIN REVIEWED-AGREE WITH RESULT BY TW  CRITICAL VALUE NOTED.  VALUE IS CONSISTENT WITH PREVIOUSLY REPORTED AND CALLED VALUE.    Culture   Final    GRAM POSITIVE COCCI IDENTIFICATION TO FOLLOW Performed at [facility], [place] [address] [place], [place] [address]    Report Status PENDING  Incomplete  Blood Culture ID Panel (Reflexed)     Status: Abnormal   Collection Time: 09/18/20  9:25 AM  Result Value Ref Range Status   Enterococcus faecalis NOT DETECTED NOT DETECTED Final   Enterococcus Faecium NOT DETECTED NOT DETECTED Final   Listeria monocytogenes NOT DETECTED NOT DETECTED Final   Staphylococcus species DETECTED (A) NOT DETECTED Final    Comment: CRITICAL RESULT CALLED TO, READ BACK BY AND VERIFIED WITH: LISA KLUTZ, PHARMD AT 1129 ON 09/19/20 BY QSD     Staphylococcus aureus (BCID) NOT DETECTED NOT DETECTED Final   Staphylococcus epidermidis NOT DETECTED NOT DETECTED Final   Staphylococcus lugdunensis NOT DETECTED NOT DETECTED Final   Streptococcus species NOT DETECTED NOT DETECTED Final   Streptococcus agalactiae NOT DETECTED NOT DETECTED Final   Streptococcus pneumoniae NOT DETECTED NOT DETECTED Final   Streptococcus pyogenes NOT DETECTED NOT DETECTED Final   A.calcoaceticus-baumannii NOT DETECTED NOT DETECTED Final  Bacteroides fragilis NOT DETECTED NOT DETECTED Final   Enterobacterales NOT DETECTED NOT DETECTED Final   Enterobacter cloacae complex NOT DETECTED NOT DETECTED Final   Escherichia coli NOT DETECTED NOT DETECTED Final   Klebsiella aerogenes NOT DETECTED NOT DETECTED Final   Klebsiella oxytoca NOT DETECTED NOT DETECTED Final   Klebsiella pneumoniae NOT DETECTED NOT DETECTED Final   Proteus species NOT DETECTED NOT DETECTED Final   Salmonella species NOT DETECTED NOT DETECTED Final   Serratia marcescens NOT DETECTED NOT DETECTED Final   Haemophilus influenzae NOT DETECTED NOT DETECTED Final   Neisseria meningitidis NOT DETECTED NOT DETECTED Final   Pseudomonas aeruginosa NOT DETECTED NOT DETECTED Final   Stenotrophomonas maltophilia NOT DETECTED NOT DETECTED Final   Candida albicans NOT DETECTED NOT DETECTED Final   Candida auris NOT DETECTED NOT DETECTED Final   Candida glabrata NOT DETECTED NOT DETECTED Final   Candida krusei NOT DETECTED NOT DETECTED Final   Candida parapsilosis NOT DETECTED NOT DETECTED Final   Candida tropicalis NOT DETECTED NOT DETECTED Final   Cryptococcus neoformans/gattii NOT DETECTED NOT DETECTED Final    Comment: Performed at [facility], [address] [place], [place] [address]  Resp Panel by RT-PCR (Flu A&B, Covid) Nasopharyngeal Swab     Status: None   Collection Time: 09/18/20 10:40 AM   Specimen: Nasopharyngeal Swab; Nasopharyngeal(NP) swabs in vial transport medium  Result  Value Ref Range Status   SARS Coronavirus 2 by RT PCR NEGATIVE NEGATIVE Final    Comment: (NOTE) SARS-CoV-2 target nucleic acids are NOT DETECTED.  The SARS-CoV-2 RNA is generally detectable in upper respiratory specimens during the acute phase of infection. The lowest concentration of SARS-CoV-2 viral copies this assay can detect is 138 copies/mL. A negative result does not preclude SARS-Cov-2 infection and should not be used as the sole basis for treatment or other patient management decisions. A negative result may occur with  improper specimen collection/handling, submission of specimen other than nasopharyngeal swab, presence of viral mutation(s) within the areas targeted by this assay, and inadequate number of viral copies(<138 copies/mL). A negative result must be combined with clinical observations, patient history, and epidemiological information. The expected result is Negative.  Fact Sheet for Patients:  EntrepreneurPulse.com.au  Fact Sheet for Healthcare Providers:  IncredibleEmployment.be  This test is no t yet approved or cleared by the [place] FDA and  has been authorized for detection and/or diagnosis of SARS-CoV-2 by FDA under an Emergency Use Authorization (EUA). This EUA will remain  in effect (meaning this test can be used) for the duration of the COVID-19 declaration under Section 564(b)(1) of the Act, 21 U.S.C.section 360bbb-3(b)(1), unless the authorization is terminated  or revoked sooner.       Influenza A by PCR NEGATIVE NEGATIVE Final   Influenza B by PCR NEGATIVE NEGATIVE Final    Comment: (NOTE) The Xpert Xpress SARS-CoV-2/FLU/RSV plus assay is intended as an aid in the diagnosis of influenza from Nasopharyngeal swab specimens and should not be used as a sole basis for treatment. Nasal washings and aspirates are unacceptable for Xpert Xpress SARS-CoV-2/FLU/RSV testing.  Fact Sheet for  Patients: EntrepreneurPulse.com.au  Fact Sheet for Healthcare Providers: IncredibleEmployment.be  This test is not yet approved or cleared by the [place] FDA and has been authorized for detection and/or diagnosis of SARS-CoV-2 by FDA under an Emergency Use Authorization (EUA). This EUA will remain in effect (meaning this test can be used) for the duration of the COVID-19 declaration under Section 564(b)(1)  of the Act, 21 U.S.C. section 360bbb-3(b)(1), unless the authorization is terminated or revoked.  Performed at [facility], [address] [place], [place] [address]   MRSA Next Gen by PCR, Nasal     Status: None   Collection Time: 09/19/20  5:45 PM   Specimen: Nasal Mucosa; Nasal Swab  Result Value Ref Range Status   MRSA by PCR Next Gen NOT DETECTED NOT DETECTED Final    Comment: (NOTE) The GeneXpert MRSA Assay (FDA approved for NASAL specimens only), is one component of a comprehensive MRSA colonization surveillance program. It is not intended to diagnose MRSA infection nor to guide or monitor treatment for MRSA infections. Test performance is not FDA approved in patients less than 64 years old. Performed at [facility], [place], [place] [address]   Resp Panel by RT-PCR (Flu A&B, Covid) Nasopharyngeal Swab     Status: None   Collection Time: 09/22/20  1:55 PM   Specimen: Nasopharyngeal Swab; Nasopharyngeal(NP) swabs in vial transport medium  Result Value Ref Range Status   SARS Coronavirus 2 by RT PCR NEGATIVE NEGATIVE Final    Comment: (NOTE) SARS-CoV-2 target nucleic acids are NOT DETECTED.  The SARS-CoV-2 RNA is generally detectable in upper respiratory specimens during the acute phase of infection. The lowest concentration of SARS-CoV-2 viral copies this assay can detect is 138 copies/mL. A negative result does not preclude SARS-Cov-2 infection and should not be used as the sole basis  for treatment or other patient management decisions. A negative result may occur with  improper specimen collection/handling, submission of specimen other than nasopharyngeal swab, presence of viral mutation(s) within the areas targeted by this assay, and inadequate number of viral copies(<138 copies/mL). A negative result must be combined with clinical observations, patient history, and epidemiological information. The expected result is Negative.  Fact Sheet for Patients:  EntrepreneurPulse.com.au  Fact Sheet for Healthcare Providers:  IncredibleEmployment.be  This test is no t yet approved or cleared by the [place] FDA and  has been authorized for detection and/or diagnosis of SARS-CoV-2 by FDA under an Emergency Use Authorization (EUA). This EUA will remain  in effect (meaning this test can be used) for the duration of the COVID-19 declaration under Section 564(b)(1) of the Act, 21 U.S.C.section 360bbb-3(b)(1), unless the authorization is terminated  or revoked sooner.       Influenza A by PCR NEGATIVE NEGATIVE Final   Influenza B by PCR NEGATIVE NEGATIVE Final    Comment: (NOTE) The Xpert Xpress SARS-CoV-2/FLU/RSV plus assay is intended as an aid in the diagnosis of influenza from Nasopharyngeal swab specimens and should not be used as a sole basis for treatment. Nasal washings and aspirates are unacceptable for Xpert Xpress SARS-CoV-2/FLU/RSV testing.  Fact Sheet for Patients: EntrepreneurPulse.com.au  Fact Sheet for Healthcare Providers: IncredibleEmployment.be  This test is not yet approved or cleared by the [place] FDA and has been authorized for detection and/or diagnosis of SARS-CoV-2 by FDA under an Emergency Use Authorization (EUA). This EUA will remain in effect (meaning this test can be used) for the duration of the COVID-19 declaration under Section 564(b)(1) of the Act, 21  U.S.C. section 360bbb-3(b)(1), unless the authorization is terminated or revoked.  Performed at [facility], [address] [place], [place] [address]      Signed: Terrilee Croak  [facility] 09/22/2020, 3:04 PM

## 2020-09-22 NOTE — Progress Notes (Signed)
Report called to Tobi Bastos, LPN at [facility]. Waiting on transport.

## 2020-09-26 LAB — CULTURE, BLOOD (ROUTINE X 2): Special Requests: ADEQUATE

## 2020-09-29 LAB — BACTERIAL ORGANISM REFLEX

## 2020-09-29 LAB — ORGANISM ID, BACTERIA: Source of Sample: 8664

## 2020-11-10 LAB — CULTURE, BLOOD (ROUTINE X 2)

## 2020-11-10 LAB — AEROBIC BACTERIA, ID BY SEQ.

## 2021-02-14 ENCOUNTER — Inpatient Hospital Stay
Admission: EM | Admit: 2021-02-14 | Discharge: 2021-02-18 | DRG: 189 | Disposition: A | Discharge status: Skilled Nursing Facility | Payer: Medicare (Managed Care) | Source: Skilled Nursing Facility | Attending: Internal Medicine | Admitting: Internal Medicine

## 2021-02-14 ENCOUNTER — Emergency Department: Payer: Medicare (Managed Care)

## 2021-02-14 DIAGNOSIS — Z91041 Radiographic dye allergy status: Secondary | ICD-10-CM

## 2021-02-14 DIAGNOSIS — Z87891 Personal history of nicotine dependence: Secondary | ICD-10-CM | POA: Diagnosis not present

## 2021-02-14 DIAGNOSIS — Z86711 Personal history of pulmonary embolism: Secondary | ICD-10-CM | POA: Diagnosis not present

## 2021-02-14 DIAGNOSIS — Z20822 Contact with and (suspected) exposure to covid-19: Secondary | ICD-10-CM | POA: Diagnosis present

## 2021-02-14 DIAGNOSIS — Z91040 Latex allergy status: Secondary | ICD-10-CM | POA: Diagnosis not present

## 2021-02-14 DIAGNOSIS — Z66 Do not resuscitate: Secondary | ICD-10-CM | POA: Diagnosis present

## 2021-02-14 DIAGNOSIS — Z79899 Other long term (current) drug therapy: Secondary | ICD-10-CM

## 2021-02-14 DIAGNOSIS — F0150 Vascular dementia without behavioral disturbance: Secondary | ICD-10-CM | POA: Diagnosis present

## 2021-02-14 DIAGNOSIS — I69320 Aphasia following cerebral infarction: Secondary | ICD-10-CM

## 2021-02-14 DIAGNOSIS — I10 Essential (primary) hypertension: Secondary | ICD-10-CM | POA: Diagnosis present

## 2021-02-14 DIAGNOSIS — R0603 Acute respiratory distress: Secondary | ICD-10-CM | POA: Diagnosis present

## 2021-02-14 DIAGNOSIS — E785 Hyperlipidemia, unspecified: Secondary | ICD-10-CM | POA: Diagnosis present

## 2021-02-14 DIAGNOSIS — G40909 Epilepsy, unspecified, not intractable, without status epilepticus: Secondary | ICD-10-CM | POA: Diagnosis present

## 2021-02-14 DIAGNOSIS — L89153 Pressure ulcer of sacral region, stage 3: Secondary | ICD-10-CM | POA: Diagnosis present

## 2021-02-14 DIAGNOSIS — Z8616 Personal history of COVID-19: Secondary | ICD-10-CM

## 2021-02-14 DIAGNOSIS — L98429 Non-pressure chronic ulcer of back with unspecified severity: Secondary | ICD-10-CM | POA: Diagnosis present

## 2021-02-14 DIAGNOSIS — Z888 Allergy status to other drugs, medicaments and biological substances status: Secondary | ICD-10-CM

## 2021-02-14 DIAGNOSIS — Z9981 Dependence on supplemental oxygen: Secondary | ICD-10-CM

## 2021-02-14 DIAGNOSIS — Z91048 Other nonmedicinal substance allergy status: Secondary | ICD-10-CM

## 2021-02-14 DIAGNOSIS — R569 Unspecified convulsions: Secondary | ICD-10-CM | POA: Diagnosis not present

## 2021-02-14 DIAGNOSIS — E039 Hypothyroidism, unspecified: Secondary | ICD-10-CM | POA: Diagnosis present

## 2021-02-14 DIAGNOSIS — R0602 Shortness of breath: Secondary | ICD-10-CM

## 2021-02-14 DIAGNOSIS — Z86718 Personal history of other venous thrombosis and embolism: Secondary | ICD-10-CM

## 2021-02-14 DIAGNOSIS — I248 Other forms of acute ischemic heart disease: Secondary | ICD-10-CM | POA: Diagnosis present

## 2021-02-14 DIAGNOSIS — E118 Type 2 diabetes mellitus with unspecified complications: Secondary | ICD-10-CM | POA: Diagnosis not present

## 2021-02-14 DIAGNOSIS — Z8673 Personal history of transient ischemic attack (TIA), and cerebral infarction without residual deficits: Secondary | ICD-10-CM

## 2021-02-14 DIAGNOSIS — I82409 Acute embolism and thrombosis of unspecified deep veins of unspecified lower extremity: Secondary | ICD-10-CM | POA: Diagnosis present

## 2021-02-14 DIAGNOSIS — J40 Bronchitis, not specified as acute or chronic: Secondary | ICD-10-CM | POA: Diagnosis present

## 2021-02-14 DIAGNOSIS — N390 Urinary tract infection, site not specified: Secondary | ICD-10-CM | POA: Diagnosis present

## 2021-02-14 DIAGNOSIS — I69354 Hemiplegia and hemiparesis following cerebral infarction affecting left non-dominant side: Secondary | ICD-10-CM | POA: Diagnosis not present

## 2021-02-14 DIAGNOSIS — Z79891 Long term (current) use of opiate analgesic: Secondary | ICD-10-CM

## 2021-02-14 DIAGNOSIS — Z880 Allergy status to penicillin: Secondary | ICD-10-CM

## 2021-02-14 DIAGNOSIS — E119 Type 2 diabetes mellitus without complications: Secondary | ICD-10-CM | POA: Diagnosis present

## 2021-02-14 DIAGNOSIS — Z881 Allergy status to other antibiotic agents status: Secondary | ICD-10-CM

## 2021-02-14 DIAGNOSIS — Z7901 Long term (current) use of anticoagulants: Secondary | ICD-10-CM | POA: Diagnosis not present

## 2021-02-14 DIAGNOSIS — Z981 Arthrodesis status: Secondary | ICD-10-CM | POA: Diagnosis not present

## 2021-02-14 DIAGNOSIS — Z885 Allergy status to narcotic agent status: Secondary | ICD-10-CM

## 2021-02-14 DIAGNOSIS — G9341 Metabolic encephalopathy: Secondary | ICD-10-CM | POA: Diagnosis present

## 2021-02-14 DIAGNOSIS — J9621 Acute and chronic respiratory failure with hypoxia: Principal | ICD-10-CM | POA: Diagnosis present

## 2021-02-14 DIAGNOSIS — Z79890 Hormone replacement therapy (postmenopausal): Secondary | ICD-10-CM

## 2021-02-14 DIAGNOSIS — Z882 Allergy status to sulfonamides status: Secondary | ICD-10-CM

## 2021-02-14 LAB — CBC WITH DIFFERENTIAL/PLATELET
Abs Immature Granulocytes: 0.06 10*3/uL (ref 0.00–0.07)
Basophils Absolute: 0 10*3/uL (ref 0.0–0.1)
Basophils Relative: 0 %
Eosinophils Absolute: 0.1 10*3/uL (ref 0.0–0.5)
Eosinophils Relative: 1 %
HCT: 46.6 % — ABNORMAL HIGH (ref 36.0–46.0)
Hemoglobin: 15 g/dL (ref 12.0–15.0)
Immature Granulocytes: 1 %
Lymphocytes Relative: 23 %
Lymphs Abs: 1.9 10*3/uL (ref 0.7–4.0)
MCH: 31.8 pg (ref 26.0–34.0)
MCHC: 32.2 g/dL (ref 30.0–36.0)
MCV: 98.9 fL (ref 80.0–100.0)
Monocytes Absolute: 1.3 10*3/uL — ABNORMAL HIGH (ref 0.1–1.0)
Monocytes Relative: 15 %
Neutro Abs: 4.8 10*3/uL (ref 1.7–7.7)
Neutrophils Relative %: 60 %
Platelets: 174 10*3/uL (ref 150–400)
RBC: 4.71 MIL/uL (ref 3.87–5.11)
RDW: 13.8 % (ref 11.5–15.5)
WBC: 8.2 10*3/uL (ref 4.0–10.5)
nRBC: 0 % (ref 0.0–0.2)

## 2021-02-14 LAB — RESP PANEL BY RT-PCR (FLU A&B, COVID) ARPGX2
Influenza A by PCR: NEGATIVE
Influenza B by PCR: NEGATIVE
SARS Coronavirus 2 by RT PCR: NEGATIVE

## 2021-02-14 LAB — COMPREHENSIVE METABOLIC PANEL
ALT: 13 U/L (ref 0–44)
AST: 17 U/L (ref 15–41)
Albumin: 3.2 g/dL — ABNORMAL LOW (ref 3.5–5.0)
Alkaline Phosphatase: 54 U/L (ref 38–126)
Anion gap: 10 (ref 5–15)
BUN: 22 mg/dL (ref 8–23)
CO2: 23 mmol/L (ref 22–32)
Calcium: 8.8 mg/dL — ABNORMAL LOW (ref 8.9–10.3)
Chloride: 107 mmol/L (ref 98–111)
Creatinine, Ser: 0.73 mg/dL (ref 0.44–1.00)
GFR, Estimated: >60 mL/min (ref >60)
Glucose, Bld: 143 mg/dL — ABNORMAL HIGH (ref 70–99)
Potassium: 3.9 mmol/L (ref 3.5–5.1)
Sodium: 140 mmol/L (ref 135–145)
Total Bilirubin: 0.6 mg/dL (ref 0.3–1.2)
Total Protein: 7.1 g/dL (ref 6.5–8.1)

## 2021-02-14 LAB — BLOOD GAS, VENOUS
Acid-base deficit: 0.5 mmol/L (ref 0.0–2.0)
Bicarbonate: 25.9 mmol/L (ref 20.0–28.0)
O2 Saturation: 95.9 %
Patient temperature: 37
pCO2, Ven: 48 mmHg (ref 44.0–60.0)
pH, Ven: 7.34 (ref 7.250–7.430)
pO2, Ven: 86 mmHg — ABNORMAL HIGH (ref 32.0–45.0)

## 2021-02-14 LAB — PROTIME-INR
INR: 1.1 (ref 0.8–1.2)
Prothrombin Time: 14.5 seconds (ref 11.4–15.2)

## 2021-02-14 LAB — PROCALCITONIN: Procalcitonin: <0.1 ng/mL

## 2021-02-14 LAB — LACTIC ACID, PLASMA
Lactic Acid, Venous: 2 mmol/L (ref 0.5–1.9)
Lactic Acid, Venous: 2.2 mmol/L (ref 0.5–1.9)

## 2021-02-14 LAB — APTT: aPTT: 45 seconds — ABNORMAL HIGH (ref 24–36)

## 2021-02-14 LAB — TROPONIN I (HIGH SENSITIVITY)
Troponin I (High Sensitivity): 23 ng/L — ABNORMAL HIGH (ref <18)
Troponin I (High Sensitivity): 185 ng/L (ref <18)

## 2021-02-14 LAB — BRAIN NATRIURETIC PEPTIDE: B Natriuretic Peptide: 61.9 pg/mL (ref 0.0–100.0)

## 2021-02-14 MED ORDER — ATORVASTATIN CALCIUM 20 MG PO TABS
40.0000 mg | ORAL_TABLET | ORAL | Status: DC
  Administered 2021-02-16 – 2021-02-18 (×3): 40 mg via ORAL
  Filled 2021-02-14 (×4): qty 2

## 2021-02-14 MED ORDER — ASPIRIN 81 MG PO CHEW
324.0000 mg | CHEWABLE_TABLET | Freq: Once | ORAL | Status: AC
  Administered 2021-02-14: 324 mg via ORAL
  Filled 2021-02-14: qty 4

## 2021-02-14 MED ORDER — APIXABAN 5 MG PO TABS
5.0000 mg | ORAL_TABLET | Freq: Two times a day (BID) | ORAL | Status: DC
  Administered 2021-02-15 – 2021-02-18 (×7): 5 mg via ORAL
  Filled 2021-02-14 (×7): qty 1

## 2021-02-14 MED ORDER — ARIPIPRAZOLE 5 MG PO TABS
5.0000 mg | ORAL_TABLET | Freq: Every day | ORAL | Status: DC
  Administered 2021-02-15 – 2021-02-18 (×4): 5 mg via ORAL
  Filled 2021-02-14 (×4): qty 1

## 2021-02-14 MED ORDER — SODIUM CHLORIDE 0.9% FLUSH: 3.0000 mL | INTRAVENOUS | Status: DC | PRN

## 2021-02-14 MED ORDER — ACETAMINOPHEN 325 MG PO TABS
650.0000 mg | ORAL_TABLET | Freq: Four times a day (QID) | ORAL | Status: DC | PRN
  Administered 2021-02-15 – 2021-02-17 (×3): 650 mg via ORAL
  Filled 2021-02-14 (×3): qty 2

## 2021-02-14 MED ORDER — FLUOXETINE HCL 20 MG PO CAPS
40.0000 mg | ORAL_CAPSULE | Freq: Every day | ORAL | Status: DC
  Administered 2021-02-15 – 2021-02-18 (×4): 40 mg via ORAL
  Filled 2021-02-14 (×4): qty 2

## 2021-02-14 MED ORDER — ACETAMINOPHEN 650 MG RE SUPP: 650.0000 mg | Freq: Four times a day (QID) | RECTAL | Status: DC | PRN

## 2021-02-14 MED ORDER — METOPROLOL TARTRATE 25 MG PO TABS
25.0000 mg | ORAL_TABLET | Freq: Two times a day (BID) | ORAL | Status: DC
  Administered 2021-02-15 – 2021-02-18 (×7): 25 mg via ORAL
  Filled 2021-02-14 (×7): qty 1

## 2021-02-14 MED ORDER — SODIUM CHLORIDE 0.9% FLUSH
3.0000 mL | Freq: Two times a day (BID) | INTRAVENOUS | Status: DC
  Administered 2021-02-14 – 2021-02-18 (×8): 3 mL via INTRAVENOUS

## 2021-02-14 MED ORDER — DIVALPROEX SODIUM 125 MG PO CSDR
500.0000 mg | DELAYED_RELEASE_CAPSULE | Freq: Two times a day (BID) | ORAL | Status: DC
  Administered 2021-02-15 – 2021-02-18 (×7): 500 mg via ORAL
  Filled 2021-02-14 (×9): qty 4

## 2021-02-14 MED ORDER — IPRATROPIUM-ALBUTEROL 0.5-2.5 (3) MG/3ML IN SOLN
3.0000 mL | Freq: Once | RESPIRATORY_TRACT | Status: AC
  Administered 2021-02-14: 3 mL via RESPIRATORY_TRACT
  Filled 2021-02-14: qty 3

## 2021-02-14 MED ORDER — OXYCODONE-ACETAMINOPHEN 5-325 MG PO TABS
1.0000 | ORAL_TABLET | Freq: Three times a day (TID) | ORAL | Status: DC
  Administered 2021-02-15 – 2021-02-18 (×10): 1 via ORAL
  Filled 2021-02-14 (×10): qty 1

## 2021-02-14 MED ORDER — LEVOTHYROXINE SODIUM 50 MCG PO TABS
150.0000 ug | ORAL_TABLET | Freq: Every day | ORAL | Status: DC
  Administered 2021-02-15 – 2021-02-18 (×4): 150 ug via ORAL
  Filled 2021-02-14: qty 3
  Filled 2021-02-14 (×3): qty 1

## 2021-02-14 MED ORDER — CLONAZEPAM 0.5 MG PO TABS
0.5000 mg | ORAL_TABLET | Freq: Two times a day (BID) | ORAL | Status: DC
  Administered 2021-02-15 – 2021-02-18 (×7): 0.5 mg via ORAL
  Filled 2021-02-14 (×7): qty 1

## 2021-02-14 MED ORDER — SODIUM CHLORIDE 0.9 % IV SOLN: 250.0000 mL | INTRAVENOUS | Status: DC | PRN

## 2021-02-14 MED ORDER — RISPERIDONE 0.5 MG PO TABS
0.5000 mg | ORAL_TABLET | Freq: Every day | ORAL | Status: DC
  Administered 2021-02-15 – 2021-02-17 (×3): 0.5 mg via ORAL
  Filled 2021-02-14 (×4): qty 1

## 2021-02-14 NOTE — ED Notes (Signed)
MD Roxan Hockey verbalized to place this pt on baseline 4L [place] to trial oxygenation.

## 2021-02-14 NOTE — ED Notes (Signed)
MD made aware of Lactic of 2.2

## 2021-02-14 NOTE — ED Provider Notes (Signed)
Patient received in signout from Dr. Katrinka Blazing pending observation.  Patient weaned down to 4 L nasal cannula.  I discussed the case with the patient's nurse practitioner at her facility who states there was no intent to send her to the ER and that was a miscommunication with EMS that she frequently has these episodes.  Her work-up here is reassuring.  Has a mild lactate bump and mild troponin elevation but she denies any shortness of breath or chest pain.  We will observe her on telemetry and pulse oximetry will repeat troponin to make sure there is no significant elevation but I have a much lower suspicion for ACS.  She is on Eliquis does not seem consistent with PE chest x-ray does not show any sign of pneumonitis or pneumonia.  Her procalcitonin is negative.   Repeat troponin did significantly elevated.  She is denying any chest pain or pressure right now no hypoxia.  Have a lower suspicion for aspiration.  Possible dysrhythmia.  Based on elevation of her troponin I do believe that she will require hospitalization.   Willy Eddy, MD 02/14/21 1800

## 2021-02-14 NOTE — H&P (Signed)
History and Physical    Tammy Shepard JYN:[id number] DOB: Sep 04, 1950 DOA: 02/14/2021  PCP: Lauro Regulus, MD    Patient coming from:  [facility].    Chief Complaint:  Respiratory distress.   HPI:  Tammy Shepard is a 71 y.o. female seen in ed to me pt states she has aspirated , and when asked how she know she say she has sore throat. To me pt denies any other complaints and ROS is negative.  She is oriented to person place and think bush is president.  Pt brought from SNF for dyspnea and hypoxia on her baseline 4 L [place] with tachypnea at 30 . Pt has past medical history of DM 2, HTN, HLD,CVA,Seizure, Asthma, vascular, Hypoxic respiratory failure, c/h respiratory failure, new DVT in her last admission. ED Course:   Vitals:   02/14/21 1700 02/14/21 1730 02/14/21 1800 02/14/21 1830  BP: 125/79 127/83 131/72 134/78  Pulse: 87 85 90 87  Resp: (!) 21 12 13 14   Temp:      TempSrc:      SpO2: 96% 99% 99% 98%  Weight:      Height:       In Ed pt is A/O and has difficulty with communication which is from stroke, more aphasia then dysarthria. Labs shows PO2 with O2 of 86, CMP shows glucose of 143 normal kidney functions normal LFTs, initial troponin of 23 with a repeat of 185, patient's EKG is sinus rhythm 86 with nonspecific ST changes in lateral leads.  X-ray is clear without consolidation effusion edema. Admission requested for patient's respiratory failure in the emergency room patient given DuoNeb and aspirin 325 per  Review of Systems:  Review of Systems  Respiratory:         Sore throat.   All other systems reviewed and are negative.   Past Medical History:  Diagnosis Date   Asthma    CVA (cerebrovascular accident due to intracerebral hemorrhage) (HCC)    Diabetes mellitus without complication (HCC)    Hyperlipemia    Hypertension    Seizures (HCC)    Stroke [facility])     Past Surgical History:  Procedure Laterality Date   BRAIN SURGERY     CERVICAL  FUSION       reports that she has quit smoking. She has never used smokeless tobacco. She reports that she does not drink alcohol and does not use drugs.  Allergies  Allergen Reactions   Amoxicillin Rash    Tolerating rocephin 10/29/15   Ciprofloxacin Anaphylaxis   Clindamycin Hcl Swelling, Rash and Other (See Comments)    Other Reaction: BAD RASH AND SWELLING, SKIN BR Severe rash and swelling. Skin break down.   Penicillins Anaphylaxis    Tolerating rocephin 11/01/15   Codeine Nausea And Vomiting   Baclofen Itching and Rash   Contrast Media [Iodinated Contrast Media] Itching and Rash   Erythromycin Rash   Iodides Rash    Povidone iodine; betadine   Latex Itching and Rash   Nickel Rash and Other (See Comments)    OTHER REACTION   Soap Itching and Rash   Sulfa Antibiotics Rash   Tape Rash    (silk tape only) skin break down   Tolterodine Rash   Vancomycin Rash    History reviewed. No pertinent family history.  Prior to Admission medications   Medication Sig Start Date End Date Taking? Authorizing Provider  apixaban (ELIQUIS) 5 MG TABS tablet Take 1 tablet (5 mg total) by  mouth 2 (two) times daily. 09/29/20  Yes Dahal, Melina Schools, MD  ARIPiprazole (ABILIFY) 5 MG tablet Take 5 mg by mouth daily.    Yes [provider]  atorvastatin (LIPITOR) 40 MG tablet Take 40 mg by mouth every morning.   Yes [provider]  clonazePAM (KLONOPIN) 0.5 MG tablet Take 0.5 mg by mouth 2 (two) times daily.   Yes [provider]  divalproex (DEPAKOTE SPRINKLE) 125 MG capsule Take 500 mg by mouth 2 (two) times daily.   Yes [provider]  FLUoxetine (PROZAC) 40 MG capsule Take 40 mg by mouth daily.   Yes [provider]  furosemide (LASIX) 20 MG tablet Take 1 tablet (20 mg total) by mouth daily as needed for fluid or edema. Patient taking differently: Take 10 mg by mouth every other day. 09/22/20  Yes Dahal, Melina Schools, MD  levothyroxine (SYNTHROID) 150 MCG  tablet Take 150 mcg by mouth daily before breakfast.    Yes [provider]  metoprolol tartrate (LOPRESSOR) 25 MG tablet Take 25 mg by mouth 2 (two) times daily.   Yes [provider]  Multiple Vitamin (MULTIVITAMIN WITH MINERALS) TABS tablet Take 1 tablet by mouth daily.   Yes [provider]  neomycin-bacitracin-polymyxin (NEOSPORIN) ointment Apply 1 application topically daily. (Apply to toe wound)   Yes [provider]  nitroGLYCERIN (NITROSTAT) 0.4 MG SL tablet Place 0.4 mg under the tongue every 5 (five) minutes as needed for chest pain.   Yes [provider]  omeprazole (PRILOSEC) 10 MG capsule Take 10 mg by mouth daily. 09/18/15  Yes [provider]  oxyCODONE-acetaminophen (PERCOCET/ROXICET) 5-325 MG tablet Take 1 tablet by mouth 3 (three) times daily.   Yes [provider]  polyethylene glycol (MIRALAX / GLYCOLAX) packet Take 17 g by mouth at bedtime.   Yes [provider]  risperiDONE (RISPERDAL) 0.5 MG tablet Take 0.5 mg by mouth at bedtime.   Yes [provider]  senna (SENOKOT) 8.6 MG tablet Take 2 tablets by mouth 2 (two) times daily as needed for constipation. 06/05/13  Yes [provider]  Rayburn Ma Oil (REFRESH P.M. OP) Apply 1 inch to eye at bedtime as needed (dry eyes).   Yes [provider]    Physical Exam: Vitals:   02/14/21 1700 02/14/21 1730 02/14/21 1800 02/14/21 1830  BP: 125/79 127/83 131/72 134/78  Pulse: 87 85 90 87  Resp: (!) 21 12 13 14   Temp:      TempSrc:      SpO2: 96% 99% 99% 98%  Weight:      Height:       Physical Exam Vitals reviewed.  Constitutional:      Appearance: She is ill-appearing.  HENT:     Head: Normocephalic and atraumatic.     Right Ear: External ear normal.     Left Ear: External ear normal.     Nose: Nose normal.     Mouth/Throat:     Mouth: Mucous membranes are moist.  Eyes:     Extraocular Movements: Extraocular  movements intact.     Pupils: Pupils are equal, round, and reactive to light.  Cardiovascular:     Rate and Rhythm: Normal rate and regular rhythm.     Pulses: Normal pulses.     Heart sounds: Normal heart sounds.  Pulmonary:     Effort: Pulmonary effort is normal.     Breath sounds: Normal breath sounds.  Abdominal:     General: Bowel  sounds are normal. There is no distension.     Palpations: Abdomen is soft. There is no mass.     Tenderness: There is no abdominal tenderness. There is no guarding.     Hernia: No hernia is present.  Musculoskeletal:     Right lower leg: No edema.     Left lower leg: No edema.  Skin:    General: Skin is warm.  Neurological:     Mental Status: She is alert and oriented to person, place, and time.     Motor: Abnormal muscle tone present.     Comments: LUE contracture from her previous stroke and left sided hemiparesis.  Both feet are plantar flexed.   Psychiatric:        Mood and Affect: Mood normal.        Behavior: Behavior normal.     Labs on Admission: I have personally reviewed following labs and imaging studies  No results for input(s): CKTOTAL, CKMB, TROPONINI in the last 72 hours. Lab Results  Component Value Date   WBC 8.2 02/14/2021   HGB 15.0 02/14/2021   HCT 46.6 (H) 02/14/2021   MCV 98.9 02/14/2021   PLT 174 02/14/2021    Recent Labs  Lab 02/14/21 1457  NA 140  K 3.9  CL 107  CO2 23  BUN 22  CREATININE 0.73  CALCIUM 8.8*  PROT 7.1  BILITOT 0.6  ALKPHOS 54  ALT 13  AST 17  GLUCOSE 143*   Lab Results  Component Value Date   CHOL 102 08/09/2011   HDL 34 (L) 08/09/2011   LDLCALC 49 08/09/2011   TRIG 96 08/09/2011   Lab Results  Component Value Date   DDIMER 0.58 (H) 09/18/2020   Invalid input(s): POCBNP   COVID-19 Labs No results for input(s): DDIMER, FERRITIN, LDH, CRP in the last 72 hours. Lab Results  Component Value Date   SARSCOV2NAA NEGATIVE 02/14/2021   SARSCOV2NAA NEGATIVE 09/22/2020    SARSCOV2NAA NEGATIVE 09/18/2020    Radiological Exams on Admission: DG Chest Port 1 View  Result Date: 02/14/2021 CLINICAL DATA:  Possible sepsis EXAM: PORTABLE CHEST 1 VIEW COMPARISON:  Prior chest x-ray 09/18/2020 FINDINGS: Portable frontal view of the chest. The patient is slightly rotated toward the right. Given rotated position, the cardiac and mediastinal contours remain within normal limits. Atherosclerotic calcification is present in the transverse aorta. No focal airspace infiltrate, effusion or pneumothorax. No acute osseous abnormality. IMPRESSION: No active disease. Electronically Signed   By: Malachy MoanHeath  McCullough M.D.   On: 02/14/2021 16:03    EKG: Independently reviewed.  Sinus rhythm 86 with nonspecific ST changes in the lateral leads.   Assessment/Plan: Principal Problem:   Respiratory distress Active Problems:   Seizure (HCC)   Essential hypertension, benign   Type 2 diabetes mellitus with complication, without long-term current use of insulin (HCC)   DVT (deep venous thrombosis) (HCC)   Hypothyroid   H/O: CVA (cerebrovascular accident)   Sacral ulcer (HCC)   Respiratory distress:  Pt tachypnea with RR in 30's  will admit to med tele.  Supplemental oxygen. Pt is now back opn her 3 L baseline.  Cont with Duoneb and MDI as needed.  We will evalaute for PE with V/Q scan.  We will also do ct chest without contrast together.   Seizure: Seizure precaution. Cont keppra.   Htn: Blood pressure 134/78, pulse 87, temperature 97.9 F (36.6 C), temperature source Rectal, resp. rate 14, height 5\' 4"  (1.626 m), weight 72 kg,  SpO2 98 %. Cont pt on metoprolol.  Dm II: Cont SSI. Well controlled on metformin from previous note I dont see metformin in chart we will wait form ed rec.   DVT: Cont eliquis.  We will evaluate for pe  with V/Q scan.   Hypothyroidism: Cont levothyroxine at 150 mcg.   H/O CVA and sacral decub ulcer, difficult to turn pt to evaluate,  We will send  nurse message to document picture in chart     DVT prophylaxis:  Eliquis    Code Status:  DNR   Family Communication:  Drewery,Freda Doctor, hospital(Legal Guardian)  [phone] (Mobile)   Disposition Plan:  Home    Consults called:  None   Admission status: Inpatient.     Gertha CalkinEkta V Johanny Segers MD [facility]  6 PM- 2 AM. Please contact me via secure Chat 6 PM-2 AM. How to contact the [facility] Attending or Consulting provider 7A - 7P or covering provider during after hours 7P -7A, for this patient.   Check the care team in [facility] and look for a) attending/consulting [facility] provider listed and b) the [facility] team listed Log into [email or web address] and use [place]'s universal password to access. If you do not have the password, please contact the hospital operator. Locate the [facility] provider you are looking for under [facility] and page to a number that you can be directly reached. If you still have difficulty reaching the provider, please page the [facility] (Director on Call) for the Hospitalists listed on amion for assistance. [email or web address] 02/14/2021, 7:39 PM

## 2021-02-14 NOTE — ED Provider Notes (Signed)
[facility] Provider Note    Event Date/Time   First MD Initiated Contact with Patient 02/14/21 1455     (approximate)   History   Respiratory Distress   HPI  Tammy Shepard is a 71 y.o. female  with PMH significant for DM2, HTN, HLD, CVA with residual deficits leading to bedbound state, seizure disorder, asthma vascular dementia, COVID infection in 2021 hypoxic respiratory failure on 4 L at baseline who presents via EMS from nursing facility for assessment of respiratory distress.  Patient reportedly was 70% on her chronic 4 L and placed on CPAP for transport.  She is able to follow commands and endorses some shortness of breath and back discomfort is unclear if the back discomfort is new at all today.  Further history is limited from the patient secondary to respiratory distress.  Per EMS she otherwise had stable vitals and was afebrile.      Physical Exam  Triage Vital Signs: ED Triage Vitals  Enc Vitals Group     BP      Pulse      Resp      Temp      Temp src      SpO2      Weight      Height      Head Circumference      Peak Flow      Pain Score      Pain Loc      Pain Edu?      Excl. in [place]?     Most recent vital signs: Vitals:   02/14/21 1602 02/14/21 1618  BP:    Pulse: 88 85  Resp: 18 20  Temp:    SpO2: 98% 99%    General: Awake, appears in moderate distress. CV:  Good peripheral perfusion.  Tachycardic.  No audible murmurs at this time. Resp:  Slightly tachypneic with some rhonchi at the bases.  Patient also seem to have some shallow breathing but does not have significant accessory muscle use. Abd:  No distention.  Soft throughout. Other:  Patient able to answer basic yes/no questions and follow commands in her right upper and right lower extremities.  Left upper extremity is contracted and left lower extremity is fully extended patient is able to follow commands in these extremities.   ED Results / Procedures /  Treatments  Labs (all labs ordered are listed, but only abnormal results are displayed) Labs Reviewed  LACTIC ACID, PLASMA - Abnormal; Notable for the following components:      Result Value   Lactic Acid, Venous 2.2 (*)    All other components within normal limits  LACTIC ACID, PLASMA - Abnormal; Notable for the following components:   Lactic Acid, Venous 2.0 (*)    All other components within normal limits  COMPREHENSIVE METABOLIC PANEL - Abnormal; Notable for the following components:   Glucose, Bld 143 (*)    Calcium 8.8 (*)    Albumin 3.2 (*)    All other components within normal limits  CBC WITH DIFFERENTIAL/PLATELET - Abnormal; Notable for the following components:   HCT 46.6 (*)    Monocytes Absolute 1.3 (*)    All other components within normal limits  APTT - Abnormal; Notable for the following components:   aPTT 45 (*)    All other components within normal limits  BLOOD GAS, VENOUS - Abnormal; Notable for the following components:   pO2, Ven 86.0 (*)    All other  components within normal limits  TROPONIN I (HIGH SENSITIVITY) - Abnormal; Notable for the following components:   Troponin I (High Sensitivity) 23 (*)    All other components within normal limits  RESP PANEL BY RT-PCR (FLU A&B, COVID) ARPGX2  CULTURE, BLOOD (ROUTINE X 2)  CULTURE, BLOOD (ROUTINE X 2)  PROTIME-INR  PROCALCITONIN  URINALYSIS, COMPLETE (UACMP) WITH MICROSCOPIC  BRAIN NATRIURETIC PEPTIDE  TROPONIN I (HIGH SENSITIVITY)     EKG  ECG remarkable sinus rhythm with a ventricular rate of 86, normal axis, unremarkable intervals with nonspecific ST change in lateral leads without clear evidence of other acute ischemia or significant arrhythmia.   RADIOLOGY   Chest x-ray reviewed by myself shows some rotation and atherosclerotic disease without clear focal consolidation, effusion, edema, pneumothorax or other clear acute thoracic process.   PROCEDURES:  Critical Care performed: Yes, see  critical care procedure note(s)  .Critical Care Performed by: Lucrezia Starch, MD Authorized by: Lucrezia Starch, MD   Critical care provider statement:    Critical care time (minutes):  30   Critical care was necessary to treat or prevent imminent or life-threatening deterioration of the following conditions:  Respiratory failure   Critical care was time spent personally by me on the following activities:  Development of treatment plan with patient or surrogate, discussions with consultants, evaluation of patient's response to treatment, examination of patient, ordering and review of laboratory studies, ordering and review of radiographic studies, ordering and performing treatments and interventions, pulse oximetry, re-evaluation of patient's condition and review of old charts   MEDICATIONS ORDERED IN ED: Medications  ipratropium-albuterol (DUONEB) 0.5-2.5 (3) MG/3ML nebulizer solution 3 mL (3 mLs Nebulization Given 02/14/21 1601)     IMPRESSION / MDM / [place] / ED COURSE  I reviewed the triage vital signs and the nursing notes.                              Differential diagnosis includes, but is not limited to asthma exacerbation, aspiration pneumonitis, pneumonia, bronchitis, anemia and metabolic derangements, polypharmacy, sepsis, with lower suspicion for PE as on review of patient's records from facility it seems she is on daily Eliquis twice daily her previous diagnosis of a right lower extremity DVT.  EKG shows some nonspecific findings and tachycardia.  Initial troponin is 23 and will patient is denying any chest pain think is reasonable to trend this.  I suspect some very mild demand ischemia with a lower suspicion for occlusion MI at this time.  CMP shows no significant electrolyte or metabolic derangements.  CBC shows no leukocytosis or acute anemia.  Coagulation studies are unremarkable.  Procalcitonin is undetectable.  Actiq acid remarkable for a pH of 7.34 with a  PCO2 of 48 and a bicarb of 25.9.  COVID influenza PCR is negative.  Initial lactic acid slightly elevated 2.2.  Chest x-ray reviewed by myself shows some rotation and atherosclerotic disease without clear focal consolidation, effusion, edema, pneumothorax or other clear acute thoracic process.  Care patient signed over to assuming provider at approximately 4 PM.  Plan is to follow-up remaining labs and reassess patient.   Above-noted temp to reach facility x2 on patient arrival but this went to voicemail both times.      FINAL CLINICAL IMPRESSION(S) / ED DIAGNOSES   Final diagnoses:  Acute on chronic respiratory failure with hypoxia (HCC)  SOB (shortness of breath)  Anticoagulated  Rx / DC Orders   ED Discharge Orders     None        Note:  This document was prepared using Dragon voice recognition software and may include unintentional dictation errors.   Lucrezia Starch, MD 02/14/21 [phone]

## 2021-02-14 NOTE — ED Notes (Signed)
This RN called RT at this time to request they set up bipap for pt to be able to receive neb tx, RT Weston Brass currently with intubated pt at this time, stated he will come do so when he is available. Xray called at this tim to obtain chest imaging, they stated they were on their way.

## 2021-02-14 NOTE — ED Notes (Signed)
This RN attempted to call this pt's daughter and let her know this pt was going to be admitted

## 2021-02-14 NOTE — ED Triage Notes (Signed)
Pt from [facility], sudden onset of dyspnea, 70's% on baseline 4L [place]. Tachypneic in 30s RR. Medic reports they hear crackles on inspiration. Hx of aspiration.

## 2021-02-14 NOTE — ED Triage Notes (Signed)
HX of vascular dementia, w/ contractures.

## 2021-02-14 NOTE — ED Notes (Signed)
MD Roxan Hockey notified of troponin of 185 at this time.

## 2021-02-14 NOTE — ED Notes (Signed)
This RN spoke to daughter Asher Muir (daughter) updated on plan of care that pt will be admitted to the hospital. Daughter understands all information.  Call [phone] with any questions

## 2021-02-15 ENCOUNTER — Inpatient Hospital Stay: Payer: Medicare (Managed Care)

## 2021-02-15 ENCOUNTER — Other Ambulatory Visit: Payer: Self-pay

## 2021-02-15 DIAGNOSIS — I10 Essential (primary) hypertension: Secondary | ICD-10-CM

## 2021-02-15 DIAGNOSIS — J9621 Acute and chronic respiratory failure with hypoxia: Secondary | ICD-10-CM | POA: Diagnosis not present

## 2021-02-15 DIAGNOSIS — Z7901 Long term (current) use of anticoagulants: Secondary | ICD-10-CM

## 2021-02-15 DIAGNOSIS — R0603 Acute respiratory distress: Secondary | ICD-10-CM | POA: Diagnosis not present

## 2021-02-15 DIAGNOSIS — Z8673 Personal history of transient ischemic attack (TIA), and cerebral infarction without residual deficits: Secondary | ICD-10-CM

## 2021-02-15 DIAGNOSIS — E118 Type 2 diabetes mellitus with unspecified complications: Secondary | ICD-10-CM

## 2021-02-15 DIAGNOSIS — R569 Unspecified convulsions: Secondary | ICD-10-CM

## 2021-02-15 LAB — URINALYSIS, COMPLETE (UACMP) WITH MICROSCOPIC
Bilirubin Urine: NEGATIVE
Glucose, UA: NEGATIVE mg/dL
Ketones, ur: NEGATIVE mg/dL
Nitrite: POSITIVE — AB
Protein, ur: 30 mg/dL — AB
Specific Gravity, Urine: 1.027 (ref 1.005–1.030)
WBC, UA: >50 WBC/hpf — ABNORMAL HIGH (ref 0–5)
pH: 5 (ref 5.0–8.0)

## 2021-02-15 LAB — CBG MONITORING, ED
Glucose-Capillary: 130 mg/dL — ABNORMAL HIGH (ref 70–99)
Glucose-Capillary: 141 mg/dL — ABNORMAL HIGH (ref 70–99)

## 2021-02-15 LAB — TROPONIN I (HIGH SENSITIVITY)
Troponin I (High Sensitivity): 55 ng/L — ABNORMAL HIGH (ref <18)
Troponin I (High Sensitivity): 36 ng/L — ABNORMAL HIGH (ref <18)

## 2021-02-15 LAB — GLUCOSE, CAPILLARY: Glucose-Capillary: 115 mg/dL — ABNORMAL HIGH (ref 70–99)

## 2021-02-15 LAB — MRSA NEXT GEN BY PCR, NASAL: MRSA by PCR Next Gen: NOT DETECTED

## 2021-02-15 MED ORDER — TECHNETIUM TO 99M ALBUMIN AGGREGATED
4.0000 | Freq: Once | INTRAVENOUS | Status: AC | PRN
  Administered 2021-02-15: 4.01 via INTRAVENOUS

## 2021-02-15 MED ORDER — INSULIN ASPART 100 UNIT/ML IJ SOLN
0.0000 [IU] | Freq: Three times a day (TID) | INTRAMUSCULAR | Status: DC
  Administered 2021-02-15 – 2021-02-17 (×4): 1 [IU] via SUBCUTANEOUS
  Filled 2021-02-15 (×2): qty 1

## 2021-02-15 NOTE — ED Notes (Signed)
This RN spoke to [place] from [facility]. Updated on plan of care and that pt will be admitted to the hospital. All questions answered.

## 2021-02-15 NOTE — Progress Notes (Addendum)
[facility]  PROGRESS NOTE  Tammy Shepard VCB:[id number] DOB: 16-Jun-1950 DOA: 02/14/2021 PCP: Lauro Regulus, MD   Brief HPI:   71 year old female was brought to the ED from skilled nursing facility for dyspnea and hypoxemia with tachypnea.  She has past medical history of diabetes mellitus type 2, hypertension, hyperlipidemia, CVA, seizure, asthma, vascular dementia, hypoxic respiratory failure, DVT.  Patient is on oxygen 4 L/min at baseline. In the ED she had difficulty with communication.  Chest x-ray was clear without consolidation or edema.    Subjective   Patient seen and examined, denies any complaints.  Breathing has improved since yesterday.   Assessment/Plan:     Respiratory distress -Unclear etiology -VQ scan ordered for possible PE -Patient already was on Eliquis -Patient is back to her baseline oxygen 3 L/min  Elevated troponin -Troponin was elevated 23, 185 -EKG unremarkable -Patient is asymptomatic -Likely demand ischemia, will repeat troponin this morning  Seizure disorder -Continue seizure precautions -Continue Keppra  Hypertension -Blood pressure is stable -Continue metoprolol  Diabetes mellitus type 2 -Continue sliding scale insulin NovoLog   History of DVT -Continue Eliquis -VQ scan ordered for evaluation of PE  Hypothyroidism -Continue Synthroid  History of CVA and sacral decubitus ulcer, present on admission -Patient has stage 2 decubitus ulcer -Continue care per protocol       Medications     apixaban  5 mg Oral BID   ARIPiprazole  5 mg Oral Daily   atorvastatin  40 mg Oral BH-q7a   clonazePAM  0.5 mg Oral BID   divalproex  500 mg Oral BID   FLUoxetine  40 mg Oral Daily   levothyroxine  150 mcg Oral Q0600   metoprolol tartrate  25 mg Oral BID   oxyCODONE-acetaminophen  1 tablet Oral TID   risperiDONE  0.5 mg Oral QHS   sodium chloride flush  3 mL Intravenous Q12H     Data Reviewed:   CBG:  No results  for input(s): GLUCAP in the last 168 hours.  SpO2: 97 % O2 Flow Rate (L/min): 4 L/min    Vitals:   02/15/21 0800 02/15/21 0830 02/15/21 0900 02/15/21 1100  BP: (!) 113/57 112/61 129/79 110/60  Pulse: 72 73 78 68  Resp: (!) 24 (!) 21 18 20   Temp:      TempSrc:      SpO2: 97% 95% 94% 97%  Weight:      Height:        No intake or output data in the 24 hours ending 02/15/21 1126  No intake/output data recorded.  Filed Weights   02/14/21 1456  Weight: 72 kg    Data Reviewed: Basic Metabolic Panel: Recent Labs  Lab 02/14/21 1457  NA 140  K 3.9  CL 107  CO2 23  GLUCOSE 143*  BUN 22  CREATININE 0.73  CALCIUM 8.8*   Liver Function Tests: Recent Labs  Lab 02/14/21 1457  AST 17  ALT 13  ALKPHOS 54  BILITOT 0.6  PROT 7.1  ALBUMIN 3.2*   No results for input(s): LIPASE, AMYLASE in the last 168 hours. No results for input(s): AMMONIA in the last 168 hours. CBC: Recent Labs  Lab 02/14/21 1457  WBC 8.2  NEUTROABS 4.8  HGB 15.0  HCT 46.6*  MCV 98.9  PLT 174   Cardiac Enzymes: No results for input(s): CKTOTAL, CKMB, CKMBINDEX, TROPONINI in the last 168 hours. BNP (last 3 results) Recent Labs    09/18/20 0925 02/14/21 1457  BNP  20.1 61.9    ProBNP (last 3 results) No results for input(s): PROBNP in the last 8760 hours.  CBG: No results for input(s): GLUCAP in the last 168 hours.     Radiology Reports  DG Chest Port 1 View  Result Date: 02/14/2021 CLINICAL DATA:  Possible sepsis EXAM: PORTABLE CHEST 1 VIEW COMPARISON:  Prior chest x-ray 09/18/2020 FINDINGS: Portable frontal view of the chest. The patient is slightly rotated toward the right. Given rotated position, the cardiac and mediastinal contours remain within normal limits. Atherosclerotic calcification is present in the transverse aorta. No focal airspace infiltrate, effusion or pneumothorax. No acute osseous abnormality. IMPRESSION: No active disease. Electronically Signed   By: Malachy MoanHeath   McCullough M.D.   On: 02/14/2021 16:03       Antibiotics: Anti-infectives (From admission, onward)    None         DVT prophylaxis: Patient is on full dose apixaban  Code Status: DNR  Family Communication: No family at bedside   Consultants:   Procedures:     Objective    Physical Examination:   General: Appears in no acute distress Cardiovascular: S1-S2, regular, no murmur auscultated Respiratory: Clear to auscultation bilaterally Abdomen: Abdomen is soft, nontender, no organomegaly Extremities: No edema in the lower extremities, contracted in left upper extremity Neurologic: Alert, oriented x3, no focal deficit noted   Status is: Inpatient  Dispo: The patient is from: Skilled nursing facility              Anticipated d/c is to: Skilled nursing facility              Anticipated d/c date is: 02/17/2021              Patient currently not stable for discharge  Barrier to discharge-ongoing evaluation for respiratory distress  COVID-19 Labs  No results for input(s): DDIMER, FERRITIN, LDH, CRP in the last 72 hours.  Lab Results  Component Value Date   SARSCOV2NAA NEGATIVE 02/14/2021   SARSCOV2NAA NEGATIVE 09/22/2020   SARSCOV2NAA NEGATIVE 09/18/2020     Pressure Injury 09/18/20 Coccyx Medial;Lower Stage 2 -  Partial thickness loss of dermis presenting as a shallow open injury with a red, pink wound bed without slough. Skin not intact (Active)  09/18/20 1049  Location: Coccyx  Location Orientation: Medial;Lower  Staging: Stage 2 -  Partial thickness loss of dermis presenting as a shallow open injury with a red, pink wound bed without slough.  Wound Description (Comments): Skin not intact  Present on Admission:         Recent Results (from the past 240 hour(s))  Blood Culture (routine x 2)     Status: None (Preliminary result)   Collection Time: 02/14/21  2:57 PM   Specimen: BLOOD  Result Value Ref Range Status   Specimen Description BLOOD  BLOOD RIGHT HAND  Final   Special Requests   Final    Blood Culture results may not be optimal due to an excessive volume of blood received in culture bottles   Culture   Final    NO GROWTH < 24 HOURS Performed at [facility], [address] [place], [place] [address]    Report Status PENDING  Incomplete  Blood Culture (routine x 2)     Status: None (Preliminary result)   Collection Time: 02/14/21  3:02 PM   Specimen: BLOOD  Result Value Ref Range Status   Specimen Description BLOOD BLOOD LEFT WRIST  Final   Special Requests  Final    Blood Culture results may not be optimal due to an excessive volume of blood received in culture bottles   Culture   Final    NO GROWTH < 24 HOURS Performed at [facility], [address] [place], [place] [address]    Report Status PENDING  Incomplete  Resp Panel by RT-PCR (Flu A&B, Covid) Nasopharyngeal Swab     Status: None   Collection Time: 02/14/21  3:22 PM   Specimen: Nasopharyngeal Swab; Nasopharyngeal(NP) swabs in vial transport medium  Result Value Ref Range Status   SARS Coronavirus 2 by RT PCR NEGATIVE NEGATIVE Final    Comment: (NOTE) SARS-CoV-2 target nucleic acids are NOT DETECTED.  The SARS-CoV-2 RNA is generally detectable in upper respiratory specimens during the acute phase of infection. The lowest concentration of SARS-CoV-2 viral copies this assay can detect is 138 copies/mL. A negative result does not preclude SARS-Cov-2 infection and should not be used as the sole basis for treatment or other patient management decisions. A negative result may occur with  improper specimen collection/handling, submission of specimen other than nasopharyngeal swab, presence of viral mutation(s) within the areas targeted by this assay, and inadequate number of viral copies(<138 copies/mL). A negative result must be combined with clinical observations, patient history, and epidemiological information. The expected result  is Negative.  Fact Sheet for Patients:  BloggerCourse.com  Fact Sheet for Healthcare Providers:  SeriousBroker.it  This test is no t yet approved or cleared by the [place] FDA and  has been authorized for detection and/or diagnosis of SARS-CoV-2 by FDA under an Emergency Use Authorization (EUA). This EUA will remain  in effect (meaning this test can be used) for the duration of the COVID-19 declaration under Section 564(b)(1) of the Act, 21 U.S.C.section 360bbb-3(b)(1), unless the authorization is terminated  or revoked sooner.       Influenza A by PCR NEGATIVE NEGATIVE Final   Influenza B by PCR NEGATIVE NEGATIVE Final    Comment: (NOTE) The Xpert Xpress SARS-CoV-2/FLU/RSV plus assay is intended as an aid in the diagnosis of influenza from Nasopharyngeal swab specimens and should not be used as a sole basis for treatment. Nasal washings and aspirates are unacceptable for Xpert Xpress SARS-CoV-2/FLU/RSV testing.  Fact Sheet for Patients: BloggerCourse.com  Fact Sheet for Healthcare Providers: SeriousBroker.it  This test is not yet approved or cleared by the [place] FDA and has been authorized for detection and/or diagnosis of SARS-CoV-2 by FDA under an Emergency Use Authorization (EUA). This EUA will remain in effect (meaning this test can be used) for the duration of the COVID-19 declaration under Section 564(b)(1) of the Act, 21 U.S.C. section 360bbb-3(b)(1), unless the authorization is terminated or revoked.  Performed at [facility], [address] [place], [place] [address]     Meredeth Ide   [facility] If 7PM-7AM, please contact night-coverage at [email or web address], Office  [phone]   02/15/2021, 11:26 AM  LOS: 1 day

## 2021-02-15 NOTE — ED Notes (Signed)
Pt in nuclear medicine at this time. Not able to obtain troponin at this time. Will draw once patient is back on floor.

## 2021-02-15 NOTE — ED Notes (Signed)
Pt returned from scan.

## 2021-02-15 NOTE — Progress Notes (Signed)
Redness noted to right hand and right forearm. Pt with bleeding PIV to posterior right hand. This nurse removed IV, site was bleeding profusely requiring this nurse to hold pressure for several minutes. Two 4x4 gauze applied with Coban for pressure. Pt denies any itchiness or pain associated with redness. Non-blanchable.

## 2021-02-15 NOTE — ED Notes (Signed)
CBG 130 

## 2021-02-16 DIAGNOSIS — R0603 Acute respiratory distress: Secondary | ICD-10-CM | POA: Diagnosis not present

## 2021-02-16 DIAGNOSIS — Z7901 Long term (current) use of anticoagulants: Secondary | ICD-10-CM | POA: Diagnosis not present

## 2021-02-16 DIAGNOSIS — J9621 Acute and chronic respiratory failure with hypoxia: Secondary | ICD-10-CM | POA: Diagnosis not present

## 2021-02-16 DIAGNOSIS — I10 Essential (primary) hypertension: Secondary | ICD-10-CM | POA: Diagnosis not present

## 2021-02-16 LAB — GLUCOSE, CAPILLARY
Glucose-Capillary: 103 mg/dL — ABNORMAL HIGH (ref 70–99)
Glucose-Capillary: 128 mg/dL — ABNORMAL HIGH (ref 70–99)
Glucose-Capillary: 116 mg/dL — ABNORMAL HIGH (ref 70–99)
Glucose-Capillary: 132 mg/dL — ABNORMAL HIGH (ref 70–99)

## 2021-02-16 LAB — VALPROIC ACID LEVEL: Valproic Acid Lvl: 50 ug/mL (ref 50.0–100.0)

## 2021-02-16 MED ORDER — ZINC OXIDE 40 % EX OINT
TOPICAL_OINTMENT | CUTANEOUS | Status: DC | PRN
  Filled 2021-02-16: qty 113

## 2021-02-16 MED ORDER — SODIUM CHLORIDE 0.9 % IV SOLN
1.0000 g | INTRAVENOUS | Status: DC
  Administered 2021-02-16 – 2021-02-18 (×3): 1 g via INTRAVENOUS
  Filled 2021-02-16 (×3): qty 1

## 2021-02-16 MED ORDER — SODIUM CHLORIDE 0.9 % IV SOLN: INTRAVENOUS | Status: DC

## 2021-02-16 NOTE — [place] FL2 (Signed)
[place] MEDICAID FL2 LEVEL OF CARE SCREENING TOOL     IDENTIFICATION  Patient Name: Tammy Shepard Birthdate: 12/05/1950 Sex: female Admission Date (Current Location): 02/14/2021  [facility] and [place] Number:  Chiropodist and Address:  [facility], [address], [place], [place] [address]      Provider Number: [id number]  Attending Physician Name and Address:  Meredeth Ide, MD  Relative Name and Phone Number:       Current Level of Care: Hospital Recommended Level of Care: Skilled Nursing Facility Prior Approval Number:    Date Approved/Denied:   PASRR Number: [id number]  Discharge Plan: SNF    Current Diagnoses: Patient Active Problem List   Diagnosis Date Noted   Respiratory distress 02/14/2021   DVT (deep venous thrombosis) (HCC) 02/14/2021   Seizure (HCC) 02/14/2021   Pressure injury of skin 09/18/2020   CAP (community acquired pneumonia) 09/18/2020   Type 2 diabetes mellitus with complication, without long-term current use of insulin (HCC) 04/21/2016   H/O: CVA (cerebrovascular accident) 11/04/2015   Essential hypertension, benign 11/04/2015   Fever in adult    Generalized weakness    Recurrent UTI 10/31/2015   Fever 10/31/2015   UTI (lower urinary tract infection) 10/31/2015   Sacral ulcer (HCC) 06/29/2014   Hypothyroid 11/28/2012    Orientation RESPIRATION BLADDER Height & Weight     Self, Place  O2 (Nasal Cannula 4 L) Incontinent, External catheter Weight: 158 lb 11.7 oz (72 kg) Height:  5\' 4"  (162.6 cm)  BEHAVIORAL SYMPTOMS/MOOD NEUROLOGICAL BOWEL NUTRITION STATUS  Other (Comment) (Flat affect.)  (History of seizures) Incontinent Diet (Carb modified)  AMBULATORY STATUS COMMUNICATION OF NEEDS Skin     Verbally PU Stage and Appropriate Care     PU Stage 3 Dressing:  (Mid coccyx: Faom every 3 days.)                 Personal Care Assistance Level of Assistance              Functional Limitations  Info  Sight, Hearing, Speech Sight Info: Adequate Hearing Info: Adequate Speech Info: Adequate    SPECIAL CARE FACTORS FREQUENCY                       Contractures Contractures Info: Present    Additional Factors Info  Code Status, Allergies Code Status Info: DNR Allergies Info: Amoxicillin, Ciprofloxacin, Clindamycin Hcl, Penicillins, Codeine, Baclofen, Contrast Media (Iodinated Contrast Media), Erythromycin, Iodides, Latex, Nickel, Soap, Sulfa Antibiotics, Tape, Tolterodine, Vancomycin           Current Medications (02/16/2021):  This is the current hospital active medication list Current Facility-Administered Medications  Medication Dose Route Frequency Provider Last Rate Last Admin   0.9 %  sodium chloride infusion  250 mL Intravenous PRN 04/16/2021, MD       acetaminophen (TYLENOL) tablet 650 mg  650 mg Oral Q6H PRN Gertha Calkin, MD   650 mg at 02/15/21 1944   Or   acetaminophen (TYLENOL) suppository 650 mg  650 mg Rectal Q6H PRN 04/15/21, MD       apixaban (ELIQUIS) tablet 5 mg  5 mg Oral BID Gertha Calkin V, MD   5 mg at 02/16/21 0904   ARIPiprazole (ABILIFY) tablet 5 mg  5 mg Oral Daily 04/16/21, MD   5 mg at 02/16/21 0904   atorvastatin (LIPITOR) tablet 40 mg  40 mg Oral BH-q7a  Gertha Calkin, MD   40 mg at 02/16/21 9311   clonazePAM (KLONOPIN) tablet 0.5 mg  0.5 mg Oral BID Irena Cords V, MD   0.5 mg at 02/16/21 0901   divalproex (DEPAKOTE SPRINKLE) capsule 500 mg  500 mg Oral BID Gertha Calkin, MD   500 mg at 02/16/21 0904   FLUoxetine (PROZAC) capsule 40 mg  40 mg Oral Daily Irena Cords V, MD   40 mg at 02/16/21 0900   insulin aspart (novoLOG) injection 0-9 Units  0-9 Units Subcutaneous TID WC Meredeth Ide, MD   1 Units at 02/15/21 1705   levothyroxine (SYNTHROID) tablet 150 mcg  150 mcg Oral Q0600 Gertha Calkin, MD   150 mcg at 02/16/21 2162   metoprolol tartrate (LOPRESSOR) tablet 25 mg  25 mg Oral BID Gertha Calkin, MD   25 mg at 02/16/21 0900    oxyCODONE-acetaminophen (PERCOCET/ROXICET) 5-325 MG per tablet 1 tablet  1 tablet Oral TID Gertha Calkin, MD   1 tablet at 02/16/21 0901   risperiDONE (RISPERDAL) tablet 0.5 mg  0.5 mg Oral QHS Irena Cords V, MD   0.5 mg at 02/15/21 2149   sodium chloride flush (NS) 0.9 % injection 3 mL  3 mL Intravenous Q12H Irena Cords V, MD   3 mL at 02/16/21 0904   sodium chloride flush (NS) 0.9 % injection 3 mL  3 mL Intravenous PRN Gertha Calkin, MD         Discharge Medications: Please see discharge summary for a list of discharge medications.  Relevant Imaging Results:  Relevant Lab Results:   Additional Information SS#: [id number]  Margarito Liner, LCSW

## 2021-02-16 NOTE — Progress Notes (Addendum)
[facility]  PROGRESS NOTE  Woodroe ModeCindy S Strawder ZOX:[id number] DOB: 1950/02/12 DOA: 02/14/2021 PCP: Lauro RegulusAnderson, Marshall W, MD   Brief HPI:   71 year old female was brought to the ED from skilled nursing facility for dyspnea and hypoxemia with tachypnea.  She has past medical history of diabetes mellitus type 2, hypertension, hyperlipidemia, CVA, seizure, asthma, vascular dementia, hypoxic respiratory failure, DVT.  Patient is on oxygen 4 L/min at baseline.  In the ED she had difficulty with communication.  Chest x-ray was clear without consolidation or edema.  Subjective   Patient seen and examined, she is lethargic this morning.  VQ scan was negative for pulmonary embolism.   Assessment/Plan:    Respiratory distress -Unclear etiology -VQ scan was very low probability for pulmonary embolism  -Patient already was on Eliquis -Patient is back to her baseline oxygen 3 L/min  Lethargy -Patient is lethargic this morning -She does have abnormal UA -We will obtain urine culture -Empirically started on ceftriaxone 1 g IV every 24 hours -We will start IV normal saline at 75 mill per hour -Patient is on valproic acid, will check valproic acid level.  Elevated troponin -Troponin was elevated 23, 185 -Repeat troponin was downtrending 55, 36 -EKG unremarkable -Patient is asymptomatic -Likely demand ischemia  Seizure disorder -Continue seizure precautions -Continue valproic acid  Hypertension -Blood pressure is stable -Continue metoprolol  Diabetes mellitus type 2 -Continue sliding scale insulin NovoLog  History of DVT -Continue Eliquis -VQ scan was low probability for PE   Hypothyroidism -Continue Synthroid  History of CVA and sacral decubitus ulcer, present on admission -Patient has stage 2 decubitus ulcer -Continue care per protocol   Medications     apixaban  5 mg Oral BID   ARIPiprazole  5 mg Oral Daily   atorvastatin  40 mg Oral BH-q7a   clonazePAM  0.5 mg  Oral BID   divalproex  500 mg Oral BID   FLUoxetine  40 mg Oral Daily   insulin aspart  0-9 Units Subcutaneous TID WC   levothyroxine  150 mcg Oral Q0600   metoprolol tartrate  25 mg Oral BID   oxyCODONE-acetaminophen  1 tablet Oral TID   risperiDONE  0.5 mg Oral QHS   sodium chloride flush  3 mL Intravenous Q12H     Data Reviewed:   CBG:  Recent Labs  Lab 02/15/21 1209 02/15/21 1702 02/15/21 2052 02/16/21 0755 02/16/21 1128  GLUCAP 130* 141* 115* 103* 128*    SpO2: 96 % O2 Flow Rate (L/min): 4 L/min    Vitals:   02/16/21 0610 02/16/21 0802 02/16/21 0859 02/16/21 1211  BP: (!) 114/49 (!) 130/48 138/60 (!) 146/74  Pulse: 65 65  76  Resp: 16 (!) 21  18  Temp: (!) 97.5 F (36.4 C) 98.5 F (36.9 C)  98.1 F (36.7 C)  TempSrc: Oral Oral  Oral  SpO2: 94% 99%  96%  Weight:      Height:         Intake/Output Summary (Last 24 hours) at 02/16/2021 1240 Last data filed at 02/16/2021 0900 Gross per 24 hour  Intake 120 ml  Output --  Net 120 ml    No intake/output data recorded.  Filed Weights   02/14/21 1456  Weight: 72 kg    Data Reviewed: Basic Metabolic Panel: Recent Labs  Lab 02/14/21 1457  NA 140  K 3.9  CL 107  CO2 23  GLUCOSE 143*  BUN 22  CREATININE 0.73  CALCIUM 8.8*   Liver  Function Tests: Recent Labs  Lab 02/14/21 1457  AST 17  ALT 13  ALKPHOS 54  BILITOT 0.6  PROT 7.1  ALBUMIN 3.2*   No results for input(s): LIPASE, AMYLASE in the last 168 hours. No results for input(s): AMMONIA in the last 168 hours. CBC: Recent Labs  Lab 02/14/21 1457  WBC 8.2  NEUTROABS 4.8  HGB 15.0  HCT 46.6*  MCV 98.9  PLT 174   Cardiac Enzymes: No results for input(s): CKTOTAL, CKMB, CKMBINDEX, TROPONINI in the last 168 hours. BNP (last 3 results) Recent Labs    09/18/20 0925 02/14/21 1457  BNP 20.1 61.9    ProBNP (last 3 results) No results for input(s): PROBNP in the last 8760 hours.  CBG: Recent Labs  Lab 02/15/21 1209  02/15/21 1702 02/15/21 2052 02/16/21 0755 02/16/21 1128  GLUCAP 130* 141* 115* 103* 128*       Radiology Reports  NM Pulmonary Perfusion  Result Date: 02/15/2021 CLINICAL DATA:  PE suspected EXAM: NUCLEAR MEDICINE PERFUSION LUNG SCAN TECHNIQUE: Perfusion images were obtained in multiple projections after intravenous injection of radiopharmaceutical. Ventilation scans intentionally deferred if perfusion scan and chest x-ray adequate for interpretation during COVID 19 epidemic. RADIOPHARMACEUTICALS:  4.01 mCi Tc-27m MAA IV COMPARISON:  Chest radiograph, 02/14/2021 FINDINGS: Normal, homogeneous bilateral pulmonary perfusion. No suspicious perfusion defects. IMPRESSION: Very low probability examination for pulmonary embolism by modified perfusion only PIOPED criteria (PE absent). Electronically Signed   By: Jearld Lesch M.D.   On: 02/15/2021 12:29   DG Chest Port 1 View  Result Date: 02/14/2021 CLINICAL DATA:  Possible sepsis EXAM: PORTABLE CHEST 1 VIEW COMPARISON:  Prior chest x-ray 09/18/2020 FINDINGS: Portable frontal view of the chest. The patient is slightly rotated toward the right. Given rotated position, the cardiac and mediastinal contours remain within normal limits. Atherosclerotic calcification is present in the transverse aorta. No focal airspace infiltrate, effusion or pneumothorax. No acute osseous abnormality. IMPRESSION: No active disease. Electronically Signed   By: Malachy Moan M.D.   On: 02/14/2021 16:03       Antibiotics: Anti-infectives (From admission, onward)    Start     Dose/Rate Route Frequency Ordered Stop   02/16/21 1330  cefTRIAXone (ROCEPHIN) 1 g in sodium chloride 0.9 % 100 mL IVPB        1 g 200 mL/hr over 30 Minutes Intravenous Every 24 hours 02/16/21 1239           DVT prophylaxis: Patient is on full dose apixaban  Code Status: DNR  Family Communication: No family at bedside   Consultants:   Procedures:     Objective    Physical  Examination:  General-appears in no acute distress Heart-S1-S2, regular, no murmur auscultated Lungs-clear to auscultation bilaterally, no wheezing or crackles auscultated Abdomen-soft, nontender, no organomegaly Extremities-contractures in left upper extremity  Neuro-alert, oriented x3, no focal deficit noted   Status is: Inpatient  Dispo: The patient is from: Skilled nursing facility              Anticipated d/c is to: Skilled nursing facility              Anticipated d/c date is: 02/17/2021              Patient currently not stable for discharge  Barrier to discharge-ongoing evaluation for respiratory distress  COVID-19 Labs  No results for input(s): DDIMER, FERRITIN, LDH, CRP in the last 72 hours.  Lab Results  Component Value Date   SARSCOV2NAA NEGATIVE  02/14/2021   SARSCOV2NAA NEGATIVE 09/22/2020   SARSCOV2NAA NEGATIVE 09/18/2020     Pressure Injury 02/15/21 Coccyx Mid Stage 3 -  Full thickness tissue loss. Subcutaneous fat may be visible but bone, tendon or muscle are NOT exposed. (Active)  02/15/21 1829  Location: Coccyx  Location Orientation: Mid  Staging: Stage 3 -  Full thickness tissue loss. Subcutaneous fat may be visible but bone, tendon or muscle are NOT exposed.  Wound Description (Comments):   Present on Admission: Yes        Recent Results (from the past 240 hour(s))  Blood Culture (routine x 2)     Status: None (Preliminary result)   Collection Time: 02/14/21  2:57 PM   Specimen: BLOOD  Result Value Ref Range Status   Specimen Description BLOOD BLOOD RIGHT HAND  Final   Special Requests   Final    Blood Culture results may not be optimal due to an excessive volume of blood received in culture bottles   Culture   Final    NO GROWTH 2 DAYS Performed at [facility], [address] [place], [place] [address]    Report Status PENDING  Incomplete  Blood Culture (routine x 2)     Status: None (Preliminary result)   Collection Time:  02/14/21  3:02 PM   Specimen: BLOOD  Result Value Ref Range Status   Specimen Description BLOOD BLOOD LEFT WRIST  Final   Special Requests   Final    Blood Culture results may not be optimal due to an excessive volume of blood received in culture bottles   Culture   Final    NO GROWTH 2 DAYS Performed at [facility], [address] [place], [place] [address]    Report Status PENDING  Incomplete  Resp Panel by RT-PCR (Flu A&B, Covid) Nasopharyngeal Swab     Status: None   Collection Time: 02/14/21  3:22 PM   Specimen: Nasopharyngeal Swab; Nasopharyngeal(NP) swabs in vial transport medium  Result Value Ref Range Status   SARS Coronavirus 2 by RT PCR NEGATIVE NEGATIVE Final    Comment: (NOTE) SARS-CoV-2 target nucleic acids are NOT DETECTED.  The SARS-CoV-2 RNA is generally detectable in upper respiratory specimens during the acute phase of infection. The lowest concentration of SARS-CoV-2 viral copies this assay can detect is 138 copies/mL. A negative result does not preclude SARS-Cov-2 infection and should not be used as the sole basis for treatment or other patient management decisions. A negative result may occur with  improper specimen collection/handling, submission of specimen other than nasopharyngeal swab, presence of viral mutation(s) within the areas targeted by this assay, and inadequate number of viral copies(<138 copies/mL). A negative result must be combined with clinical observations, patient history, and epidemiological information. The expected result is Negative.  Fact Sheet for Patients:  BloggerCourse.comhttps://www.fda.gov/media/152166/download  Fact Sheet for Healthcare Providers:  SeriousBroker.ithttps://www.fda.gov/media/152162/download  This test is no t yet approved or cleared by the [place] FDA and  has been authorized for detection and/or diagnosis of SARS-CoV-2 by FDA under an Emergency Use Authorization (EUA). This EUA will remain  in effect (meaning this test  can be used) for the duration of the COVID-19 declaration under Section 564(b)(1) of the Act, 21 U.S.C.section 360bbb-3(b)(1), unless the authorization is terminated  or revoked sooner.       Influenza A by PCR NEGATIVE NEGATIVE Final   Influenza B by PCR NEGATIVE NEGATIVE Final    Comment: (NOTE) The Xpert Xpress SARS-CoV-2/FLU/RSV plus assay  is intended as an aid in the diagnosis of influenza from Nasopharyngeal swab specimens and should not be used as a sole basis for treatment. Nasal washings and aspirates are unacceptable for Xpert Xpress SARS-CoV-2/FLU/RSV testing.  Fact Sheet for Patients: BloggerCourse.com  Fact Sheet for Healthcare Providers: SeriousBroker.it  This test is not yet approved or cleared by the [place] FDA and has been authorized for detection and/or diagnosis of SARS-CoV-2 by FDA under an Emergency Use Authorization (EUA). This EUA will remain in effect (meaning this test can be used) for the duration of the COVID-19 declaration under Section 564(b)(1) of the Act, 21 U.S.C. section 360bbb-3(b)(1), unless the authorization is terminated or revoked.  Performed at [facility], [address] [place], [place] [address]   MRSA Next Gen by PCR, Nasal     Status: None   Collection Time: 02/15/21 10:11 PM   Specimen: Nasal Mucosa; Nasal Swab  Result Value Ref Range Status   MRSA by PCR Next Gen NOT DETECTED NOT DETECTED Final    Comment: (NOTE) The GeneXpert MRSA Assay (FDA approved for NASAL specimens only), is one component of a comprehensive MRSA colonization surveillance program. It is not intended to diagnose MRSA infection nor to guide or monitor treatment for MRSA infections. Test performance is not FDA approved in patients less than 13 years old. Performed at [facility], [address], [place], [place] [address]     Meredeth Ide   [facility] If 7PM-7AM,  please contact night-coverage at [email or web address], Office  [phone]   02/16/2021, 12:40 PM  LOS: 2 days            Dyspnea

## 2021-02-16 NOTE — TOC Initial Note (Addendum)
Transition of Care [facility]) - Initial/Assessment Note    Patient Details  Name: Tammy Shepard MRN: [id number] Date of Birth: 20-Aug-1950  [facility] CM/SW Contact:    Candie Chroman, LCSW Phone Number: 02/16/2021, 9:32 AM  Clinical Narrative:  Per chart review, patient was admitted from [facility] Admissions coordinator confirmed patient is a long-term resident. Left voicemail for legal guardian.              2:10 pm: Per MD note, potential discharge back to SNF tomorrow. Left message for admissions coordinator to notify. Still no call back from legal guardian so notified her supervisor of plan.  Expected Discharge Plan: Skilled Nursing Facility Barriers to Discharge: Continued Medical Work up   Patient Goals and CMS Choice     Choice offered to / list presented to : NA  Expected Discharge Plan and Services Expected Discharge Plan: [place] Acute Care Choice: Resumption of Svcs/PTA Provider Living arrangements for the past 2 months: [place]                                      Prior Living Arrangements/Services Living arrangements for the past 2 months: [place] Lives with:: Facility Resident Patient language and need for interpreter reviewed:: Yes Do you feel safe going back to the place where you live?: Yes      Need for Family Participation in Patient Care: Yes (Comment) Care giver support system in place?: Yes (comment)   Criminal Activity/Legal Involvement Pertinent to Current Situation/Hospitalization: No - Comment as needed  Activities of Daily Living Home Assistive Devices/Equipment: None ADL Screening (condition at time of admission) Patient's cognitive ability adequate to safely complete daily activities?: No Is the patient deaf or have difficulty hearing?: No Does the patient have difficulty seeing, even when wearing glasses/contacts?: No Does the patient have difficulty  concentrating, remembering, or making decisions?: Yes Patient able to express need for assistance with ADLs?: Yes Does the patient have difficulty dressing or bathing?: Yes Independently performs ADLs?: No Does the patient have difficulty walking or climbing stairs?: Yes Weakness of Legs: Both Weakness of Arms/Hands: Both  Permission Sought/Granted Permission sought to share information with : Facility Sport and exercise psychologist, Guardian    Share Information with NAME: Nigel Sloop  Permission granted to share info w AGENCY: [facility]  Permission granted to share info w Relationship: Legal Guardian  Permission granted to share info w Contact Information: [phone]  Emotional Assessment Appearance:: Appears stated age Attitude/Demeanor/Rapport: Unable to Assess Affect (typically observed): Unable to Assess Orientation: : Oriented to Self, Oriented to Place Alcohol / Substance Use: Not Applicable Psych Involvement: No (comment)  Admission diagnosis:  Respiratory distress [R06.03] SOB (shortness of breath) [R06.02] Anticoagulated [Z79.01] Acute on chronic respiratory failure with hypoxia (HCC) [J96.21] Patient Active Problem List   Diagnosis Date Noted   Respiratory distress 02/14/2021   DVT (deep venous thrombosis) ([place]) 02/14/2021   Seizure ([place]) 02/14/2021   Pressure injury of skin 09/18/2020   CAP (community acquired pneumonia) 09/18/2020   Type 2 diabetes mellitus with complication, without long-term current use of insulin ([place]) 04/21/2016   H/O: CVA (cerebrovascular accident) 11/04/2015   Essential hypertension, benign 11/04/2015   Fever in adult    Generalized weakness    Recurrent UTI 10/31/2015   Fever 10/31/2015   UTI (lower urinary tract infection) 10/31/2015  Sacral ulcer ([place]) 06/29/2014   Hypothyroid 11/28/2012   PCP:  Kirk Ruths, MD Pharmacy:   [facility], [address] [place] [address] Phone: [phone] Fax: [phone]     Social Determinants of Health (SDOH) Interventions    Readmission Risk Interventions No flowsheet data found.

## 2021-02-17 DIAGNOSIS — J9621 Acute and chronic respiratory failure with hypoxia: Secondary | ICD-10-CM | POA: Diagnosis not present

## 2021-02-17 DIAGNOSIS — Z7901 Long term (current) use of anticoagulants: Secondary | ICD-10-CM | POA: Diagnosis not present

## 2021-02-17 DIAGNOSIS — R0603 Acute respiratory distress: Secondary | ICD-10-CM | POA: Diagnosis not present

## 2021-02-17 DIAGNOSIS — I10 Essential (primary) hypertension: Secondary | ICD-10-CM | POA: Diagnosis not present

## 2021-02-17 LAB — CBC
HCT: 39.6 % (ref 36.0–46.0)
Hemoglobin: 12.7 g/dL (ref 12.0–15.0)
MCH: 32.2 pg (ref 26.0–34.0)
MCHC: 32.1 g/dL (ref 30.0–36.0)
MCV: 100.3 fL — ABNORMAL HIGH (ref 80.0–100.0)
Platelets: 141 10*3/uL — ABNORMAL LOW (ref 150–400)
RBC: 3.95 MIL/uL (ref 3.87–5.11)
RDW: 13.4 % (ref 11.5–15.5)
WBC: 5.8 10*3/uL (ref 4.0–10.5)
nRBC: 0 % (ref 0.0–0.2)

## 2021-02-17 LAB — COMPREHENSIVE METABOLIC PANEL
ALT: 10 U/L (ref 0–44)
AST: 10 U/L — ABNORMAL LOW (ref 15–41)
Albumin: 2.8 g/dL — ABNORMAL LOW (ref 3.5–5.0)
Alkaline Phosphatase: 49 U/L (ref 38–126)
Anion gap: 7 (ref 5–15)
BUN: 22 mg/dL (ref 8–23)
CO2: 29 mmol/L (ref 22–32)
Calcium: 8.3 mg/dL — ABNORMAL LOW (ref 8.9–10.3)
Chloride: 108 mmol/L (ref 98–111)
Creatinine, Ser: 0.61 mg/dL (ref 0.44–1.00)
GFR, Estimated: >60 mL/min (ref >60)
Glucose, Bld: 95 mg/dL (ref 70–99)
Potassium: 4.2 mmol/L (ref 3.5–5.1)
Sodium: 144 mmol/L (ref 135–145)
Total Bilirubin: 0.3 mg/dL (ref 0.3–1.2)
Total Protein: 6 g/dL — ABNORMAL LOW (ref 6.5–8.1)

## 2021-02-17 LAB — URINE CULTURE

## 2021-02-17 LAB — GLUCOSE, CAPILLARY
Glucose-Capillary: 85 mg/dL (ref 70–99)
Glucose-Capillary: 116 mg/dL — ABNORMAL HIGH (ref 70–99)
Glucose-Capillary: 141 mg/dL — ABNORMAL HIGH (ref 70–99)
Glucose-Capillary: 161 mg/dL — ABNORMAL HIGH (ref 70–99)

## 2021-02-17 MED ORDER — GUAIFENESIN ER 600 MG PO TB12
1200.0000 mg | ORAL_TABLET | Freq: Two times a day (BID) | ORAL | Status: DC
  Administered 2021-02-18: 10:00:00 1200 mg via ORAL
  Filled 2021-02-17: qty 2

## 2021-02-17 MED ORDER — ACETYLCYSTEINE 20 % IN SOLN: 2.0000 mL | Freq: Three times a day (TID) | RESPIRATORY_TRACT | Status: DC

## 2021-02-17 NOTE — Plan of Care (Signed)
  Problem: Health Behavior/Discharge Planning: Goal: Ability to manage health-related needs will improve Outcome: Progressing   

## 2021-02-17 NOTE — Care Management Important Message (Signed)
Important Message  Patient Details  Name: Tammy Shepard MRN: [id number] Date of Birth: 1950/05/14   Medicare Important Message Given:  N/A - LOS <3 / Initial given by admissions     Olegario Messier A Aadya Kindler 02/17/2021, 9:08 AM

## 2021-02-17 NOTE — Progress Notes (Signed)
[facility]  PROGRESS NOTE  Woodroe ModeCindy S Harpster OZH:[id number] DOB: Mar 18, 1950 DOA: 02/14/2021 PCP: Lauro RegulusAnderson, Marshall W, MD   Brief HPI:   71 year old female was brought to the ED from skilled nursing facility for dyspnea and hypoxemia with tachypnea.  She has past medical history of diabetes mellitus type 2, hypertension, hyperlipidemia, CVA, seizure, asthma, vascular dementia, hypoxic respiratory failure, DVT.  Patient is on oxygen 4 L/min at baseline.  In the ED she had difficulty with communication.  Chest x-ray was clear without consolidation or edema.  Subjective   Patient is much more alert after starting antibiotics and IV fluids.   Assessment/Plan:    Respiratory distress -Unclear etiology -VQ scan was very low probability for pulmonary embolism  -Patient already was on Eliquis -Patient is back to her baseline oxygen 3 L/min  Lethargy -Patient was lethargic yesterday  -She does have abnormal UA -Urine culture only grew multiple species -Empirically started on ceftriaxone 1 g IV every 24 hours -Continue IV normal saline at 75 mill per hour -Patient is on valproic acid, valproic acid level is 50 . -I will continue with IV fluids and antibiotics for 1 more day, and if patient continues to do well we will likely discharge back to skilled facility in a.m. -We will obtain COVID-19 test in anticipation for discharge in a.m.  Elevated troponin -Troponin was elevated 23, 185 -Repeat troponin was downtrending 55, 36 -EKG unremarkable -Patient is asymptomatic -Likely demand ischemia  Seizure disorder -Continue seizure precautions -Continue valproic acid -Valproic acid level 50  Hypertension -Blood pressure is stable -Continue metoprolol  Diabetes mellitus type 2 -Continue sliding scale insulin NovoLog -CBG well controlled  History of DVT -Continue Eliquis -VQ scan was low probability for PE   Hypothyroidism -Continue Synthroid  History of CVA and sacral  decubitus ulcer, present on admission -Patient has stage 2 decubitus ulcer -Continue care per protocol   Medications     apixaban  5 mg Oral BID   ARIPiprazole  5 mg Oral Daily   atorvastatin  40 mg Oral BH-q7a   clonazePAM  0.5 mg Oral BID   divalproex  500 mg Oral BID   FLUoxetine  40 mg Oral Daily   insulin aspart  0-9 Units Subcutaneous TID WC   levothyroxine  150 mcg Oral Q0600   metoprolol tartrate  25 mg Oral BID   oxyCODONE-acetaminophen  1 tablet Oral TID   risperiDONE  0.5 mg Oral QHS   sodium chloride flush  3 mL Intravenous Q12H     Data Reviewed:   CBG:  Recent Labs  Lab 02/16/21 0755 02/16/21 1128 02/16/21 1701 02/16/21 1955 02/17/21 0812  GLUCAP 103* 128* 116* 132* 85    SpO2: 100 % O2 Flow Rate (L/min): 4 L/min    Vitals:   02/17/21 0400 02/17/21 0814 02/17/21 0820 02/17/21 1222  BP: 114/61 (!) 118/51 122/60 (!) 123/55  Pulse: 62 (!) 59  66  Resp: 18 18  19   Temp: 97.8 F (36.6 C) 97.7 F (36.5 C)  98.2 F (36.8 C)  TempSrc: Axillary Oral  Oral  SpO2: 100% 100%  100%  Weight:      Height:         Intake/Output Summary (Last 24 hours) at 02/17/2021 1237 Last data filed at 02/17/2021 1010 Gross per 24 hour  Intake 2583.06 ml  Output 400 ml  Net 2183.06 ml    01/08 1901 - 01/10 0700 In: 1818.9 [P.O.:700; I.V.:993.6] Out: 400 [Urine:400]  [employer]  02/14/21 1456  Weight: 72 kg    Data Reviewed: Basic Metabolic Panel: Recent Labs  Lab 02/14/21 1457 02/17/21 0938  NA 140 144  K 3.9 4.2  CL 107 108  CO2 23 29  GLUCOSE 143* 95  BUN 22 22  CREATININE 0.73 0.61  CALCIUM 8.8* 8.3*   Liver Function Tests: Recent Labs  Lab 02/14/21 1457 02/17/21 0938  AST 17 10*  ALT 13 10  ALKPHOS 54 49  BILITOT 0.6 0.3  PROT 7.1 6.0*  ALBUMIN 3.2* 2.8*   No results for input(s): LIPASE, AMYLASE in the last 168 hours. No results for input(s): AMMONIA in the last 168 hours. CBC: Recent Labs  Lab 02/14/21 1457  02/17/21 0938  WBC 8.2 5.8  NEUTROABS 4.8  --   HGB 15.0 12.7  HCT 46.6* 39.6  MCV 98.9 100.3*  PLT 174 141*   Cardiac Enzymes: No results for input(s): CKTOTAL, CKMB, CKMBINDEX, TROPONINI in the last 168 hours. BNP (last 3 results) Recent Labs    09/18/20 0925 02/14/21 1457  BNP 20.1 61.9    ProBNP (last 3 results) No results for input(s): PROBNP in the last 8760 hours.  CBG: Recent Labs  Lab 02/16/21 0755 02/16/21 1128 02/16/21 1701 02/16/21 1955 02/17/21 0812  GLUCAP 103* 128* 116* 132* 85       Radiology Reports  No results found.     Antibiotics: Anti-infectives (From admission, onward)    Start     Dose/Rate Route Frequency Ordered Stop   02/16/21 1330  cefTRIAXone (ROCEPHIN) 1 g in sodium chloride 0.9 % 100 mL IVPB        1 g 200 mL/hr over 30 Minutes Intravenous Every 24 hours 02/16/21 1239           DVT prophylaxis: Patient is on full dose apixaban  Code Status: DNR  Family Communication: No family at bedside   Consultants:   Procedures:     Objective    Physical Examination:  General-appears in no acute distress Heart-S1-S2, regular, no murmur auscultated Lungs-clear to auscultation bilaterally, no wheezing or crackles auscultated Abdomen-soft, nontender, no organomegaly Extremities-no edema in the lower extremities Neuro-alert, oriented x3, left upper extremity contracture  Status is: Inpatient  Dispo: The patient is from: Skilled nursing facility              Anticipated d/c is to: Skilled nursing facility              Anticipated d/c date is: 02/17/2021              Patient currently not stable for discharge  Barrier to discharge-ongoing evaluation for respiratory distress  COVID-19 Labs  No results for input(s): DDIMER, FERRITIN, LDH, CRP in the last 72 hours.  Lab Results  Component Value Date   SARSCOV2NAA NEGATIVE 02/14/2021   SARSCOV2NAA NEGATIVE 09/22/2020   SARSCOV2NAA NEGATIVE 09/18/2020      Pressure Injury 02/15/21 Coccyx Mid Stage 3 -  Full thickness tissue loss. Subcutaneous fat may be visible but bone, tendon or muscle are NOT exposed. (Active)  02/15/21 1829  Location: Coccyx  Location Orientation: Mid  Staging: Stage 3 -  Full thickness tissue loss. Subcutaneous fat may be visible but bone, tendon or muscle are NOT exposed.  Wound Description (Comments):   Present on Admission: Yes        Recent Results (from the past 240 hour(s))  Blood Culture (routine x 2)     Status: None (Preliminary result)   Collection  Time: 02/14/21  2:57 PM   Specimen: BLOOD  Result Value Ref Range Status   Specimen Description BLOOD BLOOD RIGHT HAND  Final   Special Requests   Final    Blood Culture results may not be optimal due to an excessive volume of blood received in culture bottles   Culture   Final    NO GROWTH 3 DAYS Performed at [facility], [address] [place], [place] [address]    Report Status PENDING  Incomplete  Blood Culture (routine x 2)     Status: None (Preliminary result)   Collection Time: 02/14/21  3:02 PM   Specimen: BLOOD  Result Value Ref Range Status   Specimen Description BLOOD BLOOD LEFT WRIST  Final   Special Requests   Final    Blood Culture results may not be optimal due to an excessive volume of blood received in culture bottles   Culture   Final    NO GROWTH 3 DAYS Performed at [facility], [address], [place], [place] [address]    Report Status PENDING  Incomplete  Resp Panel by RT-PCR (Flu A&B, Covid) Nasopharyngeal Swab     Status: None   Collection Time: 02/14/21  3:22 PM   Specimen: Nasopharyngeal Swab; Nasopharyngeal(NP) swabs in vial transport medium  Result Value Ref Range Status   SARS Coronavirus 2 by RT PCR NEGATIVE NEGATIVE Final    Comment: (NOTE) SARS-CoV-2 target nucleic acids are NOT DETECTED.  The SARS-CoV-2 RNA is generally detectable in upper respiratory specimens during the acute phase  of infection. The lowest concentration of SARS-CoV-2 viral copies this assay can detect is 138 copies/mL. A negative result does not preclude SARS-Cov-2 infection and should not be used as the sole basis for treatment or other patient management decisions. A negative result may occur with  improper specimen collection/handling, submission of specimen other than nasopharyngeal swab, presence of viral mutation(s) within the areas targeted by this assay, and inadequate number of viral copies(<138 copies/mL). A negative result must be combined with clinical observations, patient history, and epidemiological information. The expected result is Negative.  Fact Sheet for Patients:  BloggerCourse.com  Fact Sheet for Healthcare Providers:  SeriousBroker.it  This test is no t yet approved or cleared by the [place] FDA and  has been authorized for detection and/or diagnosis of SARS-CoV-2 by FDA under an Emergency Use Authorization (EUA). This EUA will remain  in effect (meaning this test can be used) for the duration of the COVID-19 declaration under Section 564(b)(1) of the Act, 21 U.S.C.section 360bbb-3(b)(1), unless the authorization is terminated  or revoked sooner.       Influenza A by PCR NEGATIVE NEGATIVE Final   Influenza B by PCR NEGATIVE NEGATIVE Final    Comment: (NOTE) The Xpert Xpress SARS-CoV-2/FLU/RSV plus assay is intended as an aid in the diagnosis of influenza from Nasopharyngeal swab specimens and should not be used as a sole basis for treatment. Nasal washings and aspirates are unacceptable for Xpert Xpress SARS-CoV-2/FLU/RSV testing.  Fact Sheet for Patients: BloggerCourse.com  Fact Sheet for Healthcare Providers: SeriousBroker.it  This test is not yet approved or cleared by the [place] FDA and has been authorized for detection and/or diagnosis of  SARS-CoV-2 by FDA under an Emergency Use Authorization (EUA). This EUA will remain in effect (meaning this test can be used) for the duration of the COVID-19 declaration under Section 564(b)(1) of the Act, 21 U.S.C. section 360bbb-3(b)(1), unless the authorization is terminated or  revoked.  Performed at [facility], [address] [place], [place] [address]   MRSA Next Gen by PCR, Nasal     Status: None   Collection Time: 02/15/21 10:11 PM   Specimen: Nasal Mucosa; Nasal Swab  Result Value Ref Range Status   MRSA by PCR Next Gen NOT DETECTED NOT DETECTED Final    Comment: (NOTE) The GeneXpert MRSA Assay (FDA approved for NASAL specimens only), is one component of a comprehensive MRSA colonization surveillance program. It is not intended to diagnose MRSA infection nor to guide or monitor treatment for MRSA infections. Test performance is not FDA approved in patients less than 70 years old. Performed at [facility], [address] [place], [place] [address]   Urine Culture     Status: Abnormal   Collection Time: 02/16/21 11:12 AM   Specimen: In/Out Cath Urine  Result Value Ref Range Status   Specimen Description   Final    IN/OUT CATH URINE Performed at [facility], [address] [place], [place] [address]    Special Requests   Final    NONE Performed at [facility], [address] [place], [place] [address]    Culture MULTIPLE SPECIES PRESENT, SUGGEST RECOLLECTION (A)  Final   Report Status 02/17/2021 FINAL  Final    Meredeth IdeGagan S Krista Godsil   [facility] If 7PM-7AM, please contact night-coverage at [email or web address], Office  [phone]   02/17/2021, 12:37 PM  LOS: 3 days            Dyspnea

## 2021-02-18 DIAGNOSIS — R0603 Acute respiratory distress: Secondary | ICD-10-CM | POA: Diagnosis not present

## 2021-02-18 LAB — SARS CORONAVIRUS 2 (TAT 6-24 HRS): SARS Coronavirus 2: NEGATIVE

## 2021-02-18 LAB — GLUCOSE, CAPILLARY
Glucose-Capillary: 76 mg/dL (ref 70–99)
Glucose-Capillary: 116 mg/dL — ABNORMAL HIGH (ref 70–99)

## 2021-02-18 MED ORDER — GUAIFENESIN ER 600 MG PO TB12: 1200.0000 mg | ORAL_TABLET | Freq: Two times a day (BID) | ORAL | 0 refills | Status: DC

## 2021-02-18 MED ORDER — CLONAZEPAM 0.5 MG PO TABS: 0.5000 mg | ORAL_TABLET | Freq: Two times a day (BID) | ORAL | 0 refills | Status: DC

## 2021-02-18 MED ORDER — CEPHALEXIN 500 MG PO CAPS: 500.0000 mg | ORAL_CAPSULE | Freq: Three times a day (TID) | ORAL | 0 refills | Status: AC

## 2021-02-18 MED ORDER — OXYCODONE-ACETAMINOPHEN 5-325 MG PO TABS: 1.0000 | ORAL_TABLET | Freq: Three times a day (TID) | ORAL | 0 refills | Status: AC

## 2021-02-18 NOTE — Discharge Summary (Signed)
Physician Discharge Summary  Tammy Shepard RUE:[id number] DOB: 11-27-1950 DOA: 02/14/2021  PCP: Lauro RegulusAnderson, Marshall W, MD  Admit date: 02/14/2021 Discharge date: 02/18/2021  Admitted From: SNF Disposition:  SNF  Recommendations for Outpatient Follow-up:  Follow up with PCP in 1-2 weeks Please obtain BMP/CBC in one week Hold sedative as needed.  Encourage oral intake.   Discharge Condition: Stable.  CODE STATUS: DNR Diet recommendation: Carb Modified  Brief/Interim Summary: 71 year old resident of the skilled nursing facility presented with dyspnea and hypoxemia with tachypnea.  She has a past medical history of type 2 diabetes, hypertension, hyperlipidemia, CVA, seizure, asthma, vascular dementia, hypoxic respiratory failure, DVT.  Patient is on chronic 4 L of oxygen at baseline.  Chest x-ray was clear without consolidation.  Mental status improved after she was a started on IV antibiotics and IV fluids.   1-Respiratory distress, acute on chronic hypoxic respiratory failure: Patient on chronic 4 L of oxygen, at rehab she was saturating 70% on 4 L. VQ scan was very low probability for PE.  Chest x-ray was negative. Her oxygen requirement has improved and she is back to 4 L of oxygen Received antibiotics, and treated for bronchitis. Also decreased mental status could have cause hypoxemia and hypoventilation   2-Acute metabolic encephalopathy Patient presented lethargic Treated for UTI, although urine culture only grew with multiple species.  Will complete 2 more days of antibiotics for UTI She also improved with IV fluids. Will need to be cautious with  sedative  3-Elevation of troponin Denies chest pain, EKG unremarkable.  Likely demand ischemia  4-Seizure disorder: Continue with valproic acid  Hypertension: Continue with metoprolol  Diabetes type 2: Continue with sliding scale  DVT: Continue with Eliquis Hypothyroidism: Continue with Synthroid  History of CVA and sacral  decubitus ulcer present on admission: Clean with wound care    Discharge Diagnoses:  Principal Problem:   Respiratory distress Active Problems:   H/O: CVA (cerebrovascular accident)   Essential hypertension, benign   Hypothyroid   Sacral ulcer (HCC)   Type 2 diabetes mellitus with complication, without long-term current use of insulin (HCC)   DVT (deep venous thrombosis) (HCC)   Seizure (HCC)    Discharge Instructions  Discharge Instructions     Diet - low sodium heart healthy   Complete by: As directed    Discharge wound care:   Complete by: As directed    See above   Increase activity slowly   Complete by: As directed       Allergies as of 02/18/2021       Reactions   Amoxicillin Rash   Tolerating rocephin 10/29/15   Ciprofloxacin Anaphylaxis   Clindamycin Hcl Swelling, Rash, Other (See Comments)   Other Reaction: BAD RASH AND SWELLING, SKIN BR Severe rash and swelling. Skin break down.   Penicillins Anaphylaxis   Tolerating rocephin 11/01/15   Codeine Nausea And Vomiting   Baclofen Itching, Rash   Contrast Media [iodinated Contrast Media] Itching, Rash   Erythromycin Rash   Iodides Rash   Povidone iodine; betadine   Latex Itching, Rash   Nickel Rash, Other (See Comments)   OTHER REACTION   Soap Itching, Rash   Sulfa Antibiotics Rash   Tape Rash   (silk tape only) skin break down   Tolterodine Rash   Vancomycin Rash        Medication List     STOP taking these medications    furosemide 20 MG tablet Commonly known as: LASIX  TAKE these medications    apixaban 5 MG Tabs tablet Commonly known as: ELIQUIS Take 1 tablet (5 mg total) by mouth 2 (two) times daily.   ARIPiprazole 5 MG tablet Commonly known as: ABILIFY Take 5 mg by mouth daily.   atorvastatin 40 MG tablet Commonly known as: LIPITOR Take 40 mg by mouth every morning.   cephALEXin 500 MG capsule Commonly known as: KEFLEX Take 1 capsule (500 mg total) by mouth 3 (three)  times daily for 2 days.   clonazePAM 0.5 MG tablet Commonly known as: KLONOPIN Take 0.5 mg by mouth 2 (two) times daily.   divalproex 125 MG capsule Commonly known as: DEPAKOTE SPRINKLE Take 500 mg by mouth 2 (two) times daily.   FLUoxetine 40 MG capsule Commonly known as: PROZAC Take 40 mg by mouth daily.   guaiFENesin 600 MG 12 hr tablet Commonly known as: MUCINEX Take 2 tablets (1,200 mg total) by mouth 2 (two) times daily.   levothyroxine 150 MCG tablet Commonly known as: SYNTHROID Take 150 mcg by mouth daily before breakfast.   metoprolol tartrate 25 MG tablet Commonly known as: LOPRESSOR Take 25 mg by mouth 2 (two) times daily.   multivitamin with minerals Tabs tablet Take 1 tablet by mouth daily.   neomycin-bacitracin-polymyxin ointment Commonly known as: NEOSPORIN Apply 1 application topically daily. (Apply to toe wound)   nitroGLYCERIN 0.4 MG SL tablet Commonly known as: NITROSTAT Place 0.4 mg under the tongue every 5 (five) minutes as needed for chest pain.   omeprazole 10 MG capsule Commonly known as: PRILOSEC Take 10 mg by mouth daily.   oxyCODONE-acetaminophen 5-325 MG tablet Commonly known as: PERCOCET/ROXICET Take 1 tablet by mouth 3 (three) times daily.   polyethylene glycol 17 g packet Commonly known as: MIRALAX / GLYCOLAX Take 17 g by mouth at bedtime.   REFRESH P.M. OP Apply 1 inch to eye at bedtime as needed (dry eyes).   risperiDONE 0.5 MG tablet Commonly known as: RISPERDAL Take 0.5 mg by mouth at bedtime.   senna 8.6 MG tablet Commonly known as: SENOKOT Take 2 tablets by mouth 2 (two) times daily as needed for constipation.               Discharge Care Instructions  (From admission, onward)           Start     Ordered   02/18/21 0000  Discharge wound care:       Comments: See above   02/18/21 1115            Contact information for after-discharge care     Destination     [facility] Preferred SNF .   Service: Skilled Nursing Contact information: [address] [place] [address] [phone]                    Allergies  Allergen Reactions   Amoxicillin Rash    Tolerating rocephin 10/29/15   Ciprofloxacin Anaphylaxis   Clindamycin Hcl Swelling, Rash and Other (See Comments)    Other Reaction: BAD RASH AND SWELLING, SKIN BR Severe rash and swelling. Skin break down.   Penicillins Anaphylaxis    Tolerating rocephin 11/01/15   Codeine Nausea And Vomiting   Baclofen Itching and Rash   Contrast Media [Iodinated Contrast Media] Itching and Rash   Erythromycin Rash   Iodides Rash    Povidone iodine; betadine   Latex Itching and  Rash   Nickel Rash and Other (See Comments)    OTHER REACTION   Soap Itching and Rash   Sulfa Antibiotics Rash   Tape Rash    (silk tape only) skin break down   Tolterodine Rash   Vancomycin Rash    Consultations: None   Procedures/Studies: NM Pulmonary Perfusion  Result Date: 02/15/2021 CLINICAL DATA:  PE suspected EXAM: NUCLEAR MEDICINE PERFUSION LUNG SCAN TECHNIQUE: Perfusion images were obtained in multiple projections after intravenous injection of radiopharmaceutical. Ventilation scans intentionally deferred if perfusion scan and chest x-ray adequate for interpretation during COVID 19 epidemic. RADIOPHARMACEUTICALS:  4.01 mCi Tc-14m MAA IV COMPARISON:  Chest radiograph, 02/14/2021 FINDINGS: Normal, homogeneous bilateral pulmonary perfusion. No suspicious perfusion defects. IMPRESSION: Very low probability examination for pulmonary embolism by modified perfusion only PIOPED criteria (PE absent). Electronically Signed   By: Jearld Lesch M.D.   On: 02/15/2021 12:29   DG Chest Port 1 View  Result Date: 02/14/2021 CLINICAL DATA:  Possible sepsis EXAM: PORTABLE CHEST 1 VIEW COMPARISON:  Prior chest x-ray 09/18/2020 FINDINGS: Portable frontal view of the  chest. The patient is slightly rotated toward the right. Given rotated position, the cardiac and mediastinal contours remain within normal limits. Atherosclerotic calcification is present in the transverse aorta. No focal airspace infiltrate, effusion or pneumothorax. No acute osseous abnormality. IMPRESSION: No active disease. Electronically Signed   By: Malachy Moan M.D.   On: 02/14/2021 16:03     Subjective: She is alert, report cough better. No new complaints.   Discharge Exam: Vitals:   02/18/21 0459 02/18/21 0856  BP: (!) 121/56 (!) 133/51  Pulse: (!) 56 66  Resp: 16 18  Temp: (!) 97.4 F (36.3 C) 97.7 F (36.5 C)  SpO2: 100% 100%     General: Pt is alert, awake, not in acute distress Cardiovascular: RRR, S1/S2 +, no rubs, no gallops Respiratory: CTA bilaterally, no wheezing, no rhonchi Abdominal: Soft, NT, ND, bowel sounds + Extremities: no edema, no cyanosis    The results of significant diagnostics from this hospitalization (including imaging, microbiology, ancillary and laboratory) are listed below for reference.     Microbiology: Recent Results (from the past 240 hour(s))  Blood Culture (routine x 2)     Status: None (Preliminary result)   Collection Time: 02/14/21  2:57 PM   Specimen: BLOOD  Result Value Ref Range Status   Specimen Description BLOOD BLOOD RIGHT HAND  Final   Special Requests   Final    Blood Culture results may not be optimal due to an excessive volume of blood received in culture bottles   Culture   Final    NO GROWTH 4 DAYS Performed at [facility], [address] [place], [place] [address]    Report Status PENDING  Incomplete  Blood Culture (routine x 2)     Status: None (Preliminary result)   Collection Time: 02/14/21  3:02 PM   Specimen: BLOOD  Result Value Ref Range Status   Specimen Description BLOOD BLOOD LEFT WRIST  Final   Special Requests   Final    Blood Culture results may not be optimal due to an excessive  volume of blood received in culture bottles   Culture   Final    NO GROWTH 4 DAYS Performed at [facility], [address] [place], [place] [address]    Report Status PENDING  Incomplete  Resp Panel by RT-PCR (Flu A&B, Covid) Nasopharyngeal Swab     Status: None  Collection Time: 02/14/21  3:22 PM   Specimen: Nasopharyngeal Swab; Nasopharyngeal(NP) swabs in vial transport medium  Result Value Ref Range Status   SARS Coronavirus 2 by RT PCR NEGATIVE NEGATIVE Final    Comment: (NOTE) SARS-CoV-2 target nucleic acids are NOT DETECTED.  The SARS-CoV-2 RNA is generally detectable in upper respiratory specimens during the acute phase of infection. The lowest concentration of SARS-CoV-2 viral copies this assay can detect is 138 copies/mL. A negative result does not preclude SARS-Cov-2 infection and should not be used as the sole basis for treatment or other patient management decisions. A negative result may occur with  improper specimen collection/handling, submission of specimen other than nasopharyngeal swab, presence of viral mutation(s) within the areas targeted by this assay, and inadequate number of viral copies(<138 copies/mL). A negative result must be combined with clinical observations, patient history, and epidemiological information. The expected result is Negative.  Fact Sheet for Patients:  BloggerCourse.com  Fact Sheet for Healthcare Providers:  SeriousBroker.it  This test is no t yet approved or cleared by the [place] FDA and  has been authorized for detection and/or diagnosis of SARS-CoV-2 by FDA under an Emergency Use Authorization (EUA). This EUA will remain  in effect (meaning this test can be used) for the duration of the COVID-19 declaration under Section 564(b)(1) of the Act, 21 U.S.C.section 360bbb-3(b)(1), unless the authorization is terminated  or revoked sooner.       Influenza A by PCR  NEGATIVE NEGATIVE Final   Influenza B by PCR NEGATIVE NEGATIVE Final    Comment: (NOTE) The Xpert Xpress SARS-CoV-2/FLU/RSV plus assay is intended as an aid in the diagnosis of influenza from Nasopharyngeal swab specimens and should not be used as a sole basis for treatment. Nasal washings and aspirates are unacceptable for Xpert Xpress SARS-CoV-2/FLU/RSV testing.  Fact Sheet for Patients: BloggerCourse.com  Fact Sheet for Healthcare Providers: SeriousBroker.it  This test is not yet approved or cleared by the [place] FDA and has been authorized for detection and/or diagnosis of SARS-CoV-2 by FDA under an Emergency Use Authorization (EUA). This EUA will remain in effect (meaning this test can be used) for the duration of the COVID-19 declaration under Section 564(b)(1) of the Act, 21 U.S.C. section 360bbb-3(b)(1), unless the authorization is terminated or revoked.  Performed at [facility], [address] [place], [place] [address]   MRSA Next Gen by PCR, Nasal     Status: None   Collection Time: 02/15/21 10:11 PM   Specimen: Nasal Mucosa; Nasal Swab  Result Value Ref Range Status   MRSA by PCR Next Gen NOT DETECTED NOT DETECTED Final    Comment: (NOTE) The GeneXpert MRSA Assay (FDA approved for NASAL specimens only), is one component of a comprehensive MRSA colonization surveillance program. It is not intended to diagnose MRSA infection nor to guide or monitor treatment for MRSA infections. Test performance is not FDA approved in patients less than 67 years old. Performed at [facility], [address], [place], [place] [address]   Urine Culture     Status: Abnormal   Collection Time: 02/16/21 11:12 AM   Specimen: In/Out Cath Urine  Result Value Ref Range Status   Specimen Description   Final    IN/OUT CATH URINE Performed at [facility], [address], [place], [place] [address]     Special Requests   Final    NONE Performed at [facility], [address] [place], [place] [address]  Culture MULTIPLE SPECIES PRESENT, SUGGEST RECOLLECTION (A)  Final   Report Status 02/17/2021 FINAL  Final  SARS CORONAVIRUS 2 (TAT 6-24 HRS) Nasopharyngeal Nasopharyngeal Swab     Status: None   Collection Time: 02/17/21  4:12 PM   Specimen: Nasopharyngeal Swab  Result Value Ref Range Status   SARS Coronavirus 2 NEGATIVE NEGATIVE Final    Comment: (NOTE) SARS-CoV-2 target nucleic acids are NOT DETECTED.  The SARS-CoV-2 RNA is generally detectable in upper and lower respiratory specimens during the acute phase of infection. Negative results do not preclude SARS-CoV-2 infection, do not rule out co-infections with other pathogens, and should not be used as the sole basis for treatment or other patient management decisions. Negative results must be combined with clinical observations, patient history, and epidemiological information. The expected result is Negative.  Fact Sheet for Patients: HairSlick.no  Fact Sheet for Healthcare Providers: quierodirigir.com  This test is not yet approved or cleared by the [place] FDA and  has been authorized for detection and/or diagnosis of SARS-CoV-2 by FDA under an Emergency Use Authorization (EUA). This EUA will remain  in effect (meaning this test can be used) for the duration of the COVID-19 declaration under Se ction 564(b)(1) of the Act, 21 U.S.C. section 360bbb-3(b)(1), unless the authorization is terminated or revoked sooner.  Performed at [facility], [address], [place], [place] [address]      Labs: BNP (last 3 results) Recent Labs    09/18/20 0925 02/14/21 1457  BNP 20.1 61.9   Basic Metabolic Panel: Recent Labs  Lab 02/14/21 1457 02/17/21 0938  NA 140 144  K 3.9 4.2  CL 107 108  CO2 23 29  GLUCOSE 143* 95  BUN 22 22   CREATININE 0.73 0.61  CALCIUM 8.8* 8.3*   Liver Function Tests: Recent Labs  Lab 02/14/21 1457 02/17/21 0938  AST 17 10*  ALT 13 10  ALKPHOS 54 49  BILITOT 0.6 0.3  PROT 7.1 6.0*  ALBUMIN 3.2* 2.8*   No results for input(s): LIPASE, AMYLASE in the last 168 hours. No results for input(s): AMMONIA in the last 168 hours. CBC: Recent Labs  Lab 02/14/21 1457 02/17/21 0938  WBC 8.2 5.8  NEUTROABS 4.8  --   HGB 15.0 12.7  HCT 46.6* 39.6  MCV 98.9 100.3*  PLT 174 141*   Cardiac Enzymes: No results for input(s): CKTOTAL, CKMB, CKMBINDEX, TROPONINI in the last 168 hours. BNP: Invalid input(s): POCBNP CBG: Recent Labs  Lab 02/17/21 0812 02/17/21 1221 02/17/21 1630 02/17/21 2136 02/18/21 0858  GLUCAP 85 116* 141* 161* 76   D-Dimer No results for input(s): DDIMER in the last 72 hours. Hgb A1c No results for input(s): HGBA1C in the last 72 hours. Lipid Profile No results for input(s): CHOL, HDL, LDLCALC, TRIG, CHOLHDL, LDLDIRECT in the last 72 hours. Thyroid function studies No results for input(s): TSH, T4TOTAL, T3FREE, THYROIDAB in the last 72 hours.  Invalid input(s): FREET3 Anemia work up No results for input(s): VITAMINB12, FOLATE, FERRITIN, TIBC, IRON, RETICCTPCT in the last 72 hours. Urinalysis    Component Value Date/Time   COLORURINE AMBER (A) 02/14/2021 1220   APPEARANCEUR CLOUDY (A) 02/14/2021 1220   APPEARANCEUR Cloudy 08/18/2011 1018   LABSPEC 1.027 02/14/2021 1220   LABSPEC 1.017 08/18/2011 1018   PHURINE 5.0 02/14/2021 1220   GLUCOSEU NEGATIVE 02/14/2021 1220   GLUCOSEU Negative 08/18/2011 1018   HGBUR MODERATE (A) 02/14/2021 1220   BILIRUBINUR NEGATIVE 02/14/2021 1220   BILIRUBINUR Negative  08/18/2011 1018   KETONESUR NEGATIVE 02/14/2021 1220   PROTEINUR 30 (A) 02/14/2021 1220   NITRITE POSITIVE (A) 02/14/2021 1220   LEUKOCYTESUR MODERATE (A) 02/14/2021 1220   LEUKOCYTESUR 3+ 08/18/2011 1018   Sepsis Labs Invalid input(s): PROCALCITONIN,   WBC,  LACTICIDVEN Microbiology Recent Results (from the past 240 hour(s))  Blood Culture (routine x 2)     Status: None (Preliminary result)   Collection Time: 02/14/21  2:57 PM   Specimen: BLOOD  Result Value Ref Range Status   Specimen Description BLOOD BLOOD RIGHT HAND  Final   Special Requests   Final    Blood Culture results may not be optimal due to an excessive volume of blood received in culture bottles   Culture   Final    NO GROWTH 4 DAYS Performed at [facility], [address] [place], [place] [address]    Report Status PENDING  Incomplete  Blood Culture (routine x 2)     Status: None (Preliminary result)   Collection Time: 02/14/21  3:02 PM   Specimen: BLOOD  Result Value Ref Range Status   Specimen Description BLOOD BLOOD LEFT WRIST  Final   Special Requests   Final    Blood Culture results may not be optimal due to an excessive volume of blood received in culture bottles   Culture   Final    NO GROWTH 4 DAYS Performed at [facility], [address] [place], [place] [address]    Report Status PENDING  Incomplete  Resp Panel by RT-PCR (Flu A&B, Covid) Nasopharyngeal Swab     Status: None   Collection Time: 02/14/21  3:22 PM   Specimen: Nasopharyngeal Swab; Nasopharyngeal(NP) swabs in vial transport medium  Result Value Ref Range Status   SARS Coronavirus 2 by RT PCR NEGATIVE NEGATIVE Final    Comment: (NOTE) SARS-CoV-2 target nucleic acids are NOT DETECTED.  The SARS-CoV-2 RNA is generally detectable in upper respiratory specimens during the acute phase of infection. The lowest concentration of SARS-CoV-2 viral copies this assay can detect is 138 copies/mL. A negative result does not preclude SARS-Cov-2 infection and should not be used as the sole basis for treatment or other patient management decisions. A negative result may occur with  improper specimen collection/handling, submission of specimen other than nasopharyngeal swab,  presence of viral mutation(s) within the areas targeted by this assay, and inadequate number of viral copies(<138 copies/mL). A negative result must be combined with clinical observations, patient history, and epidemiological information. The expected result is Negative.  Fact Sheet for Patients:  BloggerCourse.comhttps://www.fda.gov/media/152166/download  Fact Sheet for Healthcare Providers:  SeriousBroker.ithttps://www.fda.gov/media/152162/download  This test is no t yet approved or cleared by the [place] FDA and  has been authorized for detection and/or diagnosis of SARS-CoV-2 by FDA under an Emergency Use Authorization (EUA). This EUA will remain  in effect (meaning this test can be used) for the duration of the COVID-19 declaration under Section 564(b)(1) of the Act, 21 U.S.C.section 360bbb-3(b)(1), unless the authorization is terminated  or revoked sooner.       Influenza A by PCR NEGATIVE NEGATIVE Final   Influenza B by PCR NEGATIVE NEGATIVE Final    Comment: (NOTE) The Xpert Xpress SARS-CoV-2/FLU/RSV plus assay is intended as an aid in the diagnosis of influenza from Nasopharyngeal swab specimens and should not be used as a sole basis for treatment. Nasal washings and aspirates are unacceptable for Xpert Xpress SARS-CoV-2/FLU/RSV testing.  Fact Sheet for Patients: BloggerCourse.comhttps://www.fda.gov/media/152166/download  Fact Sheet  for Healthcare Providers: SeriousBroker.it  This test is not yet approved or cleared by the [place] and has been authorized for detection and/or diagnosis of SARS-CoV-2 by FDA under an Emergency Use Authorization (EUA). This EUA will remain in effect (meaning this test can be used) for the duration of the COVID-19 declaration under Section 564(b)(1) of the Act, 21 U.S.C. section 360bbb-3(b)(1), unless the authorization is terminated or revoked.  Performed at [facility], [address] [place], [place] [address]   MRSA Next Gen  by PCR, Nasal     Status: None   Collection Time: 02/15/21 10:11 PM   Specimen: Nasal Mucosa; Nasal Swab  Result Value Ref Range Status   MRSA by PCR Next Gen NOT DETECTED NOT DETECTED Final    Comment: (NOTE) The GeneXpert MRSA Assay (FDA approved for NASAL specimens only), is one component of a comprehensive MRSA colonization surveillance program. It is not intended to diagnose MRSA infection nor to guide or monitor treatment for MRSA infections. Test performance is not FDA approved in patients less than 45 years old. Performed at [facility], [address] [place], [place] [address]   Urine Culture     Status: Abnormal   Collection Time: 02/16/21 11:12 AM   Specimen: In/Out Cath Urine  Result Value Ref Range Status   Specimen Description   Final    IN/OUT CATH URINE Performed at [facility], [address], [place], [place] [address]    Special Requests   Final    NONE Performed at [facility], [address] [place], [place] [address]    Culture MULTIPLE SPECIES PRESENT, SUGGEST RECOLLECTION (A)  Final   Report Status 02/17/2021 FINAL  Final  SARS CORONAVIRUS 2 (TAT 6-24 HRS) Nasopharyngeal Nasopharyngeal Swab     Status: None   Collection Time: 02/17/21  4:12 PM   Specimen: Nasopharyngeal Swab  Result Value Ref Range Status   SARS Coronavirus 2 NEGATIVE NEGATIVE Final    Comment: (NOTE) SARS-CoV-2 target nucleic acids are NOT DETECTED.  The SARS-CoV-2 RNA is generally detectable in upper and lower respiratory specimens during the acute phase of infection. Negative results do not preclude SARS-CoV-2 infection, do not rule out co-infections with other pathogens, and should not be used as the sole basis for treatment or other patient management decisions. Negative results must be combined with clinical observations, patient history, and epidemiological information. The expected result is Negative.  Fact Sheet for  Patients: HairSlick.no  Fact Sheet for Healthcare Providers: quierodirigir.com  This test is not yet approved or cleared by the [place] FDA and  has been authorized for detection and/or diagnosis of SARS-CoV-2 by FDA under an Emergency Use Authorization (EUA). This EUA will remain  in effect (meaning this test can be used) for the duration of the COVID-19 declaration under Se ction 564(b)(1) of the Act, 21 U.S.C. section 360bbb-3(b)(1), unless the authorization is terminated or revoked sooner.  Performed at [facility], [address] [place], [place] [address]      Time coordinating discharge: 40 minutes  SIGNED:   Alba Cory, MD  [facility]

## 2021-02-18 NOTE — TOC Transition Note (Signed)
Transition of Care [facility]) - CM/SW Discharge Note   Patient Details  Name: Tammy Shepard MRN: [id number] Date of Birth: 06/22/1950  Transition of Care [facility]) CM/SW Contact:  Candie Chroman, LCSW Phone Number: 02/18/2021, 12:13 PM   Clinical Narrative:   Patient has orders to discharge back to [facility] today. RN will call report to [phone] (Room 102B). EMS transport has been arranged. No further concerns. CSW signing off.  Final next level of care: [place] Barriers to Discharge: Barriers Resolved   Patient Goals and CMS Choice     Choice offered to / list presented to : NA  Discharge Placement   Existing PASRR number confirmed : 02/16/21          Patient chooses bed at: [facility] Patient to be transferred to facility by: EMS Name of family member notified: Nigel Sloop, Theresia Majors Patient and family notified of of transfer: 02/18/21  Discharge Plan and Services     Post Acute Care Choice: Resumption of Svcs/PTA Provider                               Social Determinants of Health (SDOH) Interventions     Readmission Risk Interventions No flowsheet data found.

## 2021-02-18 NOTE — Care Management Important Message (Signed)
Important Message  Patient Details  Name: Tammy Shepard MRN: [id number] Date of Birth: 11-21-1950   Medicare Important Message Given:  N/A - LOS <3 / Initial given by admissions     [name] A Samik Balkcom 02/18/2021, 12:28 PM

## 2021-02-19 LAB — CULTURE, BLOOD (ROUTINE X 2)
Culture: NO GROWTH
Culture: NO GROWTH

## 2021-04-01 ENCOUNTER — Inpatient Hospital Stay
Admission: EM | Admit: 2021-04-01 | Discharge: 2021-04-03 | DRG: 193 | Disposition: A | Discharge status: Skilled Nursing Facility | Payer: Medicare (Managed Care) | Attending: Internal Medicine | Admitting: Internal Medicine

## 2021-04-01 ENCOUNTER — Emergency Department: Payer: Medicare (Managed Care)

## 2021-04-01 ENCOUNTER — Other Ambulatory Visit: Payer: Self-pay

## 2021-04-01 DIAGNOSIS — G40909 Epilepsy, unspecified, not intractable, without status epilepticus: Secondary | ICD-10-CM | POA: Diagnosis present

## 2021-04-01 DIAGNOSIS — Z881 Allergy status to other antibiotic agents status: Secondary | ICD-10-CM

## 2021-04-01 DIAGNOSIS — I69354 Hemiplegia and hemiparesis following cerebral infarction affecting left non-dominant side: Secondary | ICD-10-CM

## 2021-04-01 DIAGNOSIS — Z880 Allergy status to penicillin: Secondary | ICD-10-CM

## 2021-04-01 DIAGNOSIS — Z87891 Personal history of nicotine dependence: Secondary | ICD-10-CM

## 2021-04-01 DIAGNOSIS — E876 Hypokalemia: Secondary | ICD-10-CM | POA: Diagnosis present

## 2021-04-01 DIAGNOSIS — R0602 Shortness of breath: Secondary | ICD-10-CM | POA: Diagnosis not present

## 2021-04-01 DIAGNOSIS — I10 Essential (primary) hypertension: Secondary | ICD-10-CM | POA: Diagnosis present

## 2021-04-01 DIAGNOSIS — Y95 Nosocomial condition: Secondary | ICD-10-CM | POA: Diagnosis present

## 2021-04-01 DIAGNOSIS — I5032 Chronic diastolic (congestive) heart failure: Secondary | ICD-10-CM | POA: Diagnosis present

## 2021-04-01 DIAGNOSIS — E119 Type 2 diabetes mellitus without complications: Secondary | ICD-10-CM | POA: Diagnosis present

## 2021-04-01 DIAGNOSIS — Z79890 Hormone replacement therapy (postmenopausal): Secondary | ICD-10-CM

## 2021-04-01 DIAGNOSIS — J189 Pneumonia, unspecified organism: Secondary | ICD-10-CM | POA: Diagnosis not present

## 2021-04-01 DIAGNOSIS — Z91041 Radiographic dye allergy status: Secondary | ICD-10-CM

## 2021-04-01 DIAGNOSIS — Z20822 Contact with and (suspected) exposure to covid-19: Secondary | ICD-10-CM | POA: Diagnosis present

## 2021-04-01 DIAGNOSIS — I110 Hypertensive heart disease with heart failure: Secondary | ICD-10-CM | POA: Diagnosis present

## 2021-04-01 DIAGNOSIS — Z885 Allergy status to narcotic agent status: Secondary | ICD-10-CM

## 2021-04-01 DIAGNOSIS — G9341 Metabolic encephalopathy: Secondary | ICD-10-CM | POA: Diagnosis present

## 2021-04-01 DIAGNOSIS — Z66 Do not resuscitate: Secondary | ICD-10-CM | POA: Diagnosis present

## 2021-04-01 DIAGNOSIS — Z79899 Other long term (current) drug therapy: Secondary | ICD-10-CM

## 2021-04-01 DIAGNOSIS — Z9981 Dependence on supplemental oxygen: Secondary | ICD-10-CM

## 2021-04-01 DIAGNOSIS — Z7901 Long term (current) use of anticoagulants: Secondary | ICD-10-CM

## 2021-04-01 DIAGNOSIS — E785 Hyperlipidemia, unspecified: Secondary | ICD-10-CM | POA: Diagnosis present

## 2021-04-01 DIAGNOSIS — J9611 Chronic respiratory failure with hypoxia: Secondary | ICD-10-CM | POA: Diagnosis present

## 2021-04-01 DIAGNOSIS — Z91048 Other nonmedicinal substance allergy status: Secondary | ICD-10-CM

## 2021-04-01 DIAGNOSIS — Z91040 Latex allergy status: Secondary | ICD-10-CM

## 2021-04-01 DIAGNOSIS — Z882 Allergy status to sulfonamides status: Secondary | ICD-10-CM

## 2021-04-01 DIAGNOSIS — Z888 Allergy status to other drugs, medicaments and biological substances status: Secondary | ICD-10-CM

## 2021-04-01 DIAGNOSIS — E039 Hypothyroidism, unspecified: Secondary | ICD-10-CM | POA: Diagnosis present

## 2021-04-01 DIAGNOSIS — R4182 Altered mental status, unspecified: Secondary | ICD-10-CM

## 2021-04-01 LAB — CBC WITH DIFFERENTIAL/PLATELET
Abs Immature Granulocytes: 0.11 10*3/uL — ABNORMAL HIGH (ref 0.00–0.07)
Basophils Absolute: 0.1 10*3/uL (ref 0.0–0.1)
Basophils Relative: 1 %
Eosinophils Absolute: 0.2 10*3/uL (ref 0.0–0.5)
Eosinophils Relative: 2 %
HCT: 34.6 % — ABNORMAL LOW (ref 36.0–46.0)
Hemoglobin: 11.4 g/dL — ABNORMAL LOW (ref 12.0–15.0)
Immature Granulocytes: 2 %
Lymphocytes Relative: 37 %
Lymphs Abs: 2.4 10*3/uL (ref 0.7–4.0)
MCH: 32.4 pg (ref 26.0–34.0)
MCHC: 32.9 g/dL (ref 30.0–36.0)
MCV: 98.3 fL (ref 80.0–100.0)
Monocytes Absolute: 0.6 10*3/uL (ref 0.1–1.0)
Monocytes Relative: 10 %
Neutro Abs: 3 10*3/uL (ref 1.7–7.7)
Neutrophils Relative %: 48 %
Platelets: 159 10*3/uL (ref 150–400)
RBC: 3.52 MIL/uL — ABNORMAL LOW (ref 3.87–5.11)
RDW: 13.8 % (ref 11.5–15.5)
WBC: 6.4 10*3/uL (ref 4.0–10.5)
nRBC: 0 % (ref 0.0–0.2)

## 2021-04-01 LAB — BRAIN NATRIURETIC PEPTIDE: B Natriuretic Peptide: 136.6 pg/mL — ABNORMAL HIGH (ref 0.0–100.0)

## 2021-04-01 LAB — COMPREHENSIVE METABOLIC PANEL
ALT: 10 U/L (ref 0–44)
AST: 10 U/L — ABNORMAL LOW (ref 15–41)
Albumin: 2.4 g/dL — ABNORMAL LOW (ref 3.5–5.0)
Alkaline Phosphatase: 47 U/L (ref 38–126)
Anion gap: 8 (ref 5–15)
BUN: 7 mg/dL — ABNORMAL LOW (ref 8–23)
CO2: 27 mmol/L (ref 22–32)
Calcium: 7.9 mg/dL — ABNORMAL LOW (ref 8.9–10.3)
Chloride: 108 mmol/L (ref 98–111)
Creatinine, Ser: 0.53 mg/dL (ref 0.44–1.00)
GFR, Estimated: >60 mL/min (ref >60)
Glucose, Bld: 105 mg/dL — ABNORMAL HIGH (ref 70–99)
Potassium: 3.4 mmol/L — ABNORMAL LOW (ref 3.5–5.1)
Sodium: 143 mmol/L (ref 135–145)
Total Bilirubin: 0.5 mg/dL (ref 0.3–1.2)
Total Protein: 5.4 g/dL — ABNORMAL LOW (ref 6.5–8.1)

## 2021-04-01 LAB — RESP PANEL BY RT-PCR (FLU A&B, COVID) ARPGX2
Influenza A by PCR: NEGATIVE
Influenza B by PCR: NEGATIVE
SARS Coronavirus 2 by RT PCR: NEGATIVE

## 2021-04-01 LAB — TROPONIN I (HIGH SENSITIVITY): Troponin I (High Sensitivity): 3 ng/L

## 2021-04-01 LAB — LACTIC ACID, PLASMA: Lactic Acid, Venous: 1.2 mmol/L (ref 0.5–1.9)

## 2021-04-01 LAB — APTT: aPTT: 57 seconds — ABNORMAL HIGH (ref 24–36)

## 2021-04-01 LAB — PROTIME-INR
INR: 1.4 — ABNORMAL HIGH (ref 0.8–1.2)
Prothrombin Time: 17.6 seconds — ABNORMAL HIGH (ref 11.4–15.2)

## 2021-04-01 NOTE — ED Provider Notes (Signed)
[facility] Provider Note    Event Date/Time   First MD Initiated Contact with Patient 04/01/21 2258     (approximate)   History   Shortness of Breath   HPI  Tammy Shepard is a 71 y.o. female with history of hypertension, diabetes, hyperlipidemia, CVA with left-sided hemiparesis, seizures, asthma on 2-1/2 L of oxygen chronically, DVT on Eliquis, diastolic heart failure who presents to the emergency department with EMS from her nursing facility [facility].  I spoke with [facility] the nurse that was caring for patient since 7 PM tonight at [facility].  She states that when she arrived the patient seemed less alert than normal and was complaining of feeling short of breath.  She states her oxygen saturation was 94%.  She states patient is actively getting treatment for pneumonia.  It appears per her MAR she is on cefdinir 300 mg twice daily that started on 03/31/2021 and azithromycin that started on 03/30/2021.  EMS reports she had a temperature orally of 102 but was not given antipyretics by the nursing facility or EMS.  They report her oxygen saturation was in the upper 80s on room air.  Patient is not able to answer many questions.  She denies any pain.  She is somnolent but arousable.     History provided by EMS and nursing home staff.  Level 5 caveat due to altered mental status..    Past Medical History:  Diagnosis Date   Asthma    CVA (cerebrovascular accident due to intracerebral hemorrhage) ([place])    Diabetes mellitus without complication ([place])    Hyperlipemia    Hypertension    Seizures ([place])    Stroke [facility])     Past Surgical History:  Procedure Laterality Date   BRAIN SURGERY     CERVICAL FUSION      MEDICATIONS:  Prior to Admission medications   Medication Sig Start Date End Date Taking? Authorizing Provider  apixaban (ELIQUIS) 5 MG TABS tablet Take 1 tablet (5 mg total) by mouth 2 (two) times daily. 09/29/20   Terrilee Croak, MD   ARIPiprazole (ABILIFY) 5 MG tablet Take 5 mg by mouth daily.     [provider]  atorvastatin (LIPITOR) 40 MG tablet Take 40 mg by mouth every morning.    [provider]  clonazePAM (KLONOPIN) 0.5 MG tablet Take 1 tablet (0.5 mg total) by mouth 2 (two) times daily. 02/18/21   Regalado, Belkys A, MD  divalproex (DEPAKOTE SPRINKLE) 125 MG capsule Take 500 mg by mouth 2 (two) times daily.    [provider]  FLUoxetine (PROZAC) 40 MG capsule Take 40 mg by mouth daily.    [provider]  guaiFENesin (MUCINEX) 600 MG 12 hr tablet Take 2 tablets (1,200 mg total) by mouth 2 (two) times daily. 02/18/21   Regalado, Belkys A, MD  levothyroxine (SYNTHROID) 150 MCG tablet Take 150 mcg by mouth daily before breakfast.     [provider]  metoprolol tartrate (LOPRESSOR) 25 MG tablet Take 25 mg by mouth 2 (two) times daily.    [provider]  Multiple Vitamin (MULTIVITAMIN WITH MINERALS) TABS tablet Take 1 tablet by mouth daily.    [provider]  neomycin-bacitracin-polymyxin (NEOSPORIN) ointment Apply 1 application topically daily. (Apply to toe wound)    [provider]  nitroGLYCERIN (NITROSTAT) 0.4 MG SL tablet Place 0.4 mg under the tongue every 5 (five) minutes as needed for chest pain.    [provider]  omeprazole (PRILOSEC) 10 MG capsule Take 10 mg by mouth daily. 09/18/15   [provider]  oxyCODONE-acetaminophen (PERCOCET/ROXICET) 5-325 MG tablet Take 1 tablet by mouth 3 (three) times daily. 02/18/21   Regalado, Belkys A, MD  polyethylene glycol (MIRALAX / GLYCOLAX) packet Take 17 g by mouth at bedtime.    [provider]  risperiDONE (RISPERDAL) 0.5 MG tablet Take 0.5 mg by mouth at bedtime.    [provider]  senna (SENOKOT) 8.6 MG tablet Take 2 tablets by mouth 2 (two) times daily as needed for constipation. 06/05/13   [provider]  White Petrolatum-Mineral Oil (REFRESH P.M.  OP) Apply 1 inch to eye at bedtime as needed (dry eyes).    [provider]    Physical Exam   Triage Vital Signs: ED Triage Vitals  Enc Vitals Group     BP 04/01/21 2211 140/66     Pulse Rate 04/01/21 2211 88     Resp 04/01/21 2211 17     Temp 04/01/21 2211 98.5 F (36.9 C)     Temp Source 04/01/21 2211 Rectal     SpO2 04/01/21 2211 94 %     Weight 04/01/21 2208 173 lb 6.4 oz (78.7 kg)     Height 04/01/21 2208 5\' 4"  (1.626 m)     Head Circumference --      Peak Flow --      Pain Score 04/01/21 2206 8     Pain Loc --      Pain Edu? --      Excl. in [place]? --     Most recent vital signs: Vitals:   04/01/21 2211 04/01/21 2300  BP: 140/66 (!) 149/66  Pulse: 88 83  Resp: 17 19  Temp: 98.5 F (36.9 C)   SpO2: 94% 92%    CONSTITUTIONAL: Alert and oriented to person only.  Very somnolent but arousable to noxious stimuli.  Will open her eyes and answer questions briefly but then go back to sleep.  Elderly, chronically ill-appearing HEAD: Normocephalic, atraumatic EYES: Conjunctivae clear, pupils appear equal, sclera nonicteric ENT: normal nose; moist mucous membranes NECK: Supple, normal ROM CARD: RRR; S1 and S2 appreciated; no murmurs, no clicks, no rubs, no gallops RESP: Normal chest excursion without splinting or tachypnea; rhonchorous breath sounds diffusely, sats 94% on nasal cannula, no wheezing, no respiratory distress ABD/GI: Normal bowel sounds; non-distended; soft, non-tender, no rebound, no guarding, no peritoneal signs BACK: The back appears normal EXT: Normal ROM in all joints; no deformity noted, no edema; no cyanosis, atrophy of the left upper and lower extremity due to hemiparesis SKIN: Normal color for age and race; warm; no rash on exposed skin NEURO: No facial asymmetry.  Normal speech.  Left-sided hemiparesis. PSYCH: The patient's mood and manner are appropriate.   ED Results / Procedures / Treatments   LABS: (all labs ordered are listed, but  only abnormal results are displayed) Labs Reviewed  CBC WITH DIFFERENTIAL/PLATELET - Abnormal; Notable for the following components:      Result Value   RBC 3.52 (*)    Hemoglobin 11.4 (*)    HCT 34.6 (*)    Abs Immature Granulocytes 0.11 (*)    All other components within normal limits  COMPREHENSIVE METABOLIC PANEL - Abnormal; Notable for the following components:   Potassium 3.4 (*)    Glucose, Bld 105 (*)    BUN 7 (*)    Calcium 7.9 (*)    Total Protein 5.4 (*)  Albumin 2.4 (*)    AST 10 (*)    All other components within normal limits  BRAIN NATRIURETIC PEPTIDE - Abnormal; Notable for the following components:   B Natriuretic Peptide 136.6 (*)    All other components within normal limits  PROTIME-INR - Abnormal; Notable for the following components:   Prothrombin Time 17.6 (*)    INR 1.4 (*)    All other components within normal limits  APTT - Abnormal; Notable for the following components:   aPTT 57 (*)    All other components within normal limits  BLOOD GAS, ARTERIAL - Abnormal; Notable for the following components:   pO2, Arterial 66 (*)    Bicarbonate 28.5 (*)    Acid-Base Excess 3.4 (*)    All other components within normal limits  RESP PANEL BY RT-PCR (FLU A&B, COVID) ARPGX2  CULTURE, BLOOD (ROUTINE X 2)  CULTURE, BLOOD (ROUTINE X 2)  URINE CULTURE  LACTIC ACID, PLASMA  PROCALCITONIN  LACTIC ACID, PLASMA  URINALYSIS, COMPLETE (UACMP) WITH MICROSCOPIC  VALPROIC ACID LEVEL  CBG MONITORING, ED  TROPONIN I (HIGH SENSITIVITY)  TROPONIN I (HIGH SENSITIVITY)     EKG:  EKG Interpretation  Date/Time:  Wednesday April 01 2021 22:06:38 EST Ventricular Rate:  88 PR Interval:  124 QRS Duration: 94 QT Interval:  408 QTC Calculation: 494 R Axis:   64 Text Interpretation: Sinus rhythm Minimal ST depression, diffuse leads Borderline prolonged QT interval Confirmed by Pryor Curia [phone]) on 04/02/2021 12:21:45 AM         RADIOLOGY: My personal review  and interpretation of imaging: Chest x-ray shows left-sided pneumonia.  CT head shows no acute abnormality.  I have personally reviewed all radiology reports.   DG Chest 1 View  Result Date: 04/01/2021 CLINICAL DATA:  Shortness of breath.  Fever. EXAM: CHEST  1 VIEW COMPARISON:  Radiograph 02/14/2021 FINDINGS: Asymmetric lung opacities with left increased compared to right, likely combination of layering left pleural effusion and left perihilar airspace disease. There is minimal right infrahilar atelectasis. Heart size upper normal. There is aortic atherosclerosis. No pneumothorax. IMPRESSION: Asymmetric lung opacities with left increased compared to right, likely combination of layering left pleural effusion and left perihilar airspace disease/pneumonia. Minimal right infrahilar atelectasis. Electronically Signed   By: Keith Rake M.D.   On: 04/01/2021 22:36   CT HEAD WO CONTRAST (5MM)  Result Date: 04/01/2021 CLINICAL DATA:  Mental status change. EXAM: CT HEAD WITHOUT CONTRAST TECHNIQUE: Contiguous axial images were obtained from the base of the skull through the vertex without intravenous contrast. RADIATION DOSE REDUCTION: This exam was performed according to the departmental dose-optimization program which includes automated exposure control, adjustment of the mA and/or kV according to patient size and/or use of iterative reconstruction technique. COMPARISON:  Head CT 10/06/2019 FINDINGS: Brain: No acute intracranial hemorrhage. Multifocal encephalomalacia involving both frontal lobes, right parietal lobe, and right occipital lobe, not significantly changed from prior exam. Stable ventriculomegaly. Periventricular low-density is again seen and unchanged. There is no evidence of acute ischemia. No subdural or extra-axial collection. Vascular: Skull base atherosclerosis without hyperdense vessel. Skull: No fracture or focal lesion. Sinuses/Orbits: Partial opacification of lower left mastoid air  cells, chronic and unchanged. No acute findings. Other: None. IMPRESSION: 1. No acute intracranial abnormality. 2. Stable chronic findings including multifocal encephalomalacia and chronic small vessel ischemia, unchanged from prior exam. Electronically Signed   By: Keith Rake M.D.   On: 04/01/2021 23:51     PROCEDURES:  Critical Care performed:  Yes, see critical care procedure note(s)   CRITICAL CARE Performed by: Pryor Curia   Total critical care time: 45 minutes  Critical care time was exclusive of separately billable procedures and treating other patients.  Critical care was necessary to treat or prevent imminent or life-threatening deterioration.  Critical care was time spent personally by me on the following activities: development of treatment plan with patient and/or surrogate as well as nursing, discussions with consultants, evaluation of patient's response to treatment, examination of patient, obtaining history from patient or surrogate, ordering and performing treatments and interventions, ordering and review of laboratory studies, ordering and review of radiographic studies, pulse oximetry and re-evaluation of patient's condition.   Marland Kitchen1-3 Lead EKG Interpretation Performed by: Elva Breaker, Delice Bison, DO Authorized by: Samirah Scarpati, Delice Bison, DO     Interpretation: normal     ECG rate:  80   ECG rate assessment: normal     Rhythm: sinus rhythm     Ectopy: none     Conduction: normal      IMPRESSION / MDM / ASSESSMENT AND PLAN / ED COURSE  I reviewed the triage vital signs and the nursing notes.    Patient here with all mental status, shortness of breath in the setting of recent diagnosis of pneumonia on oral antibiotics.  The patient is on the cardiac monitor to evaluate for evidence of arrhythmia and/or significant heart rate changes.   DIFFERENTIAL DIAGNOSIS (includes but not limited to):   Pneumonia, PE, ACS, pneumothorax, CHF, CVA, intracranial hemorrhage, metabolic  encephalopathy, hepatic encephalopathy, UTI, dehydration, sepsis, bacteremia, meningitis, encephalitis, hypoglycemia, hypercapnia   PLAN: We will obtain CBC, BMP, troponin, lactic, procalcitonin, COVID and flu swabs, chest x-ray, head CT.  Rectal temp here is 98.5.   MEDICATIONS GIVEN IN ED: Medications  lactated ringers bolus 1,000 mL (has no administration in time range)  ipratropium-albuterol (DUONEB) 0.5-2.5 (3) MG/3ML nebulizer solution 3 mL (has no administration in time range)     ED COURSE: Patient's labs show no leukocytosis or leukopenia.  Normal electrolytes, renal function, LFTs.  Lactic normal.  Troponin negative.  Procalcitonin elevated at 0.61.  ABG reassuring.  Blood glucose normal.  Chest x-ray reviewed by myself and radiologist and shows bilateral pneumonia, worse on the left side.  Head CT also reviewed by myself that shows no intracranial abnormality but does show diffuse encephalomalacia that is chronic.  Patient remains hemodynamically stable.  Given altered mental status and pneumonia that seems to be failing oral treatment, will admit.  We will broaden her coverage with vancomycin and cefepime for HCAP.  Will discuss with hospitalist.   CONSULTS:  Consulted and discussed patient's case with hospitalist, Dr. Glo Herring.  I have recommended admission and consulting physician agrees and will place admission orders.  Patient (and family if present) agree with this plan.   I reviewed all nursing notes, vitals, pertinent previous records.  All labs, EKGs, imaging ordered have been independently reviewed and interpreted by myself.    OUTSIDE RECORDS REVIEWED: Reviewed patient's admission from 02/14/2021 -02/18/2021 where patient was admitted for respiratory failure, acute metabolic encephalopathy.  Patient was treated for bronchitis.  Had a VQ scan that was low probability of PE.  She has a contrast allergy.         FINAL CLINICAL IMPRESSION(S) / ED DIAGNOSES   Final  diagnoses:  HCAP (healthcare-associated pneumonia)  Altered mental status, unspecified altered mental status type     Rx / DC Orders   ED Discharge Orders  None        Note:  This document was prepared using Dragon voice recognition software and may include unintentional dictation errors.   Mariona Scholes, Delice Bison, DO 04/02/21 [phone]

## 2021-04-01 NOTE — ED Provider Notes (Signed)
Emergency Medicine Provider Triage Evaluation Note  Tammy Shepard , a 71 y.o. female  was evaluated in triage. PMH  type 2 diabetes, hypertension, hyperlipidemia, CVA, seizure, asthma, vascular dementia, hypoxic respiratory failure, DVT.  Patient is on chronic 4 L of oxygen at baseline.   Review of Systems  Positive: Shortness of breath, neck pain.  Reports she had neck pain since the 1980s. Negative: No chest pain.   Physical Exam  BP 140/66    Pulse 88    Temp 98.5 F (36.9 C) (Rectal)    Resp 17    Ht 5\' 4"  (1.626 m)    Wt 78.7 kg    SpO2 94%    BMI 29.76 kg/m  Gen:   Awake.  Appears moderately ill.  Alerts to voice, opens eyes, converses with examiner. Resp:  Normal effort she has notable rales in the left lung fields, clear on the right.  She exhibits mild increased work of breathing.  No wheezing.  She speaks in phrases. Other:  Mild lower extremity edema.  Oriented to self and being at the hospital.  Medical Decision Making  Medically screening exam initiated at 10:23 PM.  Appropriate orders placed.     , MD 04/01/21 2226

## 2021-04-01 NOTE — ED Triage Notes (Signed)
Pt presents to ER via [facility] from [facility] with c/o sob.  Pt has been treated for pna for last two weeks with azithromycin without improvement.  Pt was in high 80s on RA with ems and improved to 97% on 3L [place].  Pt had oral temp of 102 w/ems.  Pt noted to have rales on arrival.  Pt A&O to self and place only.

## 2021-04-02 ENCOUNTER — Encounter: Payer: Self-pay | Admitting: Internal Medicine

## 2021-04-02 DIAGNOSIS — E876 Hypokalemia: Secondary | ICD-10-CM

## 2021-04-02 DIAGNOSIS — Z79899 Other long term (current) drug therapy: Secondary | ICD-10-CM | POA: Diagnosis not present

## 2021-04-02 DIAGNOSIS — Z91040 Latex allergy status: Secondary | ICD-10-CM | POA: Diagnosis not present

## 2021-04-02 DIAGNOSIS — Z880 Allergy status to penicillin: Secondary | ICD-10-CM | POA: Diagnosis not present

## 2021-04-02 DIAGNOSIS — J9611 Chronic respiratory failure with hypoxia: Secondary | ICD-10-CM | POA: Diagnosis present

## 2021-04-02 DIAGNOSIS — J189 Pneumonia, unspecified organism: Secondary | ICD-10-CM | POA: Diagnosis present

## 2021-04-02 DIAGNOSIS — I69354 Hemiplegia and hemiparesis following cerebral infarction affecting left non-dominant side: Secondary | ICD-10-CM | POA: Diagnosis not present

## 2021-04-02 DIAGNOSIS — E039 Hypothyroidism, unspecified: Secondary | ICD-10-CM

## 2021-04-02 DIAGNOSIS — R0602 Shortness of breath: Secondary | ICD-10-CM | POA: Diagnosis present

## 2021-04-02 DIAGNOSIS — I110 Hypertensive heart disease with heart failure: Secondary | ICD-10-CM | POA: Diagnosis present

## 2021-04-02 DIAGNOSIS — Z87891 Personal history of nicotine dependence: Secondary | ICD-10-CM | POA: Diagnosis not present

## 2021-04-02 DIAGNOSIS — Z881 Allergy status to other antibiotic agents status: Secondary | ICD-10-CM | POA: Diagnosis not present

## 2021-04-02 DIAGNOSIS — G9341 Metabolic encephalopathy: Secondary | ICD-10-CM | POA: Diagnosis present

## 2021-04-02 DIAGNOSIS — E785 Hyperlipidemia, unspecified: Secondary | ICD-10-CM | POA: Diagnosis present

## 2021-04-02 DIAGNOSIS — I10 Essential (primary) hypertension: Secondary | ICD-10-CM

## 2021-04-02 DIAGNOSIS — Z20822 Contact with and (suspected) exposure to covid-19: Secondary | ICD-10-CM | POA: Diagnosis present

## 2021-04-02 DIAGNOSIS — I5032 Chronic diastolic (congestive) heart failure: Secondary | ICD-10-CM | POA: Diagnosis present

## 2021-04-02 DIAGNOSIS — Z91048 Other nonmedicinal substance allergy status: Secondary | ICD-10-CM | POA: Diagnosis not present

## 2021-04-02 DIAGNOSIS — G40909 Epilepsy, unspecified, not intractable, without status epilepticus: Secondary | ICD-10-CM

## 2021-04-02 DIAGNOSIS — Z66 Do not resuscitate: Secondary | ICD-10-CM | POA: Diagnosis present

## 2021-04-02 DIAGNOSIS — Y95 Nosocomial condition: Secondary | ICD-10-CM | POA: Diagnosis present

## 2021-04-02 DIAGNOSIS — Z91041 Radiographic dye allergy status: Secondary | ICD-10-CM | POA: Diagnosis not present

## 2021-04-02 DIAGNOSIS — Z7901 Long term (current) use of anticoagulants: Secondary | ICD-10-CM | POA: Diagnosis not present

## 2021-04-02 DIAGNOSIS — Z885 Allergy status to narcotic agent status: Secondary | ICD-10-CM | POA: Diagnosis not present

## 2021-04-02 DIAGNOSIS — Z882 Allergy status to sulfonamides status: Secondary | ICD-10-CM | POA: Diagnosis not present

## 2021-04-02 DIAGNOSIS — E119 Type 2 diabetes mellitus without complications: Secondary | ICD-10-CM | POA: Diagnosis present

## 2021-04-02 DIAGNOSIS — Z79890 Hormone replacement therapy (postmenopausal): Secondary | ICD-10-CM | POA: Diagnosis not present

## 2021-04-02 LAB — URINALYSIS, COMPLETE (UACMP) WITH MICROSCOPIC
Bacteria, UA: NONE SEEN
Bilirubin Urine: NEGATIVE
Glucose, UA: NEGATIVE mg/dL
Hgb urine dipstick: NEGATIVE
Ketones, ur: NEGATIVE mg/dL
Leukocytes,Ua: NEGATIVE
Nitrite: NEGATIVE
Protein, ur: NEGATIVE mg/dL
Specific Gravity, Urine: 1.008 (ref 1.005–1.030)
pH: 7 (ref 5.0–8.0)

## 2021-04-02 LAB — CBC WITH DIFFERENTIAL/PLATELET
Abs Immature Granulocytes: 0.13 10*3/uL — ABNORMAL HIGH (ref 0.00–0.07)
Basophils Absolute: 0.1 10*3/uL (ref 0.0–0.1)
Basophils Relative: 1 %
Eosinophils Absolute: 0.2 10*3/uL (ref 0.0–0.5)
Eosinophils Relative: 2 %
HCT: 34.8 % — ABNORMAL LOW (ref 36.0–46.0)
Hemoglobin: 11.5 g/dL — ABNORMAL LOW (ref 12.0–15.0)
Immature Granulocytes: 2 %
Lymphocytes Relative: 26 %
Lymphs Abs: 2.2 10*3/uL (ref 0.7–4.0)
MCH: 32.6 pg (ref 26.0–34.0)
MCHC: 33 g/dL (ref 30.0–36.0)
MCV: 98.6 fL (ref 80.0–100.0)
Monocytes Absolute: 0.8 10*3/uL (ref 0.1–1.0)
Monocytes Relative: 9 %
Neutro Abs: 5.4 10*3/uL (ref 1.7–7.7)
Neutrophils Relative %: 60 %
Platelets: 163 10*3/uL (ref 150–400)
RBC: 3.53 MIL/uL — ABNORMAL LOW (ref 3.87–5.11)
RDW: 14 % (ref 11.5–15.5)
WBC: 8.8 10*3/uL (ref 4.0–10.5)
nRBC: 0 % (ref 0.0–0.2)

## 2021-04-02 LAB — BLOOD GAS, ARTERIAL
Acid-Base Excess: 3.4 mmol/L — ABNORMAL HIGH (ref 0.0–2.0)
Bicarbonate: 28.5 mmol/L — ABNORMAL HIGH (ref 20.0–28.0)
Drawn by: 23509
FIO2: 21 %
O2 Saturation: 95.3 %
Patient temperature: 37
pCO2 arterial: 44 mmHg (ref 32–48)
pH, Arterial: 7.42 (ref 7.35–7.45)
pO2, Arterial: 66 mmHg — ABNORMAL LOW (ref 83–108)

## 2021-04-02 LAB — TSH: TSH: 0.862 u[IU]/mL (ref 0.350–4.500)

## 2021-04-02 LAB — COMPREHENSIVE METABOLIC PANEL
ALT: 8 U/L (ref 0–44)
AST: 13 U/L — ABNORMAL LOW (ref 15–41)
Albumin: 2.4 g/dL — ABNORMAL LOW (ref 3.5–5.0)
Alkaline Phosphatase: 40 U/L (ref 38–126)
Anion gap: 9 (ref 5–15)
BUN: 7 mg/dL — ABNORMAL LOW (ref 8–23)
CO2: 24 mmol/L (ref 22–32)
Calcium: 7.7 mg/dL — ABNORMAL LOW (ref 8.9–10.3)
Chloride: 108 mmol/L (ref 98–111)
Creatinine, Ser: 0.48 mg/dL (ref 0.44–1.00)
GFR, Estimated: >60 mL/min (ref >60)
Glucose, Bld: 82 mg/dL (ref 70–99)
Potassium: 4.1 mmol/L (ref 3.5–5.1)
Sodium: 141 mmol/L (ref 135–145)
Total Bilirubin: 1.7 mg/dL — ABNORMAL HIGH (ref 0.3–1.2)
Total Protein: 5.3 g/dL — ABNORMAL LOW (ref 6.5–8.1)

## 2021-04-02 LAB — MAGNESIUM
Magnesium: 1.9 mg/dL (ref 1.7–2.4)
Magnesium: 1.7 mg/dL (ref 1.7–2.4)

## 2021-04-02 LAB — MRSA NEXT GEN BY PCR, NASAL: MRSA by PCR Next Gen: NOT DETECTED

## 2021-04-02 LAB — STREP PNEUMONIAE URINARY ANTIGEN: Strep Pneumo Urinary Antigen: NEGATIVE

## 2021-04-02 LAB — LACTIC ACID, PLASMA: Lactic Acid, Venous: 0.9 mmol/L (ref 0.5–1.9)

## 2021-04-02 LAB — VALPROIC ACID LEVEL: Valproic Acid Lvl: 38 ug/mL — ABNORMAL LOW (ref 50.0–100.0)

## 2021-04-02 LAB — PHOSPHORUS: Phosphorus: 3 mg/dL (ref 2.5–4.6)

## 2021-04-02 LAB — CBG MONITORING, ED: Glucose-Capillary: 79 mg/dL (ref 70–99)

## 2021-04-02 LAB — TROPONIN I (HIGH SENSITIVITY): Troponin I (High Sensitivity): 3 ng/L (ref <18)

## 2021-04-02 LAB — PROCALCITONIN
Procalcitonin: 0.61 ng/mL
Procalcitonin: 0.73 ng/mL

## 2021-04-02 MED ORDER — LACTATED RINGERS IV BOLUS
1000.0000 mL | Freq: Once | INTRAVENOUS | Status: AC
  Administered 2021-04-02: 1000 mL via INTRAVENOUS

## 2021-04-02 MED ORDER — LEVOTHYROXINE SODIUM 50 MCG PO TABS
150.0000 ug | ORAL_TABLET | Freq: Every day | ORAL | Status: DC
  Administered 2021-04-03: 150 ug via ORAL
  Filled 2021-04-02: qty 1

## 2021-04-02 MED ORDER — IPRATROPIUM-ALBUTEROL 0.5-2.5 (3) MG/3ML IN SOLN
3.0000 mL | Freq: Once | RESPIRATORY_TRACT | Status: AC
  Administered 2021-04-02: 3 mL via RESPIRATORY_TRACT
  Filled 2021-04-02: qty 3

## 2021-04-02 MED ORDER — FLUOXETINE HCL 20 MG PO CAPS
40.0000 mg | ORAL_CAPSULE | Freq: Every day | ORAL | Status: DC
  Administered 2021-04-02 – 2021-04-03 (×2): 40 mg via ORAL
  Filled 2021-04-02 (×2): qty 2

## 2021-04-02 MED ORDER — APIXABAN 5 MG PO TABS
5.0000 mg | ORAL_TABLET | Freq: Two times a day (BID) | ORAL | Status: DC
  Administered 2021-04-02 – 2021-04-03 (×3): 5 mg via ORAL
  Filled 2021-04-02 (×4): qty 1

## 2021-04-02 MED ORDER — VANCOMYCIN HCL 1500 MG/300ML IV SOLN: 1500.0000 mg | INTRAVENOUS | Status: DC

## 2021-04-02 MED ORDER — DIVALPROEX SODIUM 125 MG PO CSDR
500.0000 mg | DELAYED_RELEASE_CAPSULE | Freq: Three times a day (TID) | ORAL | Status: DC
  Administered 2021-04-02 – 2021-04-03 (×2): 500 mg via ORAL
  Filled 2021-04-02 (×4): qty 4

## 2021-04-02 MED ORDER — POTASSIUM CHLORIDE 10 MEQ/100ML IV SOLN
10.0000 meq | INTRAVENOUS | Status: AC
  Administered 2021-04-02 (×2): 10 meq via INTRAVENOUS
  Filled 2021-04-02: qty 100

## 2021-04-02 MED ORDER — SODIUM CHLORIDE 0.9 % IV SOLN
2.0000 g | Freq: Once | INTRAVENOUS | Status: AC
  Administered 2021-04-02: 2 g via INTRAVENOUS
  Filled 2021-04-02: qty 2

## 2021-04-02 MED ORDER — METOPROLOL TARTRATE 25 MG PO TABS
25.0000 mg | ORAL_TABLET | Freq: Two times a day (BID) | ORAL | Status: DC
  Administered 2021-04-02 – 2021-04-03 (×3): 25 mg via ORAL
  Filled 2021-04-02 (×3): qty 1

## 2021-04-02 MED ORDER — LOSARTAN POTASSIUM 25 MG PO TABS
25.0000 mg | ORAL_TABLET | Freq: Every day | ORAL | Status: DC
  Administered 2021-04-02 – 2021-04-03 (×2): 25 mg via ORAL
  Filled 2021-04-02 (×2): qty 1

## 2021-04-02 MED ORDER — ACETAMINOPHEN 650 MG RE SUPP: 650.0000 mg | Freq: Four times a day (QID) | RECTAL | Status: DC | PRN

## 2021-04-02 MED ORDER — ADULT MULTIVITAMIN W/MINERALS CH
1.0000 | ORAL_TABLET | Freq: Every day | ORAL | Status: DC
  Administered 2021-04-02 – 2021-04-03 (×2): 1 via ORAL
  Filled 2021-04-02 (×2): qty 1

## 2021-04-02 MED ORDER — ACETAMINOPHEN 325 MG PO TABS
650.0000 mg | ORAL_TABLET | Freq: Four times a day (QID) | ORAL | Status: DC | PRN
  Administered 2021-04-03: 650 mg via ORAL
  Filled 2021-04-02: qty 2

## 2021-04-02 MED ORDER — IPRATROPIUM-ALBUTEROL 0.5-2.5 (3) MG/3ML IN SOLN: 3.0000 mL | Freq: Three times a day (TID) | RESPIRATORY_TRACT | Status: DC | PRN

## 2021-04-02 MED ORDER — RISPERIDONE 1 MG PO TABS
1.0000 mg | ORAL_TABLET | Freq: Every day | ORAL | Status: DC
Start: 2021-04-02 — End: 2021-04-03
  Administered 2021-04-02: 1 mg via ORAL
  Filled 2021-04-02 (×2): qty 1

## 2021-04-02 MED ORDER — POLYVINYL ALCOHOL 1.4 % OP SOLN
1.0000 [drp] | Freq: Every evening | OPHTHALMIC | Status: DC | PRN
Start: 2021-04-02 — End: 2021-04-03
  Filled 2021-04-02: qty 15

## 2021-04-02 MED ORDER — DIVALPROEX SODIUM 125 MG PO CSDR
500.0000 mg | DELAYED_RELEASE_CAPSULE | Freq: Two times a day (BID) | ORAL | Status: DC
  Administered 2021-04-02: 500 mg via ORAL
  Filled 2021-04-02 (×2): qty 4

## 2021-04-02 MED ORDER — POLYETHYLENE GLYCOL 3350 17 G PO PACK
17.0000 g | PACK | Freq: Every day | ORAL | Status: DC
  Administered 2021-04-02: 21:00:00 17 g via ORAL
  Filled 2021-04-02: qty 1

## 2021-04-02 MED ORDER — LABETALOL HCL 5 MG/ML IV SOLN
10.0000 mg | INTRAVENOUS | Status: DC | PRN
  Administered 2021-04-02: 10 mg via INTRAVENOUS
  Filled 2021-04-02 (×2): qty 4

## 2021-04-02 MED ORDER — VANCOMYCIN HCL 1750 MG/350ML IV SOLN
1750.0000 mg | Freq: Once | INTRAVENOUS | Status: AC
  Administered 2021-04-02: 1750 mg via INTRAVENOUS
  Filled 2021-04-02: qty 350

## 2021-04-02 MED ORDER — BUSPIRONE HCL 10 MG PO TABS
10.0000 mg | ORAL_TABLET | Freq: Two times a day (BID) | ORAL | Status: DC
  Administered 2021-04-02 – 2021-04-03 (×3): 10 mg via ORAL
  Filled 2021-04-02 (×2): qty 1
  Filled 2021-04-02: qty 2

## 2021-04-02 MED ORDER — CLONAZEPAM 0.5 MG PO TABS
1.0000 mg | ORAL_TABLET | Freq: Two times a day (BID) | ORAL | Status: DC | PRN
  Administered 2021-04-02 – 2021-04-03 (×2): 1 mg via ORAL
  Filled 2021-04-02 (×2): qty 2

## 2021-04-02 MED ORDER — SODIUM CHLORIDE 0.9 % IV SOLN
2.0000 g | Freq: Three times a day (TID) | INTRAVENOUS | Status: DC
  Administered 2021-04-02 – 2021-04-03 (×4): 2 g via INTRAVENOUS
  Filled 2021-04-02 (×6): qty 2

## 2021-04-02 NOTE — ED Notes (Signed)
MD Howerter @ the bedside.

## 2021-04-02 NOTE — Progress Notes (Addendum)
Pharmacy Antibiotic Note  Tammy Shepard is a 71 y.o. female admitted on 04/01/2021 with HCAP.  Pharmacy has been consulted for Cefepime and Vancomycin dosing. 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  Plan: Cefepime 2 gm q8h per indication and renal fxn.  Vancomycin Pt given initial dose of Vancomycin 1750 Vancomycin 1500 mg IV Q 24 hrs will infuse over extended time due to possible h/o redman syndrome reaction.  Goal AUC 400-550. Expected AUC: 509.4 SCr used: 0.8 (Actual 0.53 on 2/22)  Pharmacy will continue to follow and will adjust abx dosing whenever warranted.   Height: 5\' 4"  (162.6 cm) Weight: 78.7 kg (173 lb 6.4 oz) IBW/kg (Calculated) : 54.7  Temp (24hrs), Avg:98.5 F (36.9 C), Min:98.5 F (36.9 C), Max:98.5 F (36.9 C)  Recent Labs  Lab 04/01/21 2214  WBC 6.4  CREATININE 0.53  LATICACIDVEN 1.2    Estimated Creatinine Clearance: 67.4 mL/min (by C-G formula based on SCr of 0.53 mg/dL).    Allergies  Allergen Reactions   Amoxicillin Rash    Tolerating rocephin 10/29/15   Ciprofloxacin Anaphylaxis   Clindamycin Hcl Swelling, Rash and Other (See Comments)    Other Reaction: BAD RASH AND SWELLING, SKIN BR Severe rash and swelling. Skin break down.   Penicillins Anaphylaxis    Tolerating rocephin 11/01/15   Codeine Nausea And Vomiting   Baclofen Itching and Rash   Contrast Media [Iodinated Contrast Media] Itching and Rash   Erythromycin Rash   Iodides Rash    Povidone iodine; betadine   Latex Itching and Rash   Nickel Rash and Other (See Comments)    OTHER REACTION   Soap Itching and Rash   Sulfa Antibiotics Rash   Tape Rash    (silk tape only) skin break down   Tolterodine Rash   Vancomycin Rash    Antimicrobials this admission: 2/23 Cefepime >>  2/23 Vancomycin >>   Microbiology results: 2/22 BCx: Pending 2/22 UCx: Pending   Thank you for allowing  pharmacy to be a part of this patients care.  3/22, PharmD, [facility] 04/02/2021 3:35 AM

## 2021-04-02 NOTE — Progress Notes (Signed)
PHARMACY -  BRIEF ANTIBIOTIC NOTE   Pharmacy has received consult(s) for Cefepime and Vancomycin from an ED provider.  The patient's profile has been reviewed for ht/wt/allergies/indication/available labs.  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  One time order(s) placed for Cefepime 2 gm and Vancomycin 1750 mg to be infused over 3 hrs.  Further antibiotics/pharmacy consults should be ordered by admitting physician if indicated.                       Thank you, Otelia Sergeant, PharmD, [facility] 04/02/2021 12:45 AM

## 2021-04-02 NOTE — H&P (Signed)
History and Physical    PLEASE NOTE THAT DRAGON DICTATION SOFTWARE WAS USED IN THE CONSTRUCTION OF THIS NOTE.   Tammy Shepard JZP:[id number] DOB: Apr 13, 1950 DOA: 04/01/2021  PCP: Kirk Ruths, MD  Patient coming from: [place] have personally briefly reviewed patient's old medical records in [place]  Chief Complaint: Increased somnolence  HPI: Tammy Shepard is a 71 y.o. female with medical history significant for stroke with residual left hemiparesis, chronic hypoxic respiratory failure on 3 L continuous nasal cannula, hypertension, seizure disorder, recently diagnosed DVT, now on Eliquis, acquired hypothyroidism, who is admitted to [facility] on 04/01/2021 with acute metabolic encephalopathy after presenting from SNF to [facility] ED for evaluation of altered mental status.  In the setting of the patient's altered mental status, the following history is provided via my discussions with the EDP as well as via chart review in addition to the [facility] staff report.  SNF staff report patient has exhibited acute 3 days of progressive somnolence relative to baseline mental status.  They also noted worsening nonproductive cough over that timeframe.  The patient was reportedly hospitalized approximately 1 month ago for similar at which time she was admitted for acute metabolic encephalopathy in the setting of bronchitis.  She was treated with azithromycin as well as cefdinir, and ensuing mental status improved back to baseline before developing increasing somnolence over the last few days, as above.     ED Course:  Vital signs in the ED were notable for the following: Afebrile; heart rate 82-88; respiratory rate 17-22, oxygen saturation 94 to 97% on baseline 3 L nasal cannula.  Labs were notable for the following: ABG on room air showed 7.4 2/44/66/20 8.5.  CMP notable for sodium 143, creatinine 0.53.  High-sensitivity troponin I x2 were both on to be 3.  CBC  notable for a blood cell count 6400.  Procalcitonin 0.61.  Urinalysis ordered, with result currently pending.  Lactic acid 1.2.  COVID-19/influenza PCR negative.  Blood cultures x2 were collected prior to initiation of any IV antibiotics.  Imaging and additional notable ED work-up: Noncontrast CT head showed no evidence of acute intracranial process.  Chest x-ray showed evidence of bilateral airspace opacities, left greater than right, concerning for pneumonia in the absence of evidence of edema.  While in the ED, the following were administered: Duo nebulizer treatment, cefepime, IV vancomycin, and a 1 L lactated Ringer bolus.  Subsequently, the patient was admitted for further evaluation management of suspected acute metabolic encephalopathy in the setting of HCAP pneumonia.     Review of Systems: As per HPI otherwise 10 point review of systems negative.   Past Medical History:  Diagnosis Date   Asthma    CVA (cerebrovascular accident due to intracerebral hemorrhage) ([place])    Diabetes mellitus without complication ([place])    Hyperlipemia    Hypertension    Seizures ([place])    Stroke [facility])     Past Surgical History:  Procedure Laterality Date   BRAIN SURGERY     CERVICAL FUSION      Social History:  reports that she has quit smoking. She has never used smokeless tobacco. She reports that she does not drink alcohol and does not use drugs.   Allergies  Allergen Reactions   Amoxicillin Rash    Tolerating rocephin 10/29/15   Ciprofloxacin Anaphylaxis   Clindamycin Hcl Swelling, Rash and Other (See Comments)    Other Reaction: BAD RASH AND SWELLING, SKIN BR  Severe rash and swelling. Skin break down.   Penicillins Anaphylaxis    Tolerating rocephin 11/01/15   Codeine Nausea And Vomiting   Baclofen Itching and Rash   Contrast Media [Iodinated Contrast Media] Itching and Rash   Erythromycin Rash   Iodides Rash    Povidone iodine; betadine   Latex Itching and Rash   Nickel Rash  and Other (See Comments)    OTHER REACTION   Soap Itching and Rash   Sulfa Antibiotics Rash   Tape Rash    (silk tape only) skin break down   Tolterodine Rash   Vancomycin Rash    History reviewed. No pertinent family history.    Prior to Admission medications   Medication Sig Start Date End Date Taking? Authorizing Provider  apixaban (ELIQUIS) 5 MG TABS tablet Take 1 tablet (5 mg total) by mouth 2 (two) times daily. 09/29/20  Yes Dahal, Marlowe Aschoff, MD  atorvastatin (LIPITOR) 40 MG tablet Take 40 mg by mouth every morning.   Yes [provider]  azithromycin (ZITHROMAX) 250 MG tablet Take 250 mg by mouth as directed. 03/31/21  Yes [provider]  busPIRone (BUSPAR) 10 MG tablet Take 10 mg by mouth 2 (two) times daily. 03/31/21  Yes [provider]  cefdinir (OMNICEF) 300 MG capsule Take 300 mg by mouth 2 (two) times daily. 03/31/21  Yes [provider]  clonazePAM (KLONOPIN) 1 MG tablet Take 1 mg by mouth 2 (two) times daily as needed. 03/27/21  Yes [provider]  divalproex (DEPAKOTE SPRINKLE) 125 MG capsule Take 500 mg by mouth 2 (two) times daily.   Yes [provider]  FLUoxetine (PROZAC) 40 MG capsule Take 40 mg by mouth daily.   Yes [provider]  guaiFENesin (MUCINEX) 600 MG 12 hr tablet Take 2 tablets (1,200 mg total) by mouth 2 (two) times daily. 02/18/21  Yes Regalado, Belkys A, MD  ipratropium-albuterol (DUONEB) 0.5-2.5 (3) MG/3ML SOLN Take 3 mLs by nebulization 3 (three) times daily as needed. 03/30/21  Yes [provider]  levothyroxine (SYNTHROID) 150 MCG tablet Take 150 mcg by mouth daily before breakfast.    Yes [provider]  metoprolol tartrate (LOPRESSOR) 25 MG tablet Take 25 mg by mouth 2 (two) times daily.   Yes [provider]  oxyCODONE-acetaminophen (PERCOCET/ROXICET) 5-325 MG tablet Take 1 tablet by mouth 3 (three) times daily. 02/18/21  Yes Regalado, Belkys A, MD  polyethylene  glycol (MIRALAX / GLYCOLAX) packet Take 17 g by mouth at bedtime.   Yes [provider]  risperiDONE (RISPERDAL) 1 MG tablet Take 1 mg by mouth at bedtime.   Yes [provider]  ARIPiprazole (ABILIFY) 5 MG tablet Take 5 mg by mouth daily.  Patient not taking: Reported on 04/02/2021    [provider]  clonazePAM (KLONOPIN) 0.5 MG tablet Take 1 tablet (0.5 mg total) by mouth 2 (two) times daily. Patient not taking: Reported on 04/02/2021 02/18/21   Niel Hummer A, MD  Multiple Vitamin (MULTIVITAMIN WITH MINERALS) TABS tablet Take 1 tablet by mouth daily.    [provider]  neomycin-bacitracin-polymyxin (NEOSPORIN) ointment Apply 1 application topically daily. (Apply to toe wound) Patient not taking: Reported on 04/02/2021    [provider]  nitroGLYCERIN (NITROSTAT) 0.4 MG SL tablet Place 0.4 mg under the tongue every 5 (five) minutes as needed for chest pain.    [provider]  omeprazole (PRILOSEC) 10 MG capsule Take 10 mg by mouth daily. Patient not taking:  Reported on 04/02/2021 09/18/15   [provider]  senna (SENOKOT) 8.6 MG tablet Take 2 tablets by mouth 2 (two) times daily as needed for constipation. 06/05/13   [provider]  White Petrolatum-Mineral Oil (REFRESH P.M. OP) Apply 1 inch to eye at bedtime as needed (dry eyes).    [provider]     Objective    Physical Exam: Vitals:   04/01/21 2300 04/02/21 0130 04/02/21 0230 04/02/21 0531  BP: (!) 149/66 (!) 151/77 (!) 166/80 (!) 162/53  Pulse: 83 80 88 69  Resp: 19 19 (!) 22 14  Temp:      TempSrc:      SpO2: 92% 98% 97% 99%  Weight:      Height:        General: appears to be stated age; somnolent, will briefly open eyes to verbal stimuli, unable to follow directions at this time Skin: warm, dry, no rash Head:  AT/[place] Mouth:  Oral mucosa membranes appear moist, normal dentition Neck: supple; trachea midline Heart:  RRR; did not  appreciate any M/R/G Lungs: CTAB, did not appreciate any wheezes, rales, or rhonchi Abdomen: + BS; soft, ND, NT Vascular: 2+ pedal pulses b/l; 2+ radial pulses b/l Extremities: no peripheral edema, no muscle wasting Neuro:  In the setting of the patient's current mental status and associated inability to follow instructions, unable to perform full neurologic exam at this time.  As such, assessment of strength, sensation, and cranial nerves is limited at this time. Patient noted to spontaneously move all 4 extremities. No tremors.      Labs on Admission: I have personally reviewed following labs and imaging studies  CBC: Recent Labs  Lab 04/01/21 2214  WBC 6.4  NEUTROABS 3.0  HGB 11.4*  HCT 34.6*  MCV 98.3  PLT 825   Basic Metabolic Panel: Recent Labs  Lab 04/01/21 2214 04/02/21 0018  NA 143  --   K 3.4*  --   CL 108  --   CO2 27  --   GLUCOSE 105*  --   BUN 7*  --   CREATININE 0.53  --   CALCIUM 7.9*  --   MG  --  1.9   GFR: Estimated Creatinine Clearance: 67.4 mL/min (by C-G formula based on SCr of 0.53 mg/dL). Liver Function Tests: Recent Labs  Lab 04/01/21 2214  AST 10*  ALT 10  ALKPHOS 47  BILITOT 0.5  PROT 5.4*  ALBUMIN 2.4*   No results for input(s): LIPASE, AMYLASE in the last 168 hours. No results for input(s): AMMONIA in the last 168 hours. Coagulation Profile: Recent Labs  Lab 04/01/21 2216  INR 1.4*   Cardiac Enzymes: No results for input(s): CKTOTAL, CKMB, CKMBINDEX, TROPONINI in the last 168 hours. BNP (last 3 results) No results for input(s): PROBNP in the last 8760 hours. HbA1C: No results for input(s): HGBA1C in the last 72 hours. CBG: Recent Labs  Lab 04/02/21 0010  GLUCAP 79   Lipid Profile: No results for input(s): CHOL, HDL, LDLCALC, TRIG, CHOLHDL, LDLDIRECT in the last 72 hours. Thyroid Function Tests: No results for input(s): TSH, T4TOTAL, FREET4, T3FREE, THYROIDAB in the last 72 hours. Anemia Panel: No results for  input(s): VITAMINB12, FOLATE, FERRITIN, TIBC, IRON, RETICCTPCT in the last 72 hours. Urine analysis:    Component Value Date/Time   COLORURINE AMBER (A) 02/14/2021 1220   APPEARANCEUR CLOUDY (A) 02/14/2021 1220   APPEARANCEUR Cloudy 08/18/2011 1018   LABSPEC 1.027 02/14/2021 1220   LABSPEC  1.017 08/18/2011 1018   PHURINE 5.0 02/14/2021 1220   GLUCOSEU NEGATIVE 02/14/2021 1220   GLUCOSEU Negative 08/18/2011 1018   HGBUR MODERATE (A) 02/14/2021 1220   BILIRUBINUR NEGATIVE 02/14/2021 1220   BILIRUBINUR Negative 08/18/2011 1018   KETONESUR NEGATIVE 02/14/2021 1220   PROTEINUR 30 (A) 02/14/2021 1220   NITRITE POSITIVE (A) 02/14/2021 1220   LEUKOCYTESUR MODERATE (A) 02/14/2021 1220   LEUKOCYTESUR 3+ 08/18/2011 1018    Radiological Exams on Admission: DG Chest 1 View  Result Date: 04/01/2021 CLINICAL DATA:  Shortness of breath.  Fever. EXAM: CHEST  1 VIEW COMPARISON:  Radiograph 02/14/2021 FINDINGS: Asymmetric lung opacities with left increased compared to right, likely combination of layering left pleural effusion and left perihilar airspace disease. There is minimal right infrahilar atelectasis. Heart size upper normal. There is aortic atherosclerosis. No pneumothorax. IMPRESSION: Asymmetric lung opacities with left increased compared to right, likely combination of layering left pleural effusion and left perihilar airspace disease/pneumonia. Minimal right infrahilar atelectasis. Electronically Signed   By: Keith Rake M.D.   On: 04/01/2021 22:36   CT HEAD WO CONTRAST (5MM)  Result Date: 04/01/2021 CLINICAL DATA:  Mental status change. EXAM: CT HEAD WITHOUT CONTRAST TECHNIQUE: Contiguous axial images were obtained from the base of the skull through the vertex without intravenous contrast. RADIATION DOSE REDUCTION: This exam was performed according to the departmental dose-optimization program which includes automated exposure control, adjustment of the mA and/or kV according to patient  size and/or use of iterative reconstruction technique. COMPARISON:  Head CT 10/06/2019 FINDINGS: Brain: No acute intracranial hemorrhage. Multifocal encephalomalacia involving both frontal lobes, right parietal lobe, and right occipital lobe, not significantly changed from prior exam. Stable ventriculomegaly. Periventricular low-density is again seen and unchanged. There is no evidence of acute ischemia. No subdural or extra-axial collection. Vascular: Skull base atherosclerosis without hyperdense vessel. Skull: No fracture or focal lesion. Sinuses/Orbits: Partial opacification of lower left mastoid air cells, chronic and unchanged. No acute findings. Other: None. IMPRESSION: 1. No acute intracranial abnormality. 2. Stable chronic findings including multifocal encephalomalacia and chronic small vessel ischemia, unchanged from prior exam. Electronically Signed   By: Keith Rake M.D.   On: 04/01/2021 23:51    Assessment/Plan    Principal Problem:   Acute metabolic encephalopathy Active Problems:   Essential hypertension, benign   HCAP (healthcare-associated pneumonia)   Hypothyroid   Seizure disorder (HCC)   Hypokalemia      #) Acute metabolic encephalopathy: 2 3 days of progressive somnolence relative to baseline mental status, which appears to be metabolic in the setting of suspected bilateral pneumonia.  No overt evidence of additional underlying infectious process at this time, including COVID-19/influenza PCR negative.  However, urinalysis result currently pending.  No overt electrolyte abnormalities fizzing additional metabolic contribution at this time.  Within the confines of chronic left hemiparesis as a residual deficit from previous stroke, no overt evidence of acute focal neurologic deficits to suggest acute stroke, while presenting CT head shows no evidence of acute intracranial process.  Will patient has a diagnosis of underlying seizure disorder, no overt evidence to suggest  underlying seizures at this time.  Plan: Further evaluation and management of bilateral pneumonia, as further detailed below.  Keep n.p.o. until mental status improves sufficiently such that the patient is able to participate in and pass nursing bedside swallow screen.  Follow for results of urinalysis.  Fall precautions.  Check MMA, TSH, follow for result of valproic acid level.  Hold home scheduled Percocet for now.       #)  HCAP pneumonia: In the setting of hospitalization approximately 1 month ago in which the patient received IV antibiotics, she presents with new onset cough, with chest x-ray showing evidence of bilateral airspace opacities concerning for pneumonia, with elevated procalcitonin of 0.61.  This is in the setting of interval administration of azithromycin and cefdinir.  In the context of this history and interval antibiotics, will initiate broad-spectrum IV antibiotics in the form of IV Vanco and cefepime, while checking MRSA PCR to guide potential ensuing discontinuation of IV vancomycin.  Of note, aside from mild tachypnea, no additional SIRS criteria met at this time.  Therefore criteria for sepsis not currently met.  Lactic acid 1.2.  Blood cultures x2 were collected in the ED today prior to initiation of IV antibiotics.  Plan: Monitor for results of blood cultures x2.  Continue IV vancomycin and cefepime.  Repeat CBC with differential in the morning.  Check MRSA PCR, strep urine antigen.       #) Hypokalemia: Presenting serum potassium level noted to be 3.4.  Plan: In the setting of current n.p.o. status, will pursue potassium chloride 20 mEq IV over 2 hours x 1 dose now.  Add on serum magnesium level.  Repeat BMP in the morning.       #) Essential hypertension: Documented history of such, on Lopressor as an outpatient.  Plan: In the setting of current n.p.o. status as well as presenting infectious process, will hold home Lopressor for now.      #) History of  seizure disorder: Documented history of such, on Depakote as an outpatient.  Plan: Follow-up result of Depakote level.  Outpatient Depakote tentatively resuming, pending result of aforementioned serum Depakote level.      #) Acquired hypothyroidism: Documented history of such, on Synthroid as an outpatient.  Plan: In the setting of presenting acute encephalopathy, will check TSH.  Continue outpatient dose of Synthroid for now.      DVT prophylaxis: SCD's + outpatient Eliquis Code Status: DNR, has DNR paperwork on file Family Communication: none Disposition Plan: Per Rounding Team Consults called: none;  Admission status: Inpatient, med telemetry   PLEASE NOTE THAT DRAGON DICTATION SOFTWARE WAS USED IN THE CONSTRUCTION OF THIS NOTE.   [place] DO [facility] [place]   04/02/2021, 5:46 AM

## 2021-04-02 NOTE — ED Notes (Signed)
Pt linens changed, bath given, pericare performed, pt straight cathed for urine sample, purewick not successful. Patient repositioned for comfort.

## 2021-04-02 NOTE — Progress Notes (Signed)
No charge progress note.  Tammy Shepard is a 71 y.o. female with medical history significant for stroke with residual left hemiparesis, chronic hypoxic respiratory failure on 3 L continuous nasal cannula, hypertension, seizure disorder, recently diagnosed DVT, now on Eliquis, acquired hypothyroidism, who is admitted to [facility] on 04/01/2021 with acute metabolic encephalopathy after presenting from SNF to [facility] ED for evaluation of altered mental status.  Per rehab note patient was becoming more somnolent and there was some concern of worsening nonproductive cough.  Recent hospitalization for concern of metabolic encephalopathy and underlying bronchitis.  She was treated with Zithromax and cefdinir and completed the course..  On arrival she was hemodynamically stable, saturating well on her baseline oxygen requirement of 3 L. Somnolent with ABGs shows 7.4 2/44/66.  COVID-19 and influenza PCR negative.  No lactic acidosis.  Procalcitonin elevated at 0.61>.0.73.  Chest x-ray with bilateral airspace opacities, left greater than right, concerning for pneumonia.  CT head was negative for any acute abnormality.  Patient was started on cefepime and vancomycin for concern of HCAP.  Patient was more alert and able to answer appropriately when seen during morning rounds.  Continues to have some cough.  Exam was within normal limit. Remained afebrile.  No leukocytosis.  Upward trending procalcitonin.  MRSA swab was checked and it was negative.  Depakote levels at 38 which was low, improved from prior check.  -Discontinue vancomycin. -Continue cefepime for now-closely monitor for any seizure-like activity as it decreases he is at pressure.  Multiple allergies noted which include fluoroquinolones. -Ask pharmacy to adjust the dose of Depakote. -Blood pressure was mildly elevated-adding losartan to home dose of metoprolol. -Continue to monitor -If remains stable can be discharged back to facility tomorrow

## 2021-04-02 NOTE — Plan of Care (Signed)

## 2021-04-02 NOTE — Progress Notes (Signed)
MEDICATION RELATED CONSULT NOTE   Pharmacy Consult for :Depakote dose adjustment Indication: seizure disorder  Allergies  Allergen Reactions   Amoxicillin Rash    Tolerating rocephin 10/29/15   Ciprofloxacin Anaphylaxis   Clindamycin Hcl Swelling, Rash and Other (See Comments)    Other Reaction: BAD RASH AND SWELLING, SKIN BR Severe rash and swelling. Skin break down.   Penicillins Anaphylaxis    Tolerating rocephin 11/01/15   Codeine Nausea And Vomiting   Baclofen Itching and Rash   Contrast Media [Iodinated Contrast Media] Itching and Rash   Erythromycin Rash   Iodides Rash    Povidone iodine; betadine   Latex Itching and Rash   Nickel Rash and Other (See Comments)    OTHER REACTION   Soap Itching and Rash   Sulfa Antibiotics Rash   Tape Rash    (silk tape only) skin break down   Tolterodine Rash   Vancomycin Rash    Patient Measurements: Height: 5\' 4"  (162.6 cm) Weight: 78.7 kg (173 lb 6.4 oz) IBW/kg (Calculated) : 54.7  Vital Signs: Temp: 98.5 F (36.9 C) (02/23 1541) Temp Source: Oral (02/23 1541) BP: 170/74 (02/23 1541) Pulse Rate: 73 (02/23 1541) Intake/Output from previous day: 02/22 0701 - 02/23 0700 In: 1350.1 [IV Piggyback:1350.1] Out: -  Intake/Output from this shift: Total I/O In: 399.6 [IV Piggyback:399.6] Out: -   Labs: Recent Labs    04/01/21 2214 04/01/21 2216 04/02/21 0018 04/02/21 0535  WBC 6.4  --   --  8.8  HGB 11.4*  --   --  11.5*  HCT 34.6*  --   --  34.8*  PLT 159  --   --  163  APTT  --  57*  --   --   CREATININE 0.53  --   --  0.48  MG  --   --  1.9 1.7  PHOS  --   --   --  3.0  ALBUMIN 2.4*  --   --  2.4*  PROT 5.4*  --   --  5.3*  AST 10*  --   --  13*  ALT 10  --   --  8  ALKPHOS 47  --   --  40  BILITOT 0.5  --   --  1.7*   Estimated Creatinine Clearance: 67.4 mL/min (by C-G formula based on SCr of 0.48 mg/dL).     Medications:  Scheduled:   apixaban  5 mg Oral BID   busPIRone  10 mg Oral BID   divalproex   500 mg Oral Q8H   FLUoxetine  40 mg Oral Daily   levothyroxine  150 mcg Oral Q0600   losartan  25 mg Oral Daily   metoprolol tartrate  25 mg Oral BID   multivitamin with minerals  1 tablet Oral Daily   polyethylene glycol  17 g Oral QHS   risperiDONE  1 mg Oral QHS    Assessment: Patient admitted with acute metabolic encephalopathy and pneumonia. Valproic acid level ordered by MD. Level resulted at 38 and no missed doses reported.  Goal of Therapy:  Valproic acid level at goal range of 50-100  Plan:  Increase valproic acid by 5-10mg /kg/day (do not exceed 60mg /kg/day). New dose Depakote 500mg  TID (total daily dose 19mg /kg/day). Repeat Valproic acid level in 2-3 days  Shantia Sanford Rodriguez-Guzman PharmD, BCPS 04/02/2021 5:49 PM

## 2021-04-02 NOTE — TOC Initial Note (Signed)
Transition of Care [facility]) - Initial/Assessment Note    Patient Details  Name: CECILA SATCHER MRN: [id number] Date of Birth: [birth year]  Transition of Care [facility]) CM/SW Contact:    Caryn Section, RN Phone Number: 04/02/2021, 3:43 PM  Clinical Narrative:      Patient is a resident of [facility].  As per [facility] plans to take patient back at discharge.             Expected Discharge Plan: Skilled Nursing Facility Barriers to Discharge: Continued Medical Work up   Patient Goals and CMS Choice     Choice offered to / list presented to : NA  Expected Discharge Plan and Services Expected Discharge Plan: Skilled Nursing Facility   Discharge Planning Services: CM Consult Post Acute Care Choice: Skilled Nursing Facility Living arrangements for the past 2 months: Skilled Nursing Facility                                      Prior Living Arrangements/Services Living arrangements for the past 2 months: Skilled Nursing Facility Lives with:: Facility Resident Patient language and need for interpreter reviewed:: Yes Do you feel safe going back to the place where you live?: Yes      Need for Family Participation in Patient Care: Yes (Comment) Care giver support system in place?: Yes (comment)   Criminal Activity/Legal Involvement Pertinent to Current Situation/Hospitalization: No - Comment as needed  Activities of Daily Living      Permission Sought/Granted Permission sought to share information with : Case Manager Permission granted to share information with : Yes, Verbal Permission Granted     Permission granted to share info w AGENCY: Armed forces operational officer        Emotional Assessment Appearance:: Appears stated age       Alcohol / Substance Use: Not Applicable Psych Involvement: No (comment)  Admission diagnosis:  Shortness of breath [R06.02] HCAP (healthcare-associated pneumonia) [J18.9] Altered mental status, unspecified  altered mental status type [R41.82] Acute metabolic encephalopathy [G93.41] Patient Active Problem List   Diagnosis Date Noted   Acute metabolic encephalopathy 04/02/2021   Hypokalemia 04/02/2021   Respiratory distress 02/14/2021   DVT (deep venous thrombosis) (HCC) 02/14/2021   Seizure disorder (HCC) 02/14/2021   Pressure injury of skin 09/18/2020   HCAP (healthcare-associated pneumonia) 09/18/2020   Type 2 diabetes mellitus with complication, without long-term current use of insulin (HCC) 04/21/2016   H/O: CVA (cerebrovascular accident) 11/04/2015   Essential hypertension, benign 11/04/2015   Fever in adult    Generalized weakness    Recurrent UTI 10/31/2015   Fever 10/31/2015   UTI (lower urinary tract infection) 10/31/2015   Sacral ulcer (HCC) 06/29/2014   Hypothyroid 11/28/2012   PCP:  Lauro Regulus, MD Pharmacy:   [facility] [place],  - [address] [place] [address] Phone: [phone] Fax: [phone]     Social Determinants of Health (SDOH) Interventions    Readmission Risk Interventions Readmission Risk Prevention Plan 04/02/2021  Transportation Screening Complete  PCP or Specialist Appt within 3-5 Days Complete  HRI or Home Care Consult Not Complete  HRI or Home Care Consult comments Patient is a resident of SNF  Social Work Consult for Recovery Care Planning/Counseling Not Complete  SW consult not completed comments RNCM assigned to case  Palliative Care Screening Not Applicable  Medication Review (  RN Care Manager) Complete  Some recent data might be hidden

## 2021-04-03 LAB — RESPIRATORY PANEL BY PCR

## 2021-04-03 LAB — URINE CULTURE: Culture: NO GROWTH

## 2021-04-03 MED ORDER — CEFUROXIME AXETIL 500 MG PO TABS
500.0000 mg | ORAL_TABLET | Freq: Two times a day (BID) | ORAL | 0 refills | Status: AC
Start: 2021-04-03 — End: 2021-04-08

## 2021-04-03 MED ORDER — LOSARTAN POTASSIUM 25 MG PO TABS
25.0000 mg | ORAL_TABLET | Freq: Every day | ORAL | Status: AC
Start: 2021-04-04 — End: ?

## 2021-04-03 MED ORDER — DIVALPROEX SODIUM 125 MG PO CSDR
500.0000 mg | DELAYED_RELEASE_CAPSULE | Freq: Three times a day (TID) | ORAL | Status: AC
Start: 2021-04-03 — End: ?

## 2021-04-03 MED ORDER — GUAIFENESIN ER 600 MG PO TB12
1200.0000 mg | ORAL_TABLET | Freq: Two times a day (BID) | ORAL | 0 refills | Status: AC | PRN
Start: 2021-04-03 — End: ?

## 2021-04-03 MED ORDER — CEFUROXIME AXETIL 500 MG PO TABS
500.0000 mg | ORAL_TABLET | Freq: Two times a day (BID) | ORAL | Status: DC
  Filled 2021-04-03: qty 1

## 2021-04-03 NOTE — Discharge Summary (Signed)
Physician Discharge Summary   Patient: BRIANNE MAINA MRN: [id number] DOB: [birth year]  Admit date:     04/01/2021  Discharge date: 04/03/21  Discharge Physician: Arnetha Courser   PCP: Lauro Regulus, MD   Recommendations at discharge:  Please check Depakote levels in 3 days Please obtain CBC and BMP in a week Follow-up with neurology and primary care provider.  Discharge Diagnoses: Principal Problem:   Acute metabolic encephalopathy Active Problems:   Essential hypertension, benign   HCAP (healthcare-associated pneumonia)   Hypothyroid   Seizure disorder (HCC)   Hypokalemia   Hospital Course: ZENAIDA TESAR is a 71 y.o. female with medical history significant for stroke with residual left hemiparesis, chronic hypoxic respiratory failure on 3 L continuous nasal cannula, hypertension, seizure disorder, recently diagnosed DVT, now on Eliquis, acquired hypothyroidism, who is admitted to [facility] on 04/01/2021 with acute metabolic encephalopathy after presenting from SNF to [facility] ED for evaluation of altered mental status.   Per rehab note patient was becoming more somnolent and there was some concern of worsening nonproductive cough.  Recent hospitalization for concern of metabolic encephalopathy and underlying bronchitis.  She was treated with Zithromax and cefdinir and completed the course..   On arrival she was hemodynamically stable, saturating well on her baseline oxygen requirement of 3 L. Somnolent with ABGs shows 7.4 2/44/66.  COVID-19 and influenza PCR negative.  No lactic acidosis.  Procalcitonin elevated at 0.61>.0.73.  Chest x-ray with bilateral airspace opacities, left greater than right, concerning for pneumonia.  CT head was negative for any acute abnormality.   Patient was started on cefepime and vancomycin for concern of HCAP.  MRSA swab was negative so we discontinued the vancomycin.  Next morning patient was appeared back to her baseline.  Remained afebrile.  No  leukocytosis.  Her antibiotics were switched with Ceftin for discharge to complete a 5-day course.  She was also found to have lower Depakote levels at 38.  Improved than before but still low.  We increased the dose of Depakote from twice daily to every 8 hourly and she needs her levels rechecked in 3 days.  She needs a close follow-up with her PCP or neurologist to have her antiseizure medications optimize.  She was also found to have mildly elevated blood pressure.  We added losartan to her home dose of metoprolol.  She needs a close follow-up by PCP for further titration of dose.  Patient will continue the rest of her home medications except mentioned above and follow-up with her providers.  Consultants: None Procedures performed: None Disposition: Skilled nursing facility Diet recommendation:  Discharge Diet Orders (From admission, onward)     Start     Ordered   04/03/21 0000  Diet - low sodium heart healthy        04/03/21 1227           Cardiac diet  DISCHARGE MEDICATION: Allergies as of 04/03/2021       Reactions   Amoxicillin Rash   Tolerating rocephin 10/29/15   Ciprofloxacin Anaphylaxis   Clindamycin Hcl Swelling, Rash, Other (See Comments)   Other Reaction: BAD RASH AND SWELLING, SKIN BR Severe rash and swelling. Skin break down.   Penicillins Anaphylaxis   Tolerating rocephin 11/01/15   Codeine Nausea And Vomiting   Baclofen Itching, Rash   Contrast Media [iodinated Contrast Media] Itching, Rash   Erythromycin Rash   Iodides Rash   Povidone iodine; betadine   Latex Itching, Rash   Nickel Rash, Other (  See Comments)   OTHER REACTION   Soap Itching, Rash   Sulfa Antibiotics Rash   Tape Rash   (silk tape only) skin break down   Tolterodine Rash   Vancomycin Rash        Medication List     STOP taking these medications    ARIPiprazole 5 MG tablet Commonly known as: ABILIFY   azithromycin 250 MG tablet Commonly known as: ZITHROMAX   cefdinir 300  MG capsule Commonly known as: OMNICEF   neomycin-bacitracin-polymyxin ointment Commonly known as: NEOSPORIN   omeprazole 10 MG capsule Commonly known as: PRILOSEC       TAKE these medications    apixaban 5 MG Tabs tablet Commonly known as: ELIQUIS Take 1 tablet (5 mg total) by mouth 2 (two) times daily.   atorvastatin 40 MG tablet Commonly known as: LIPITOR Take 40 mg by mouth every morning.   busPIRone 10 MG tablet Commonly known as: BUSPAR Take 10 mg by mouth 2 (two) times daily.   cefUROXime 500 MG tablet Commonly known as: CEFTIN Take 1 tablet (500 mg total) by mouth 2 (two) times daily with a meal for 5 days.   clonazePAM 1 MG tablet Commonly known as: KLONOPIN Take 1 mg by mouth 2 (two) times daily as needed. What changed: Another medication with the same name was removed. Continue taking this medication, and follow the directions you see here.   divalproex 125 MG capsule Commonly known as: DEPAKOTE SPRINKLE Take 4 capsules (500 mg total) by mouth every 8 (eight) hours. What changed: when to take this   FLUoxetine 40 MG capsule Commonly known as: PROZAC Take 40 mg by mouth daily.   guaiFENesin 600 MG 12 hr tablet Commonly known as: MUCINEX Take 2 tablets (1,200 mg total) by mouth 2 (two) times daily as needed. What changed:  when to take this reasons to take this   ipratropium-albuterol 0.5-2.5 (3) MG/3ML Soln Commonly known as: DUONEB Take 3 mLs by nebulization 3 (three) times daily as needed.   levothyroxine 150 MCG tablet Commonly known as: SYNTHROID Take 150 mcg by mouth daily before breakfast.   losartan 25 MG tablet Commonly known as: COZAAR Take 1 tablet (25 mg total) by mouth daily. Start taking on: April 04, 2021   metoprolol tartrate 25 MG tablet Commonly known as: LOPRESSOR Take 25 mg by mouth 2 (two) times daily.   multivitamin with minerals Tabs tablet Take 1 tablet by mouth daily.   nitroGLYCERIN 0.4 MG SL tablet Commonly  known as: NITROSTAT Place 0.4 mg under the tongue every 5 (five) minutes as needed for chest pain.   oxyCODONE-acetaminophen 5-325 MG tablet Commonly known as: PERCOCET/ROXICET Take 1 tablet by mouth 3 (three) times daily.   polyethylene glycol 17 g packet Commonly known as: MIRALAX / GLYCOLAX Take 17 g by mouth at bedtime.   REFRESH P.M. OP Apply 1 inch to eye at bedtime as needed (dry eyes).   risperiDONE 1 MG tablet Commonly known as: RISPERDAL Take 1 mg by mouth at bedtime.   senna 8.6 MG tablet Commonly known as: SENOKOT Take 2 tablets by mouth 2 (two) times daily as needed for constipation.        Follow-up Information     Lauro RegulusAnderson, Marshall W, MD. Schedule an appointment as soon as possible for a visit in 1 week(s).   Specialty: Internal Medicine Contact information: [address] [facility] [place] [id number] [phone]  Discharge Exam: Filed Weights   04/01/21 2208  Weight: 78.7 kg   General.     In no acute distress. Pulmonary.  Lungs clear bilaterally, normal respiratory effort. CV.  Regular rate and rhythm, no JVD, rub or murmur. Abdomen.  Soft, nontender, nondistended, BS positive. CNS.  Alert and oriented .  Left hemiparesis Extremities.  No edema, no cyanosis, pulses intact and symmetrical.  Condition at discharge: stable  The results of significant diagnostics from this hospitalization (including imaging, microbiology, ancillary and laboratory) are listed below for reference.   Imaging Studies: DG Chest 1 View  Result Date: 04/01/2021 CLINICAL DATA:  Shortness of breath.  Fever. EXAM: CHEST  1 VIEW COMPARISON:  Radiograph 02/14/2021 FINDINGS: Asymmetric lung opacities with left increased compared to right, likely combination of layering left pleural effusion and left perihilar airspace disease. There is minimal right infrahilar atelectasis. Heart size upper normal. There is aortic atherosclerosis.  No pneumothorax. IMPRESSION: Asymmetric lung opacities with left increased compared to right, likely combination of layering left pleural effusion and left perihilar airspace disease/pneumonia. Minimal right infrahilar atelectasis. Electronically Signed   By: Narda Rutherford M.D.   On: 04/01/2021 22:36   CT HEAD WO CONTRAST ( )  Result Date: 04/01/2021 CLINICAL DATA:  Mental status change. EXAM: CT HEAD WITHOUT CONTRAST TECHNIQUE: Contiguous axial images were obtained from the base of the skull through the vertex without intravenous contrast. RADIATION DOSE REDUCTION: This exam was performed according to the departmental dose-optimization program which includes automated exposure control, adjustment of the mA and/or kV according to patient size and/or use of iterative reconstruction technique. COMPARISON:  Head CT 10/06/2019 FINDINGS: Brain: No acute intracranial hemorrhage. Multifocal encephalomalacia involving both frontal lobes, right parietal lobe, and right occipital lobe, not significantly changed from prior exam. Stable ventriculomegaly. Periventricular low-density is again seen and unchanged. There is no evidence of acute ischemia. No subdural or extra-axial collection. Vascular: Skull base atherosclerosis without hyperdense vessel. Skull: No fracture or focal lesion. Sinuses/Orbits: Partial opacification of lower left mastoid air cells, chronic and unchanged. No acute findings. Other: None. IMPRESSION: 1. No acute intracranial abnormality. 2. Stable chronic findings including multifocal encephalomalacia and chronic small vessel ischemia, unchanged from prior exam. Electronically Signed   By: Narda Rutherford M.D.   On: 04/01/2021 23:51    Microbiology: Results for orders placed or performed during the hospital encounter of 04/01/21  Blood culture (routine x 2)     Status: None (Preliminary result)   Collection Time: 04/01/21 10:14 PM   Specimen: BLOOD  Result Value Ref Range Status   Specimen  Description BLOOD RIGHT FOREARM  Final   Special Requests   Final    BOTTLES DRAWN AEROBIC AND ANAEROBIC Blood Culture results may not be optimal due to an excessive volume of blood received in culture bottles   Culture   Final    NO GROWTH 2 DAYS Performed at [facility], [address], [place], [place] [address]    Report Status PENDING  Incomplete  Resp Panel by RT-PCR (Flu A&B, Covid) Nasopharyngeal Swab     Status: None   Collection Time: 04/01/21 10:14 PM   Specimen: Nasopharyngeal Swab; Nasopharyngeal(NP) swabs in vial transport medium  Result Value Ref Range Status   SARS Coronavirus 2 by RT PCR NEGATIVE NEGATIVE Final    Comment: (NOTE) SARS-CoV-2 target nucleic acids are NOT DETECTED.  The SARS-CoV-2 RNA is generally detectable in upper respiratory specimens during the acute phase of infection. The lowest concentration of SARS-CoV-2  viral copies this assay can detect is 138 copies/mL. A negative result does not preclude SARS-Cov-2 infection and should not be used as the sole basis for treatment or other patient management decisions. A negative result may occur with  improper specimen collection/handling, submission of specimen other than nasopharyngeal swab, presence of viral mutation(s) within the areas targeted by this assay, and inadequate number of viral copies(<138 copies/mL). A negative result must be combined with clinical observations, patient history, and epidemiological information. The expected result is Negative.  Fact Sheet for Patients:  BloggerCourse.com  Fact Sheet for Healthcare Providers:  SeriousBroker.it  This test is no t yet approved or cleared by the [place] FDA and  has been authorized for detection and/or diagnosis of SARS-CoV-2 by FDA under an Emergency Use Authorization (EUA). This EUA will remain  in effect (meaning this test can be used) for the duration of the COVID-19  declaration under Section 564(b)(1) of the Act, 21 U.S.C.section 360bbb-3(b)(1), unless the authorization is terminated  or revoked sooner.       Influenza A by PCR NEGATIVE NEGATIVE Final   Influenza B by PCR NEGATIVE NEGATIVE Final    Comment: (NOTE) The Xpert Xpress SARS-CoV-2/FLU/RSV plus assay is intended as an aid in the diagnosis of influenza from Nasopharyngeal swab specimens and should not be used as a sole basis for treatment. Nasal washings and aspirates are unacceptable for Xpert Xpress SARS-CoV-2/FLU/RSV testing.  Fact Sheet for Patients: BloggerCourse.com  Fact Sheet for Healthcare Providers: SeriousBroker.it  This test is not yet approved or cleared by the [place] FDA and has been authorized for detection and/or diagnosis of SARS-CoV-2 by FDA under an Emergency Use Authorization (EUA). This EUA will remain in effect (meaning this test can be used) for the duration of the COVID-19 declaration under Section 564(b)(1) of the Act, 21 U.S.C. section 360bbb-3(b)(1), unless the authorization is terminated or revoked.  Performed at [facility], [address] [place], [place] [address]   Blood culture (routine x 2)     Status: None (Preliminary result)   Collection Time: 04/01/21 10:15 PM   Specimen: BLOOD  Result Value Ref Range Status   Specimen Description BLOOD RIGHT ASSIST CONTROL  Final   Special Requests   Final    BOTTLES DRAWN AEROBIC AND ANAEROBIC Blood Culture results may not be optimal due to an excessive volume of blood received in culture bottles   Culture   Final    NO GROWTH 2 DAYS Performed at [facility], [address], [place], [place] [address]    Report Status PENDING  Incomplete  Urine Culture     Status: None   Collection Time: 04/02/21  4:30 AM   Specimen: In/Out Cath Urine  Result Value Ref Range Status   Specimen Description   Final    IN/OUT CATH  URINE Performed at [facility], [address] [place], [place] [address]    Special Requests   Final    NONE Performed at [facility], [address] [place], [place] [address]    Culture   Final    NO GROWTH Performed at [facility], [address] [place], [place] [address]    Report Status 04/03/2021 FINAL  Final  Respiratory (~20 pathogens) panel by PCR     Status: None   Collection Time: 04/02/21  8:23 AM   Specimen: Nasopharyngeal Swab; Respiratory  Result Value Ref Range Status   Adenovirus NOT DETECTED NOT DETECTED Final  Coronavirus 229E NOT DETECTED NOT DETECTED Final    Comment: (NOTE) The Coronavirus on the Respiratory Panel, DOES NOT test for the novel  Coronavirus (2019 nCoV)    Coronavirus HKU1 NOT DETECTED NOT DETECTED Final   Coronavirus NL63 NOT DETECTED NOT DETECTED Final   Coronavirus OC43 NOT DETECTED NOT DETECTED Final   Metapneumovirus NOT DETECTED NOT DETECTED Final   Rhinovirus / Enterovirus NOT DETECTED NOT DETECTED Final   Influenza A NOT DETECTED NOT DETECTED Final   Influenza B NOT DETECTED NOT DETECTED Final   Parainfluenza Virus 1 NOT DETECTED NOT DETECTED Final   Parainfluenza Virus 2 NOT DETECTED NOT DETECTED Final   Parainfluenza Virus 3 NOT DETECTED NOT DETECTED Final   Parainfluenza Virus 4 NOT DETECTED NOT DETECTED Final   Respiratory Syncytial Virus NOT DETECTED NOT DETECTED Final   Bordetella pertussis NOT DETECTED NOT DETECTED Final   Bordetella Parapertussis NOT DETECTED NOT DETECTED Final   Chlamydophila pneumoniae NOT DETECTED NOT DETECTED Final   Mycoplasma pneumoniae NOT DETECTED NOT DETECTED Final    Comment: Performed at [facility], [address] [place], [place] [address]  MRSA Next Gen by PCR, Nasal     Status: None   Collection Time: 04/02/21  8:35 AM   Specimen: Nasopharyngeal Swab; Nasal Swab  Result Value Ref Range Status   MRSA by PCR Next Gen NOT DETECTED NOT DETECTED Final     Comment: (NOTE) The GeneXpert MRSA Assay (FDA approved for NASAL specimens only), is one component of a comprehensive MRSA colonization surveillance program. It is not intended to diagnose MRSA infection nor to guide or monitor treatment for MRSA infections. Test performance is not FDA approved in patients less than 70 years old. Performed at [facility], [address] [place], [place] [address]     Labs: CBC: Recent Labs  Lab 04/01/21 2214 04/02/21 0535  WBC 6.4 8.8  NEUTROABS 3.0 5.4  HGB 11.4* 11.5*  HCT 34.6* 34.8*  MCV 98.3 98.6  PLT 159 163   Basic Metabolic Panel: Recent Labs  Lab 04/01/21 2214 04/02/21 0018 04/02/21 0535  NA 143  --  141  K 3.4*  --  4.1  CL 108  --  108  CO2 27  --  24  GLUCOSE 105*  --  82  BUN 7*  --  7*  CREATININE 0.53  --  0.48  CALCIUM 7.9*  --  7.7*  MG  --  1.9 1.7  PHOS  --   --  3.0   Liver Function Tests: Recent Labs  Lab 04/01/21 2214 04/02/21 0535  AST 10* 13*  ALT 10 8  ALKPHOS 47 40  BILITOT 0.5 1.7*  PROT 5.4* 5.3*  ALBUMIN 2.4* 2.4*   CBG: Recent Labs  Lab 04/02/21 0010  GLUCAP 79    Discharge time spent: greater than 30 minutes.  Signed: Arnetha CourserSumayya Mahamed Zalewski, MD [facility] 04/03/2021

## 2021-04-03 NOTE — [place] FL2 (Signed)
[place] MEDICA[place] FL2 LEVEL OF CARE SCREENING TOOL     IDENTIFICATION  Patient Name: Tammy Shepard Birthdate: August 05, 1950 Sex: female Admission Date (Current Location): 04/01/2021  [facility] and [place] Number:  Chiropodist and Address:  [facility], [address] [place], [place] [address]      Provider Number: [id number]  Attending Physician Name and Address:  Arnetha Courser, MD  Relative Name and Phone Number:  Drewery,Freda (Legal Guardian)   [phone] (Mobile)    Current Level of Care: Hospital Recommended Level of Care: Skilled Nursing Facility Prior Approval Number:    Date Approved/Denied:   PASRR Number: [id number]  Discharge Plan: SNF (Long Term Care)    Current Diagnoses: Patient Active Problem List   Diagnosis Date Noted   Acute metabolic encephalopathy 04/02/2021   Hypokalemia 04/02/2021   Respiratory distress 02/14/2021   DVT (deep venous thrombosis) (HCC) 02/14/2021   Seizure disorder (HCC) 02/14/2021   Pressure injury of skin 09/18/2020   HCAP (healthcare-associated pneumonia) 09/18/2020   Type 2 diabetes mellitus with complication, without long-term current use of insulin (HCC) 04/21/2016   H/O: CVA (cerebrovascular accident) 11/04/2015   Essential hypertension, benign 11/04/2015   Fever in adult    Generalized weakness    Recurrent UTI 10/31/2015   Fever 10/31/2015   UTI (lower urinary tract infection) 10/31/2015   Sacral ulcer (HCC) 06/29/2014   Hypothyroid 11/28/2012    Orientation RESPIRATION BLADDER Height & Weight     Self, Time  O2 (Intermittent oxygen use during stay) Incontinent Weight: 78.7 kg Height:  5\' 4"  (162.6 cm)  BEHAVIORAL SYMPTOMS/MOOD NEUROLOGICAL BOWEL NUTRITION STATUS      Continent Diet (Heart)  AMBULATORY STATUS COMMUNICATION OF NEEDS Skin   Total Care Verbally Normal                       Personal Care Assistance Level of Assistance  Bathing, Feeding, Dressing  Bathing Assistance: Maximum assistance Feeding assistance: Maximum assistance Dressing Assistance: Maximum assistance     Functional Limitations Info  Sight, Hearing, Speech Sight Info: Impaired Hearing Info: Adequate Speech Info: Adequate    SPECIAL CARE FACTORS FREQUENCY                       Contractures Contractures Info: Present    Additional Factors Info  Code Status, Allergies Code Status Info: DNR Allergies Info: Amoxicillin          rocephin          Ciprofloxacin     Clindamycin Hcl     Penicillins          Anaphylaxis           Tolerating rocephin 11/01/15           Baclofen           Contrast Media (Iodinated Contrast Media)         Erythromycin       Iodides          Povidone iodine; betadine           Latex         Nickel         REACTION           Soap          Itching, Rash     Low     Allergy     08/03/2012  Sulfa Antibiotics          Rash     Low     Allergy     08/03/2012                            Tape               Tolterodine       Vancomycin            Codeine           Current Medications (04/03/2021):  This is the current hospital active medication list Current Facility-Administered Medications  Medication Dose Route Frequency Provider Last Rate Last Admin   acetaminophen (TYLENOL) tablet 650 mg  650 mg Oral Q6H PRN Howerter, Justin B, DO   650 mg at 04/03/21 1154   Or   acetaminophen (TYLENOL) suppository 650 mg  650 mg Rectal Q6H PRN Howerter, Justin B, DO       apixaban (ELIQUIS) tablet 5 mg  5 mg Oral BID Howerter, Justin B, DO   5 mg at 04/03/21 1045   busPIRone (BUSPAR) tablet 10 mg  10 mg Oral BID Arnetha Courser, MD   10 mg at 04/03/21 1046   cefUROXime (CEFTIN) tablet 500 mg  500 mg Oral BID WC Arnetha Courser, MD       clonazePAM (KLONOPIN) tablet 1 mg  1 mg Oral BID PRN Arnetha Courser, MD   1 mg at 04/03/21 1046   divalproex (DEPAKOTE SPRINKLE) capsule 500 mg  500 mg Oral Q8H Amin, Tilman Neat, MD   500 mg at 04/03/21 0534    FLUoxetine (PROZAC) capsule 40 mg  40 mg Oral Daily Arnetha Courser, MD   40 mg at 04/03/21 1045   ipratropium-albuterol (DUONEB) 0.5-2.5 (3) MG/3ML nebulizer solution 3 mL  3 mL Nebulization TID PRN Arnetha Courser, MD       labetalol (NORMODYNE) injection 10 mg  10 mg Intravenous Q2H PRN Arnetha Courser, MD   10 mg at 04/02/21 9030   levothyroxine (SYNTHROID) tablet 150 mcg  150 mcg Oral Q0600 Howerter, Justin B, DO   150 mcg at 04/03/21 0533   losartan (COZAAR) tablet 25 mg  25 mg Oral Daily Arnetha Courser, MD   25 mg at 04/03/21 1046   metoprolol tartrate (LOPRESSOR) tablet 25 mg  25 mg Oral BID Arnetha Courser, MD   25 mg at 04/03/21 1045   multivitamin with minerals tablet 1 tablet  1 tablet Oral Daily Arnetha Courser, MD   1 tablet at 04/03/21 1045   polyethylene glycol (MIRALAX / GLYCOLAX) packet 17 g  17 g Oral QHS Arnetha Courser, MD   17 g at 04/02/21 2045   polyvinyl alcohol (LIQUIFILM TEARS) 1.4 % ophthalmic solution 1 drop  1 drop Both Eyes QHS PRN Arnetha Courser, MD       risperiDONE (RISPERDAL) tablet 1 mg  1 mg Oral QHS Arnetha Courser, MD   1 mg at 04/02/21 2045     Discharge Medications: Please see discharge summary for a list of discharge medications.  Relevant Imaging Results:  Relevant Lab Results:   Additional Information    Caryn Section, RN

## 2021-04-03 NOTE — Plan of Care (Signed)

## 2021-04-03 NOTE — TOC Progression Note (Addendum)
Transition of Care [facility]) - Progression Note    Patient Details  Name: MIKAELA HILGEMAN MRN: [id number] Date of Birth: 1950-10-02  Transition of [facility] CM/SW Contact  Caryn Section, RN Phone Number: 04/03/2021, 1:32 PM  Clinical Narrative:   Patient is long term care resident with [facility]   As per Verlon Au, patient can return today.  Patient is #6 on EMS list to transport.  Addendum 1402:  Contacted guardian, however voicemail was full unable to leave message.  Expected Discharge Plan: Skilled Nursing Facility Barriers to Discharge: Continued Medical Work up  Expected Discharge Plan and Services Expected Discharge Plan: Skilled Nursing Facility   Discharge Planning Services: CM Consult Post Acute Care Choice: Skilled Nursing Facility Living arrangements for the past 2 months: Skilled Nursing Facility Expected Discharge Date: 04/03/21                                     Social Determinants of Health (SDOH) Interventions    Readmission Risk Interventions Readmission Risk Prevention Plan 04/02/2021  Transportation Screening Complete  PCP or Specialist Appt within 3-5 Days Complete  HRI or Home Care Consult Not Complete  HRI or Home Care Consult comments Patient is a resident of SNF  Social Work Consult for Recovery Care Planning/Counseling Not Complete  SW consult not completed comments RNCM assigned to case  Palliative Care Screening Not Applicable  Medication Review (RN Care Manager) Complete  Some recent data might be hidden

## 2021-04-03 NOTE — Progress Notes (Signed)
SLP Cancellation Note  Patient Details Name: Tammy Shepard MRN: [id number] DOB: Dec 20, 1950   Cancelled treatment:       Reason Eval/Treat Not Completed: SLP screened, no needs identified, will sign off (chart reviewed; consulted NSG re: pt's status today) Patient is long term care resident with [facility]. Pt's history significant for stroke with residual left hemiparesis, Dysphagia and on Nectar consistency liquids at her Baseline, chronic hypoxic respiratory failure on 3 L continuous nasal cannula, hypertension, seizure disorder, recently diagnosed DVT. She was admitted w/ Bilateral pneumonia, pleural effusion, and acute metabolic encephalopathy. NSG stated pt had mild "coughing" last night, but has had NO difficulty swallowing Nectar liquids or foods at meals(2) nor w/ Pills today. Pills are given in applesauce. NSG stated pt is d/t discharge back to her NH this afternoon and is on her Baseline Dysphagia diet doing well; NSG had no swallowing concerns. Suspect impact from acuity of illness, hospitalization.   Recommend pt continue w/ her Baseline Dysphagia diet including Nectar liquids; general aspiration precautions; Pills in Puree. If any further concern of oropharyngeal phase swallowing issues, then pt can be referred for an Outpatient MBSS from her NH post resolution of Acuity of illness. Recommend monitoring of Pulmonary status and for any negative sequelae of aspiration at her facility.      Jerilynn Som, MS, CCC-SLP Speech Language Pathologist Rehab Services; [facility] [phone] (ascom) Marguis Mathieson 04/03/2021, 1:37 PM

## 2021-04-03 NOTE — Progress Notes (Signed)
Nutrition Brief Note  Patient identified on the Malnutrition Screening Tool (MST) Report  Wt Readings from Last 15 Encounters:  04/01/21 78.7 kg  02/14/21 72 kg  09/18/20 90.7 kg  08/06/19 71.2 kg  05/07/17 72.1 kg  08/29/16 71.1 kg  02/28/16 74.3 kg  11/01/15 78 kg  08/31/15 79.4 kg   Tammy Shepard is a 71 y.o. female with medical history significant for stroke with residual left hemiparesis, chronic hypoxic respiratory failure on 3 L continuous nasal cannula, hypertension, seizure disorder, recently diagnosed DVT, now on Eliquis, acquired hypothyroidism, who is admitted to [facility] on 04/01/2021 with acute metabolic encephalopathy after presenting from SNF to [facility] ED for evaluation of altered mental status.  Pt admitted with acute metabolic encephalopathy and HCAP.  Spoke with pt at bedside, who reports feeling better today. She was able to answer simple questions. She shares that she usually has a good appetite, however, it was decreased about one week PTA. Pt is  a resident of [facility].   Per discussion with RN and nurse tech, pt with good appetite, consumed all of her eggs and blueberry muffin without difficulty.   Plan to d/c back to [facility] today.   Nutrition-Focused physical exam completed. Findings are no fat depletion, no muscle depletion, and mild edema.    Current diet order is Heart Healthy with nectar thick liquids, patient is consuming approximately n/a% of meals at this time. Labs and medications reviewed.   No nutrition interventions warranted at this time. If nutrition issues arise, please consult RD.   Levada Schilling, RD, LDN, CDCES Registered Dietitian II Certified Diabetes Care and Education Specialist Please refer to [facility] for RD and/or RD on-call/weekend/after hours pager

## 2021-04-06 LAB — CULTURE, BLOOD (ROUTINE X 2)
Culture: NO GROWTH
Culture: NO GROWTH

## 2021-04-15 LAB — METHYLMALONIC ACID, SERUM: Methylmalonic Acid, Quantitative: 288 nmol/L (ref 0–378)

## 2021-06-08 DEATH — deceased

## 2023-11-20 IMAGING — CT CT HEAD W/O CM
4 series · 16 of 47 positions shown, 18 images · non-contrast
Comparison: Head CT 10/06/2019

CLINICAL DATA: Mental status change.



[Series 2: head bone · axial · 0.44mm/px · z∈[+1159,+1189]mm · 3 of 76 slices shown]
[im 8/76  bone]
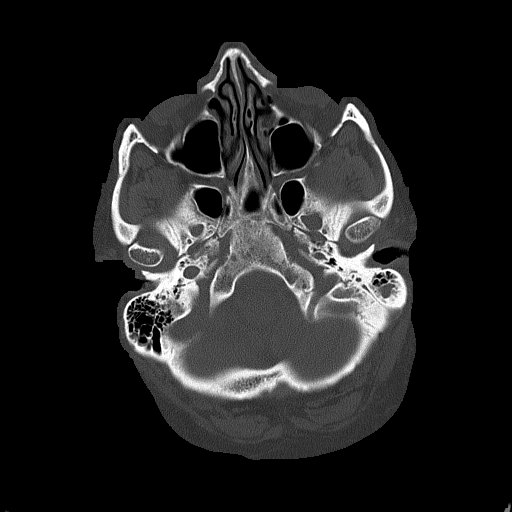
[im 16/76  bone]
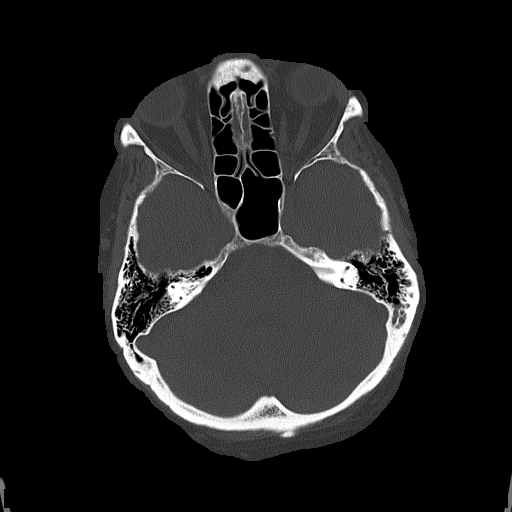
[im 23/76  bone]
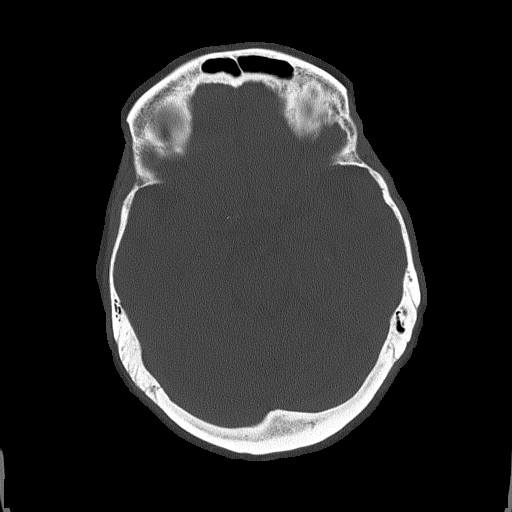

[Series 3: head wo · axial · 0.44mm/px · z∈[+1160,+1275]mm · 7 of 31 slices shown, 9 images]
[im 4/31  brain]
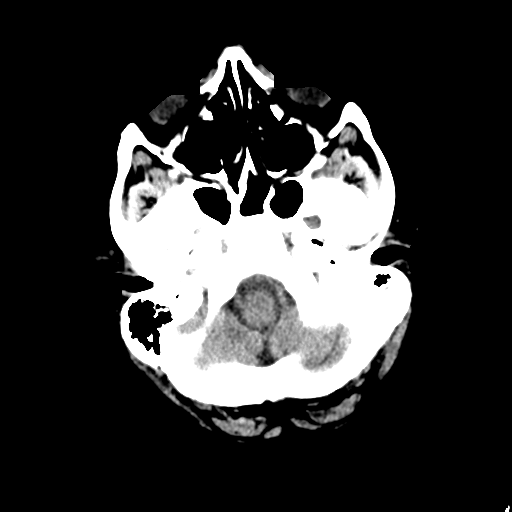
[im 4/31  bone]
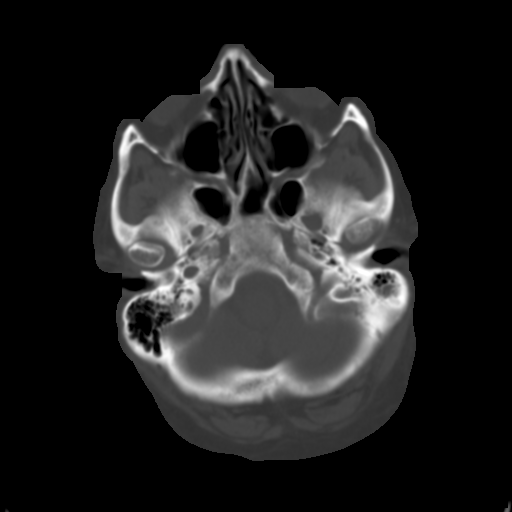
[im 8/31  brain]
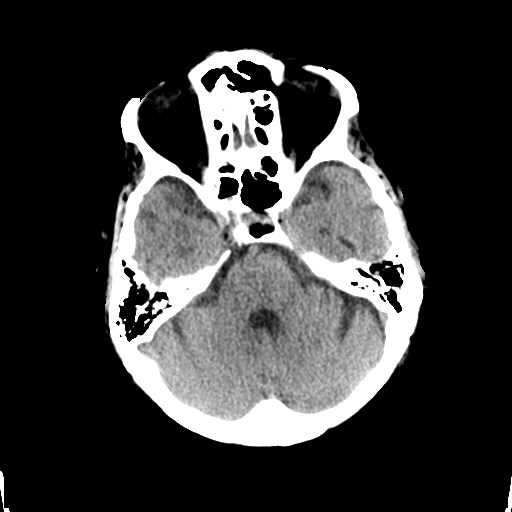
[im 12/31  brain]
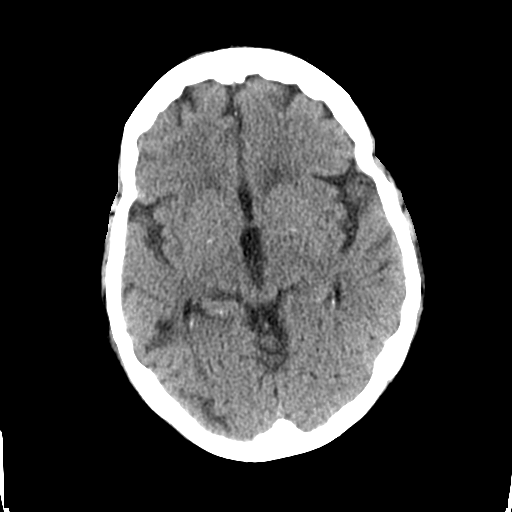
[im 16/31  brain]
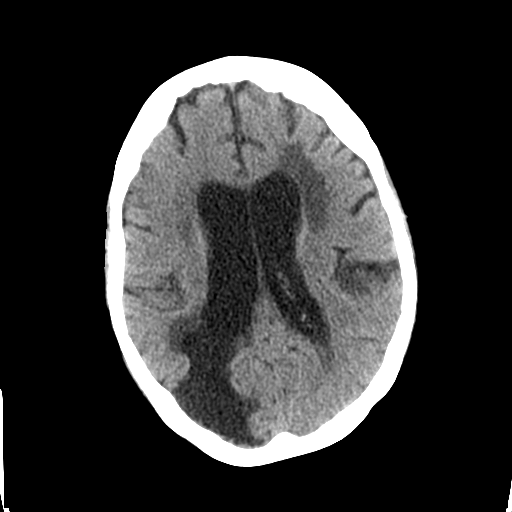
[im 19/31  brain]
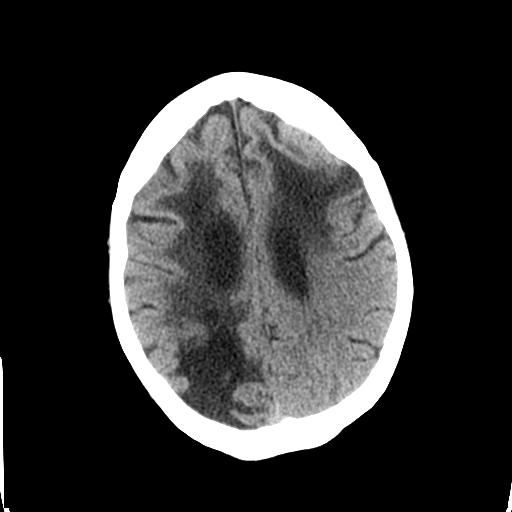
[im 19/31  bone]
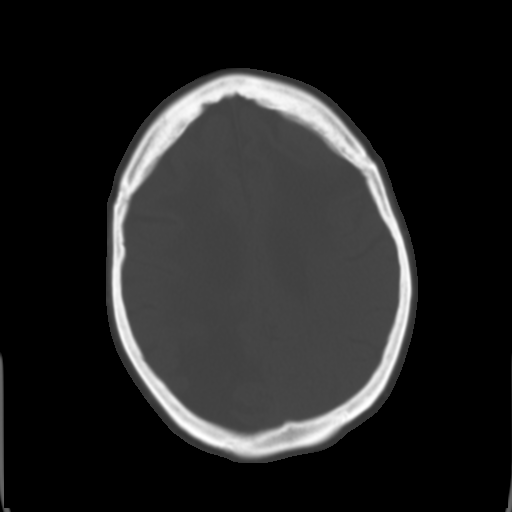
[im 23/31  brain]
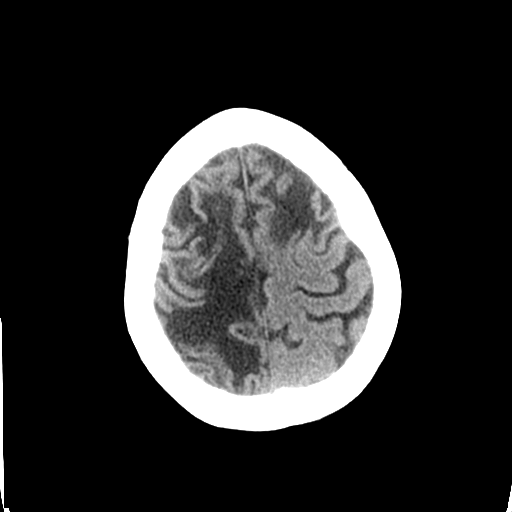
[im 27/31  brain]
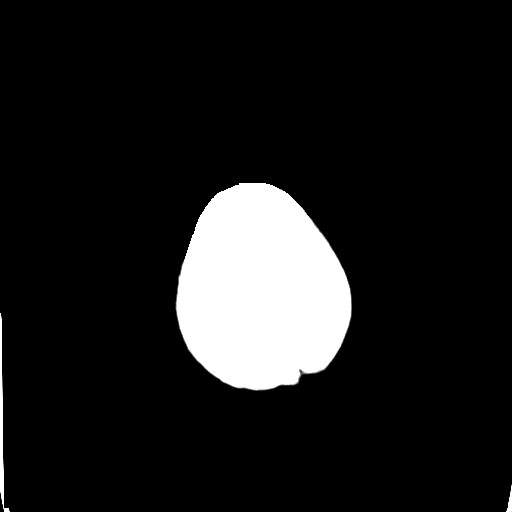

[Series 4: coronal soft tissue · coronal · 0.30mm/px · 3 of 65 slices shown]
[im 22/65  brain]
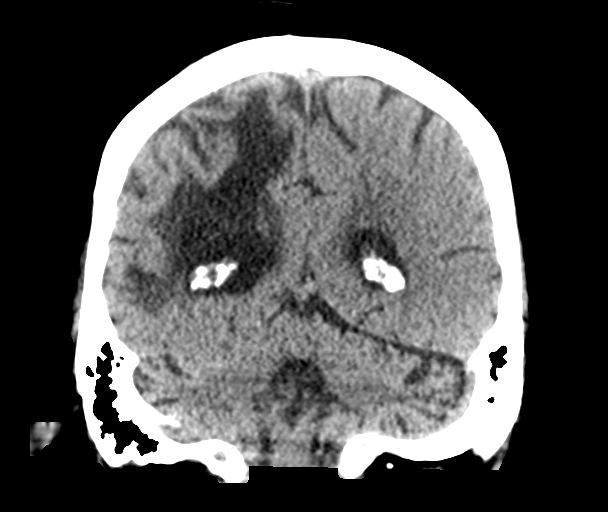
[im 29/65  brain]
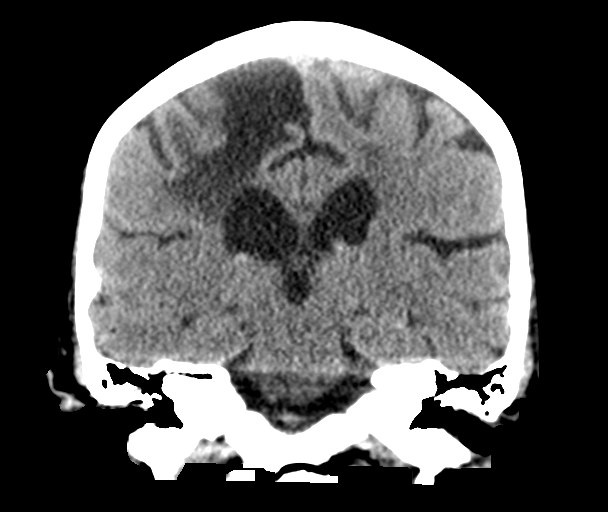
[im 36/65  brain]
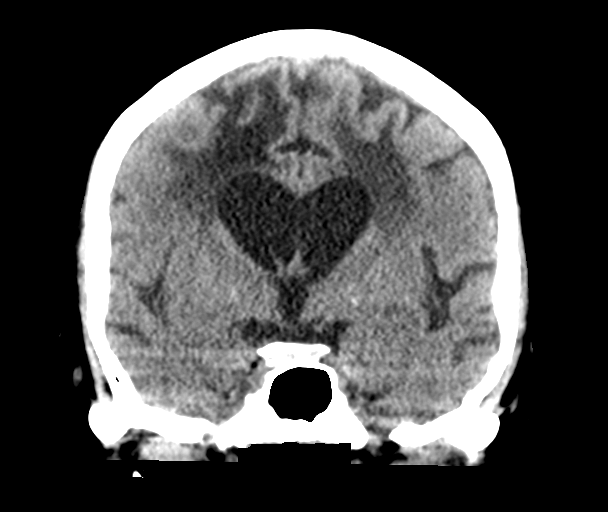

[Series 5: sagittal soft tissue · sagittal · 0.30mm/px · 3 of 55 slices shown]
[im 19/55  brain]
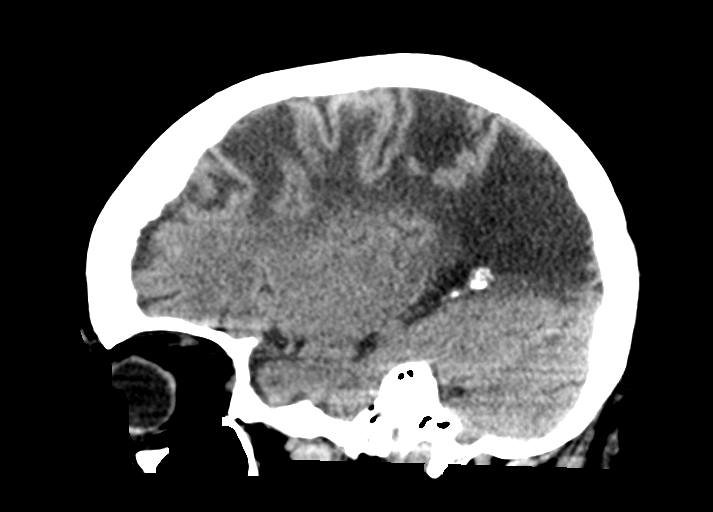
[im 28/55  brain]
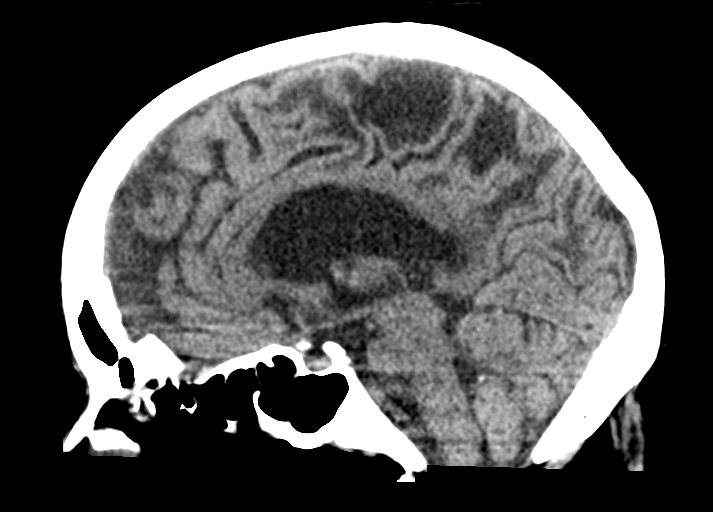
[im 37/55  brain]
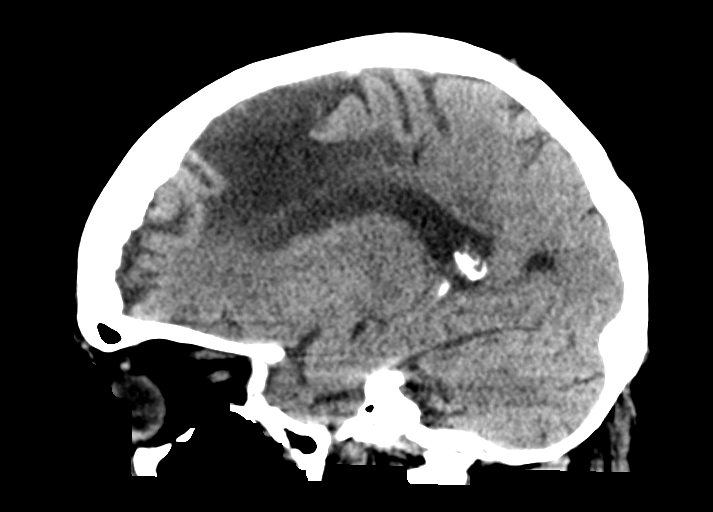

[16 of 47 positions shown; findings below may reference images not displayed]

FINDINGS: Brain: No acute intracranial hemorrhage. Multifocal encephalomalacia
involving both frontal lobes, right parietal lobe, and right
occipital lobe, not significantly changed from prior exam. Stable
ventriculomegaly. Periventricular low-density is again seen and
unchanged. There is no evidence of acute ischemia. No subdural or
extra-axial collection.

Vascular: Skull base atherosclerosis without hyperdense vessel.

Skull: No fracture or focal lesion.

Sinuses/Orbits: Partial opacification of lower left mastoid air
cells, chronic and unchanged. No acute findings.

Other: None.
IMPRESSION: 1. No acute intracranial abnormality.
2. Stable chronic findings including multifocal encephalomalacia and
chronic small vessel ischemia, unchanged from prior exam.
